# Patient Record
Sex: Male | Born: 1945
Health system: Southern US, Community
[De-identification: ages and names within clinical notes are randomized; demographics above are authoritative.]

## PROBLEM LIST (undated history)

## (undated) DIAGNOSIS — Z22322 Carrier or suspected carrier of Methicillin resistant Staphylococcus aureus: Secondary | ICD-10-CM

## (undated) DIAGNOSIS — E119 Type 2 diabetes mellitus without complications: Secondary | ICD-10-CM

## (undated) DIAGNOSIS — I10 Essential (primary) hypertension: Secondary | ICD-10-CM

## (undated) DIAGNOSIS — E785 Hyperlipidemia, unspecified: Secondary | ICD-10-CM

## (undated) DIAGNOSIS — I4891 Unspecified atrial fibrillation: Secondary | ICD-10-CM

## (undated) HISTORY — DX: Essential (primary) hypertension: I10

## (undated) HISTORY — DX: Unspecified atrial fibrillation: I48.91

## (undated) HISTORY — DX: Hyperlipidemia, unspecified: E78.5

## (undated) HISTORY — DX: Carrier or suspected carrier of methicillin resistant Staphylococcus aureus: Z22.322

## (undated) HISTORY — PX: NO PAST SURGERIES: SHX2092

## (undated) HISTORY — DX: Type 2 diabetes mellitus without complications: E11.9

---

## 2013-10-19 DIAGNOSIS — E119 Type 2 diabetes mellitus without complications: Secondary | ICD-10-CM | POA: Diagnosis not present

## 2013-10-19 DIAGNOSIS — I10 Essential (primary) hypertension: Secondary | ICD-10-CM | POA: Diagnosis not present

## 2013-10-19 DIAGNOSIS — R0602 Shortness of breath: Secondary | ICD-10-CM | POA: Diagnosis not present

## 2013-10-19 DIAGNOSIS — I4891 Unspecified atrial fibrillation: Secondary | ICD-10-CM | POA: Diagnosis not present

## 2013-10-20 DIAGNOSIS — J90 Pleural effusion, not elsewhere classified: Secondary | ICD-10-CM | POA: Diagnosis not present

## 2013-10-20 DIAGNOSIS — M545 Low back pain, unspecified: Secondary | ICD-10-CM | POA: Diagnosis not present

## 2013-10-20 DIAGNOSIS — I10 Essential (primary) hypertension: Secondary | ICD-10-CM | POA: Diagnosis not present

## 2013-10-20 DIAGNOSIS — J9819 Other pulmonary collapse: Secondary | ICD-10-CM | POA: Diagnosis not present

## 2013-10-20 DIAGNOSIS — I311 Chronic constrictive pericarditis: Secondary | ICD-10-CM | POA: Diagnosis not present

## 2013-10-20 DIAGNOSIS — E119 Type 2 diabetes mellitus without complications: Secondary | ICD-10-CM | POA: Diagnosis not present

## 2013-10-20 DIAGNOSIS — E785 Hyperlipidemia, unspecified: Secondary | ICD-10-CM | POA: Diagnosis not present

## 2013-10-20 DIAGNOSIS — I319 Disease of pericardium, unspecified: Secondary | ICD-10-CM | POA: Diagnosis not present

## 2013-10-20 DIAGNOSIS — R0602 Shortness of breath: Secondary | ICD-10-CM | POA: Diagnosis not present

## 2013-10-20 DIAGNOSIS — I4891 Unspecified atrial fibrillation: Secondary | ICD-10-CM | POA: Diagnosis not present

## 2013-10-21 DIAGNOSIS — R0602 Shortness of breath: Secondary | ICD-10-CM | POA: Diagnosis not present

## 2013-10-21 DIAGNOSIS — I311 Chronic constrictive pericarditis: Secondary | ICD-10-CM | POA: Diagnosis not present

## 2013-10-21 DIAGNOSIS — Z0000 Encounter for general adult medical examination without abnormal findings: Secondary | ICD-10-CM | POA: Diagnosis not present

## 2013-10-21 DIAGNOSIS — I4891 Unspecified atrial fibrillation: Secondary | ICD-10-CM | POA: Diagnosis not present

## 2013-10-21 DIAGNOSIS — J90 Pleural effusion, not elsewhere classified: Secondary | ICD-10-CM | POA: Diagnosis not present

## 2013-10-22 DIAGNOSIS — J90 Pleural effusion, not elsewhere classified: Secondary | ICD-10-CM | POA: Diagnosis not present

## 2013-10-22 DIAGNOSIS — E119 Type 2 diabetes mellitus without complications: Secondary | ICD-10-CM | POA: Diagnosis not present

## 2013-10-22 DIAGNOSIS — Z794 Long term (current) use of insulin: Secondary | ICD-10-CM | POA: Diagnosis not present

## 2013-10-22 DIAGNOSIS — Z7982 Long term (current) use of aspirin: Secondary | ICD-10-CM | POA: Diagnosis not present

## 2013-10-22 DIAGNOSIS — Z79899 Other long term (current) drug therapy: Secondary | ICD-10-CM | POA: Diagnosis not present

## 2013-10-22 DIAGNOSIS — Z9889 Other specified postprocedural states: Secondary | ICD-10-CM | POA: Diagnosis not present

## 2013-10-22 DIAGNOSIS — I4891 Unspecified atrial fibrillation: Secondary | ICD-10-CM | POA: Diagnosis not present

## 2013-10-22 DIAGNOSIS — I311 Chronic constrictive pericarditis: Secondary | ICD-10-CM | POA: Diagnosis not present

## 2013-10-22 DIAGNOSIS — R899 Unspecified abnormal finding in specimens from other organs, systems and tissues: Secondary | ICD-10-CM | POA: Diagnosis not present

## 2013-10-22 DIAGNOSIS — J9819 Other pulmonary collapse: Secondary | ICD-10-CM | POA: Diagnosis not present

## 2013-10-29 DIAGNOSIS — R0602 Shortness of breath: Secondary | ICD-10-CM | POA: Diagnosis not present

## 2013-10-29 DIAGNOSIS — J90 Pleural effusion, not elsewhere classified: Secondary | ICD-10-CM | POA: Diagnosis not present

## 2013-10-29 DIAGNOSIS — I4891 Unspecified atrial fibrillation: Secondary | ICD-10-CM | POA: Diagnosis not present

## 2013-11-23 DIAGNOSIS — J90 Pleural effusion, not elsewhere classified: Secondary | ICD-10-CM | POA: Diagnosis not present

## 2013-11-23 DIAGNOSIS — I4891 Unspecified atrial fibrillation: Secondary | ICD-10-CM | POA: Diagnosis not present

## 2013-11-23 DIAGNOSIS — I319 Disease of pericardium, unspecified: Secondary | ICD-10-CM | POA: Diagnosis not present

## 2013-11-23 DIAGNOSIS — E119 Type 2 diabetes mellitus without complications: Secondary | ICD-10-CM | POA: Diagnosis not present

## 2013-11-23 DIAGNOSIS — E785 Hyperlipidemia, unspecified: Secondary | ICD-10-CM | POA: Diagnosis not present

## 2013-11-24 DIAGNOSIS — I4891 Unspecified atrial fibrillation: Secondary | ICD-10-CM | POA: Diagnosis not present

## 2013-11-29 DIAGNOSIS — I4891 Unspecified atrial fibrillation: Secondary | ICD-10-CM | POA: Diagnosis not present

## 2013-11-29 DIAGNOSIS — I10 Essential (primary) hypertension: Secondary | ICD-10-CM | POA: Diagnosis not present

## 2013-12-13 DIAGNOSIS — I10 Essential (primary) hypertension: Secondary | ICD-10-CM | POA: Diagnosis not present

## 2013-12-13 DIAGNOSIS — I4891 Unspecified atrial fibrillation: Secondary | ICD-10-CM | POA: Diagnosis not present

## 2014-01-03 DIAGNOSIS — I4891 Unspecified atrial fibrillation: Secondary | ICD-10-CM | POA: Diagnosis not present

## 2014-01-03 DIAGNOSIS — E039 Hypothyroidism, unspecified: Secondary | ICD-10-CM | POA: Diagnosis not present

## 2014-01-03 DIAGNOSIS — I10 Essential (primary) hypertension: Secondary | ICD-10-CM | POA: Diagnosis not present

## 2014-01-09 DIAGNOSIS — H00019 Hordeolum externum unspecified eye, unspecified eyelid: Secondary | ICD-10-CM | POA: Diagnosis not present

## 2014-02-01 DIAGNOSIS — E119 Type 2 diabetes mellitus without complications: Secondary | ICD-10-CM | POA: Diagnosis not present

## 2014-02-01 DIAGNOSIS — J90 Pleural effusion, not elsewhere classified: Secondary | ICD-10-CM | POA: Diagnosis not present

## 2014-02-01 DIAGNOSIS — I4891 Unspecified atrial fibrillation: Secondary | ICD-10-CM | POA: Diagnosis not present

## 2014-02-01 DIAGNOSIS — I10 Essential (primary) hypertension: Secondary | ICD-10-CM | POA: Diagnosis not present

## 2014-02-01 DIAGNOSIS — E785 Hyperlipidemia, unspecified: Secondary | ICD-10-CM | POA: Diagnosis not present

## 2014-03-04 DIAGNOSIS — E119 Type 2 diabetes mellitus without complications: Secondary | ICD-10-CM | POA: Diagnosis not present

## 2014-03-04 DIAGNOSIS — H2510 Age-related nuclear cataract, unspecified eye: Secondary | ICD-10-CM | POA: Diagnosis not present

## 2014-03-04 DIAGNOSIS — H25019 Cortical age-related cataract, unspecified eye: Secondary | ICD-10-CM | POA: Diagnosis not present

## 2014-03-04 DIAGNOSIS — Z79899 Other long term (current) drug therapy: Secondary | ICD-10-CM | POA: Diagnosis not present

## 2014-04-06 DIAGNOSIS — E119 Type 2 diabetes mellitus without complications: Secondary | ICD-10-CM | POA: Diagnosis not present

## 2014-04-06 DIAGNOSIS — I4891 Unspecified atrial fibrillation: Secondary | ICD-10-CM | POA: Diagnosis not present

## 2014-04-06 DIAGNOSIS — I10 Essential (primary) hypertension: Secondary | ICD-10-CM | POA: Diagnosis not present

## 2014-05-30 DIAGNOSIS — I4891 Unspecified atrial fibrillation: Secondary | ICD-10-CM | POA: Diagnosis not present

## 2014-05-30 DIAGNOSIS — J90 Pleural effusion, not elsewhere classified: Secondary | ICD-10-CM | POA: Diagnosis not present

## 2014-07-14 DIAGNOSIS — I10 Essential (primary) hypertension: Secondary | ICD-10-CM | POA: Diagnosis not present

## 2014-07-14 DIAGNOSIS — E785 Hyperlipidemia, unspecified: Secondary | ICD-10-CM | POA: Diagnosis not present

## 2014-07-14 DIAGNOSIS — E119 Type 2 diabetes mellitus without complications: Secondary | ICD-10-CM | POA: Diagnosis not present

## 2014-08-02 DIAGNOSIS — M238X9 Other internal derangements of unspecified knee: Secondary | ICD-10-CM | POA: Diagnosis not present

## 2014-08-12 DIAGNOSIS — I10 Essential (primary) hypertension: Secondary | ICD-10-CM | POA: Diagnosis not present

## 2014-08-12 DIAGNOSIS — I319 Disease of pericardium, unspecified: Secondary | ICD-10-CM | POA: Diagnosis not present

## 2014-08-12 DIAGNOSIS — E785 Hyperlipidemia, unspecified: Secondary | ICD-10-CM | POA: Diagnosis not present

## 2014-08-12 DIAGNOSIS — I4891 Unspecified atrial fibrillation: Secondary | ICD-10-CM | POA: Diagnosis not present

## 2014-08-12 DIAGNOSIS — E119 Type 2 diabetes mellitus without complications: Secondary | ICD-10-CM | POA: Diagnosis not present

## 2014-10-03 DIAGNOSIS — Z1389 Encounter for screening for other disorder: Secondary | ICD-10-CM | POA: Diagnosis not present

## 2014-10-03 DIAGNOSIS — E1140 Type 2 diabetes mellitus with diabetic neuropathy, unspecified: Secondary | ICD-10-CM | POA: Diagnosis not present

## 2014-10-03 DIAGNOSIS — I4891 Unspecified atrial fibrillation: Secondary | ICD-10-CM | POA: Diagnosis not present

## 2014-10-03 DIAGNOSIS — E039 Hypothyroidism, unspecified: Secondary | ICD-10-CM | POA: Diagnosis not present

## 2014-10-03 DIAGNOSIS — I10 Essential (primary) hypertension: Secondary | ICD-10-CM | POA: Diagnosis not present

## 2014-10-03 DIAGNOSIS — R3911 Hesitancy of micturition: Secondary | ICD-10-CM | POA: Diagnosis not present

## 2014-10-03 DIAGNOSIS — Z23 Encounter for immunization: Secondary | ICD-10-CM | POA: Diagnosis not present

## 2015-01-11 DIAGNOSIS — I482 Chronic atrial fibrillation: Secondary | ICD-10-CM | POA: Diagnosis not present

## 2015-01-11 DIAGNOSIS — E1165 Type 2 diabetes mellitus with hyperglycemia: Secondary | ICD-10-CM | POA: Diagnosis not present

## 2015-01-11 DIAGNOSIS — E039 Hypothyroidism, unspecified: Secondary | ICD-10-CM | POA: Diagnosis not present

## 2015-03-15 DIAGNOSIS — R001 Bradycardia, unspecified: Secondary | ICD-10-CM | POA: Diagnosis not present

## 2015-03-15 DIAGNOSIS — I480 Paroxysmal atrial fibrillation: Secondary | ICD-10-CM | POA: Diagnosis not present

## 2015-03-15 DIAGNOSIS — E119 Type 2 diabetes mellitus without complications: Secondary | ICD-10-CM | POA: Diagnosis not present

## 2015-03-27 DIAGNOSIS — I4891 Unspecified atrial fibrillation: Secondary | ICD-10-CM | POA: Diagnosis not present

## 2015-03-29 DIAGNOSIS — I739 Peripheral vascular disease, unspecified: Secondary | ICD-10-CM | POA: Diagnosis not present

## 2015-03-29 DIAGNOSIS — I481 Persistent atrial fibrillation: Secondary | ICD-10-CM | POA: Diagnosis not present

## 2015-03-29 DIAGNOSIS — R001 Bradycardia, unspecified: Secondary | ICD-10-CM | POA: Diagnosis not present

## 2015-03-29 DIAGNOSIS — I491 Atrial premature depolarization: Secondary | ICD-10-CM | POA: Diagnosis not present

## 2015-04-12 DIAGNOSIS — E119 Type 2 diabetes mellitus without complications: Secondary | ICD-10-CM | POA: Diagnosis not present

## 2015-04-12 DIAGNOSIS — I4891 Unspecified atrial fibrillation: Secondary | ICD-10-CM | POA: Diagnosis not present

## 2015-04-13 DIAGNOSIS — L03011 Cellulitis of right finger: Secondary | ICD-10-CM | POA: Diagnosis not present

## 2015-04-18 DIAGNOSIS — L0390 Cellulitis, unspecified: Secondary | ICD-10-CM | POA: Diagnosis not present

## 2015-04-18 DIAGNOSIS — T148 Other injury of unspecified body region: Secondary | ICD-10-CM | POA: Diagnosis not present

## 2015-04-19 DIAGNOSIS — T148 Other injury of unspecified body region: Secondary | ICD-10-CM | POA: Diagnosis not present

## 2015-04-19 DIAGNOSIS — L0390 Cellulitis, unspecified: Secondary | ICD-10-CM | POA: Diagnosis not present

## 2015-04-19 DIAGNOSIS — S61031A Puncture wound without foreign body of right thumb without damage to nail, initial encounter: Secondary | ICD-10-CM | POA: Diagnosis not present

## 2015-04-19 DIAGNOSIS — M19041 Primary osteoarthritis, right hand: Secondary | ICD-10-CM | POA: Diagnosis not present

## 2015-04-20 DIAGNOSIS — L0390 Cellulitis, unspecified: Secondary | ICD-10-CM | POA: Diagnosis not present

## 2015-04-20 DIAGNOSIS — M795 Residual foreign body in soft tissue: Secondary | ICD-10-CM | POA: Diagnosis not present

## 2015-04-24 DIAGNOSIS — S61021A Laceration with foreign body of right thumb without damage to nail, initial encounter: Secondary | ICD-10-CM | POA: Diagnosis not present

## 2015-05-15 DIAGNOSIS — E119 Type 2 diabetes mellitus without complications: Secondary | ICD-10-CM

## 2015-05-15 DIAGNOSIS — E7800 Pure hypercholesterolemia, unspecified: Secondary | ICD-10-CM

## 2015-05-15 DIAGNOSIS — I480 Paroxysmal atrial fibrillation: Secondary | ICD-10-CM

## 2015-05-15 DIAGNOSIS — I10 Essential (primary) hypertension: Secondary | ICD-10-CM | POA: Insufficient documentation

## 2015-05-15 HISTORY — DX: Pure hypercholesterolemia, unspecified: E78.00

## 2015-05-15 HISTORY — DX: Essential (primary) hypertension: I10

## 2015-05-15 HISTORY — DX: Type 2 diabetes mellitus without complications: E11.9

## 2015-05-15 HISTORY — DX: Paroxysmal atrial fibrillation: I48.0

## 2015-07-13 DIAGNOSIS — I4891 Unspecified atrial fibrillation: Secondary | ICD-10-CM | POA: Diagnosis not present

## 2015-07-13 DIAGNOSIS — I10 Essential (primary) hypertension: Secondary | ICD-10-CM | POA: Diagnosis not present

## 2015-07-13 DIAGNOSIS — E1165 Type 2 diabetes mellitus with hyperglycemia: Secondary | ICD-10-CM | POA: Diagnosis not present

## 2015-07-17 DIAGNOSIS — Z794 Long term (current) use of insulin: Secondary | ICD-10-CM | POA: Diagnosis not present

## 2015-08-17 DIAGNOSIS — E11329 Type 2 diabetes mellitus with mild nonproliferative diabetic retinopathy without macular edema: Secondary | ICD-10-CM | POA: Diagnosis not present

## 2015-08-23 DIAGNOSIS — J0100 Acute maxillary sinusitis, unspecified: Secondary | ICD-10-CM | POA: Diagnosis not present

## 2015-10-26 DIAGNOSIS — E7800 Pure hypercholesterolemia, unspecified: Secondary | ICD-10-CM | POA: Diagnosis not present

## 2015-10-26 DIAGNOSIS — E119 Type 2 diabetes mellitus without complications: Secondary | ICD-10-CM | POA: Diagnosis not present

## 2015-10-26 DIAGNOSIS — I10 Essential (primary) hypertension: Secondary | ICD-10-CM | POA: Diagnosis not present

## 2015-10-26 DIAGNOSIS — I480 Paroxysmal atrial fibrillation: Secondary | ICD-10-CM | POA: Diagnosis not present

## 2015-10-26 DIAGNOSIS — Z794 Long term (current) use of insulin: Secondary | ICD-10-CM | POA: Diagnosis not present

## 2015-10-30 DIAGNOSIS — Z1389 Encounter for screening for other disorder: Secondary | ICD-10-CM | POA: Diagnosis not present

## 2015-10-30 DIAGNOSIS — E1140 Type 2 diabetes mellitus with diabetic neuropathy, unspecified: Secondary | ICD-10-CM | POA: Diagnosis not present

## 2015-10-30 DIAGNOSIS — I10 Essential (primary) hypertension: Secondary | ICD-10-CM | POA: Diagnosis not present

## 2015-10-30 DIAGNOSIS — R3911 Hesitancy of micturition: Secondary | ICD-10-CM | POA: Diagnosis not present

## 2015-10-30 DIAGNOSIS — I4891 Unspecified atrial fibrillation: Secondary | ICD-10-CM | POA: Diagnosis not present

## 2015-10-30 DIAGNOSIS — Z23 Encounter for immunization: Secondary | ICD-10-CM | POA: Diagnosis not present

## 2015-10-30 DIAGNOSIS — E11319 Type 2 diabetes mellitus with unspecified diabetic retinopathy without macular edema: Secondary | ICD-10-CM | POA: Diagnosis not present

## 2015-10-30 DIAGNOSIS — Z1212 Encounter for screening for malignant neoplasm of rectum: Secondary | ICD-10-CM | POA: Diagnosis not present

## 2015-10-30 DIAGNOSIS — E039 Hypothyroidism, unspecified: Secondary | ICD-10-CM | POA: Diagnosis not present

## 2015-10-30 DIAGNOSIS — E7800 Pure hypercholesterolemia, unspecified: Secondary | ICD-10-CM | POA: Diagnosis not present

## 2015-12-10 DIAGNOSIS — J0100 Acute maxillary sinusitis, unspecified: Secondary | ICD-10-CM | POA: Diagnosis not present

## 2016-02-05 DIAGNOSIS — I10 Essential (primary) hypertension: Secondary | ICD-10-CM | POA: Diagnosis not present

## 2016-02-05 DIAGNOSIS — E119 Type 2 diabetes mellitus without complications: Secondary | ICD-10-CM | POA: Diagnosis not present

## 2016-02-05 DIAGNOSIS — E039 Hypothyroidism, unspecified: Secondary | ICD-10-CM | POA: Diagnosis not present

## 2016-02-05 DIAGNOSIS — E1140 Type 2 diabetes mellitus with diabetic neuropathy, unspecified: Secondary | ICD-10-CM | POA: Diagnosis not present

## 2016-02-05 DIAGNOSIS — R739 Hyperglycemia, unspecified: Secondary | ICD-10-CM | POA: Diagnosis not present

## 2016-02-22 DIAGNOSIS — E1140 Type 2 diabetes mellitus with diabetic neuropathy, unspecified: Secondary | ICD-10-CM | POA: Diagnosis not present

## 2016-04-04 DIAGNOSIS — Z6829 Body mass index (BMI) 29.0-29.9, adult: Secondary | ICD-10-CM | POA: Diagnosis not present

## 2016-04-04 DIAGNOSIS — E1140 Type 2 diabetes mellitus with diabetic neuropathy, unspecified: Secondary | ICD-10-CM | POA: Diagnosis not present

## 2016-05-08 DIAGNOSIS — E1040 Type 1 diabetes mellitus with diabetic neuropathy, unspecified: Secondary | ICD-10-CM | POA: Diagnosis not present

## 2016-05-08 DIAGNOSIS — E1065 Type 1 diabetes mellitus with hyperglycemia: Secondary | ICD-10-CM | POA: Diagnosis not present

## 2016-05-08 DIAGNOSIS — Z6829 Body mass index (BMI) 29.0-29.9, adult: Secondary | ICD-10-CM | POA: Diagnosis not present

## 2016-05-09 DIAGNOSIS — E1165 Type 2 diabetes mellitus with hyperglycemia: Secondary | ICD-10-CM | POA: Diagnosis not present

## 2016-05-23 DIAGNOSIS — I10 Essential (primary) hypertension: Secondary | ICD-10-CM | POA: Diagnosis not present

## 2016-05-23 DIAGNOSIS — E119 Type 2 diabetes mellitus without complications: Secondary | ICD-10-CM | POA: Diagnosis not present

## 2016-05-23 DIAGNOSIS — I480 Paroxysmal atrial fibrillation: Secondary | ICD-10-CM | POA: Diagnosis not present

## 2016-05-23 DIAGNOSIS — E7800 Pure hypercholesterolemia, unspecified: Secondary | ICD-10-CM | POA: Diagnosis not present

## 2016-06-11 DIAGNOSIS — R0981 Nasal congestion: Secondary | ICD-10-CM | POA: Diagnosis not present

## 2016-06-11 DIAGNOSIS — J019 Acute sinusitis, unspecified: Secondary | ICD-10-CM | POA: Diagnosis not present

## 2016-06-11 DIAGNOSIS — R05 Cough: Secondary | ICD-10-CM | POA: Diagnosis not present

## 2016-06-20 DIAGNOSIS — Z794 Long term (current) use of insulin: Secondary | ICD-10-CM | POA: Diagnosis not present

## 2016-06-20 DIAGNOSIS — E119 Type 2 diabetes mellitus without complications: Secondary | ICD-10-CM | POA: Diagnosis not present

## 2016-06-24 DIAGNOSIS — E119 Type 2 diabetes mellitus without complications: Secondary | ICD-10-CM | POA: Diagnosis not present

## 2016-06-24 DIAGNOSIS — E7800 Pure hypercholesterolemia, unspecified: Secondary | ICD-10-CM | POA: Diagnosis not present

## 2016-06-24 DIAGNOSIS — I480 Paroxysmal atrial fibrillation: Secondary | ICD-10-CM | POA: Diagnosis not present

## 2016-06-24 DIAGNOSIS — I10 Essential (primary) hypertension: Secondary | ICD-10-CM | POA: Diagnosis not present

## 2016-07-19 DIAGNOSIS — Z79899 Other long term (current) drug therapy: Secondary | ICD-10-CM | POA: Diagnosis not present

## 2016-07-25 DIAGNOSIS — E1065 Type 1 diabetes mellitus with hyperglycemia: Secondary | ICD-10-CM | POA: Diagnosis not present

## 2016-07-25 DIAGNOSIS — E108 Type 1 diabetes mellitus with unspecified complications: Secondary | ICD-10-CM | POA: Diagnosis not present

## 2016-07-25 DIAGNOSIS — E1040 Type 1 diabetes mellitus with diabetic neuropathy, unspecified: Secondary | ICD-10-CM | POA: Diagnosis not present

## 2016-08-06 DIAGNOSIS — E133299 Other specified diabetes mellitus with mild nonproliferative diabetic retinopathy without macular edema, unspecified eye: Secondary | ICD-10-CM | POA: Diagnosis not present

## 2016-08-06 DIAGNOSIS — H25813 Combined forms of age-related cataract, bilateral: Secondary | ICD-10-CM | POA: Diagnosis not present

## 2016-08-14 DIAGNOSIS — I10 Essential (primary) hypertension: Secondary | ICD-10-CM | POA: Diagnosis not present

## 2016-08-14 DIAGNOSIS — Z23 Encounter for immunization: Secondary | ICD-10-CM | POA: Diagnosis not present

## 2016-08-14 DIAGNOSIS — E119 Type 2 diabetes mellitus without complications: Secondary | ICD-10-CM | POA: Diagnosis not present

## 2016-08-14 DIAGNOSIS — I4891 Unspecified atrial fibrillation: Secondary | ICD-10-CM | POA: Diagnosis not present

## 2016-09-03 DIAGNOSIS — E108 Type 1 diabetes mellitus with unspecified complications: Secondary | ICD-10-CM | POA: Diagnosis not present

## 2016-09-03 DIAGNOSIS — E1065 Type 1 diabetes mellitus with hyperglycemia: Secondary | ICD-10-CM | POA: Diagnosis not present

## 2016-09-03 DIAGNOSIS — Z6829 Body mass index (BMI) 29.0-29.9, adult: Secondary | ICD-10-CM | POA: Diagnosis not present

## 2016-09-03 DIAGNOSIS — E1040 Type 1 diabetes mellitus with diabetic neuropathy, unspecified: Secondary | ICD-10-CM | POA: Diagnosis not present

## 2016-10-07 DIAGNOSIS — E119 Type 2 diabetes mellitus without complications: Secondary | ICD-10-CM | POA: Diagnosis not present

## 2016-10-07 DIAGNOSIS — E1065 Type 1 diabetes mellitus with hyperglycemia: Secondary | ICD-10-CM | POA: Diagnosis not present

## 2016-10-07 DIAGNOSIS — Z794 Long term (current) use of insulin: Secondary | ICD-10-CM | POA: Diagnosis not present

## 2016-10-07 DIAGNOSIS — E1040 Type 1 diabetes mellitus with diabetic neuropathy, unspecified: Secondary | ICD-10-CM | POA: Diagnosis not present

## 2016-10-07 DIAGNOSIS — E108 Type 1 diabetes mellitus with unspecified complications: Secondary | ICD-10-CM | POA: Diagnosis not present

## 2016-11-01 DIAGNOSIS — E7800 Pure hypercholesterolemia, unspecified: Secondary | ICD-10-CM | POA: Diagnosis not present

## 2016-11-01 DIAGNOSIS — I10 Essential (primary) hypertension: Secondary | ICD-10-CM | POA: Diagnosis not present

## 2016-11-01 DIAGNOSIS — Z1389 Encounter for screening for other disorder: Secondary | ICD-10-CM | POA: Diagnosis not present

## 2016-11-01 DIAGNOSIS — E10319 Type 1 diabetes mellitus with unspecified diabetic retinopathy without macular edema: Secondary | ICD-10-CM | POA: Diagnosis not present

## 2016-11-01 DIAGNOSIS — R3911 Hesitancy of micturition: Secondary | ICD-10-CM | POA: Diagnosis not present

## 2016-11-01 DIAGNOSIS — E039 Hypothyroidism, unspecified: Secondary | ICD-10-CM | POA: Diagnosis not present

## 2016-11-01 DIAGNOSIS — I4891 Unspecified atrial fibrillation: Secondary | ICD-10-CM | POA: Diagnosis not present

## 2016-11-01 DIAGNOSIS — Z6830 Body mass index (BMI) 30.0-30.9, adult: Secondary | ICD-10-CM | POA: Diagnosis not present

## 2016-11-01 DIAGNOSIS — E1040 Type 1 diabetes mellitus with diabetic neuropathy, unspecified: Secondary | ICD-10-CM | POA: Diagnosis not present

## 2016-11-04 DIAGNOSIS — E1065 Type 1 diabetes mellitus with hyperglycemia: Secondary | ICD-10-CM | POA: Diagnosis not present

## 2016-11-04 DIAGNOSIS — Z6829 Body mass index (BMI) 29.0-29.9, adult: Secondary | ICD-10-CM | POA: Diagnosis not present

## 2016-11-04 DIAGNOSIS — E1040 Type 1 diabetes mellitus with diabetic neuropathy, unspecified: Secondary | ICD-10-CM | POA: Diagnosis not present

## 2016-11-04 DIAGNOSIS — E108 Type 1 diabetes mellitus with unspecified complications: Secondary | ICD-10-CM | POA: Diagnosis not present

## 2017-01-06 DIAGNOSIS — Z6829 Body mass index (BMI) 29.0-29.9, adult: Secondary | ICD-10-CM | POA: Diagnosis not present

## 2017-01-06 DIAGNOSIS — E1040 Type 1 diabetes mellitus with diabetic neuropathy, unspecified: Secondary | ICD-10-CM | POA: Diagnosis not present

## 2017-01-06 DIAGNOSIS — E1065 Type 1 diabetes mellitus with hyperglycemia: Secondary | ICD-10-CM | POA: Diagnosis not present

## 2017-01-06 DIAGNOSIS — E108 Type 1 diabetes mellitus with unspecified complications: Secondary | ICD-10-CM | POA: Diagnosis not present

## 2017-01-10 DIAGNOSIS — J329 Chronic sinusitis, unspecified: Secondary | ICD-10-CM | POA: Diagnosis not present

## 2017-01-20 DIAGNOSIS — E1065 Type 1 diabetes mellitus with hyperglycemia: Secondary | ICD-10-CM | POA: Diagnosis not present

## 2017-01-20 DIAGNOSIS — E108 Type 1 diabetes mellitus with unspecified complications: Secondary | ICD-10-CM | POA: Diagnosis not present

## 2017-01-20 DIAGNOSIS — E1040 Type 1 diabetes mellitus with diabetic neuropathy, unspecified: Secondary | ICD-10-CM | POA: Diagnosis not present

## 2017-02-05 DIAGNOSIS — E1040 Type 1 diabetes mellitus with diabetic neuropathy, unspecified: Secondary | ICD-10-CM | POA: Diagnosis not present

## 2017-03-05 DIAGNOSIS — E1040 Type 1 diabetes mellitus with diabetic neuropathy, unspecified: Secondary | ICD-10-CM | POA: Diagnosis not present

## 2017-03-05 DIAGNOSIS — E1065 Type 1 diabetes mellitus with hyperglycemia: Secondary | ICD-10-CM | POA: Diagnosis not present

## 2017-04-28 DIAGNOSIS — E1040 Type 1 diabetes mellitus with diabetic neuropathy, unspecified: Secondary | ICD-10-CM | POA: Diagnosis not present

## 2017-04-28 DIAGNOSIS — E1065 Type 1 diabetes mellitus with hyperglycemia: Secondary | ICD-10-CM | POA: Diagnosis not present

## 2017-05-08 DIAGNOSIS — E1165 Type 2 diabetes mellitus with hyperglycemia: Secondary | ICD-10-CM | POA: Diagnosis not present

## 2017-05-08 DIAGNOSIS — E1140 Type 2 diabetes mellitus with diabetic neuropathy, unspecified: Secondary | ICD-10-CM | POA: Diagnosis not present

## 2017-05-08 DIAGNOSIS — I10 Essential (primary) hypertension: Secondary | ICD-10-CM | POA: Diagnosis not present

## 2017-05-19 NOTE — Progress Notes (Unsigned)
unc 

## 2017-05-22 ENCOUNTER — Ambulatory Visit (INDEPENDENT_AMBULATORY_CARE_PROVIDER_SITE_OTHER): Payer: Medicare Other | Admitting: Cardiology

## 2017-05-22 ENCOUNTER — Encounter: Payer: Self-pay | Admitting: Cardiology

## 2017-05-22 VITALS — BP 136/70 | HR 56 | Resp 10 | Ht 69.0 in | Wt 208.8 lb

## 2017-05-22 DIAGNOSIS — I480 Paroxysmal atrial fibrillation: Secondary | ICD-10-CM | POA: Diagnosis not present

## 2017-05-22 DIAGNOSIS — E7800 Pure hypercholesterolemia, unspecified: Secondary | ICD-10-CM

## 2017-05-22 DIAGNOSIS — I10 Essential (primary) hypertension: Secondary | ICD-10-CM | POA: Diagnosis not present

## 2017-05-22 DIAGNOSIS — E119 Type 2 diabetes mellitus without complications: Secondary | ICD-10-CM

## 2017-05-22 NOTE — Addendum Note (Signed)
Addended by: Kathyrn Sheriff on: 05/22/2017 09:55 AM   Modules accepted: Orders

## 2017-05-22 NOTE — Progress Notes (Signed)
Cardiology Office Note:    Date:  05/22/2017   ID:  Jeremiah Sheriff., DOB 1946-08-30, MRN [id number]  PCP:  [name], MD  Cardiologist:  [name], MD    Referring MD: No ref. provider found   Chief Complaint  Patient presents with  . Follow-up  Paroxysmal atrial fibrillation  History of Present Illness:    Jeremiah Rubenstein. is a 72 y.o. male  with paroxysmal atrial fibrillation, diabetes, hypertension. Overall he is doing well, denies having chest pain tightness squeezing pressure bradycardia and chest. He is active he does wear fitbit, does not see any significant unexplained tachycardia, but didn't keep up with 10,000 steps a day.  Past Medical History:  Diagnosis Date  . Atrial fibrillation ([place])   . Diabetes mellitus without complication ([place])   . Hyperlipidemia   . Hypertension     Past Surgical History:  Procedure Laterality Date  . NO PAST SURGERIES      Current Medications: Current Meds  Medication Sig  . amiodarone (PACERONE) 200 MG tablet Take 0.5 tablets by mouth daily.  Marland Kitchen amLODipine (NORVASC) 10 MG tablet Take 10 mg by mouth daily.  Marland Kitchen atorvastatin (LIPITOR) 40 MG tablet Take 40 mg by mouth daily.  . furosemide (LASIX) 20 MG tablet Take 20 mg by mouth daily.  . hydrALAZINE (APRESOLINE) 50 MG tablet Take 50 mg by mouth daily.  . insulin aspart protamine - aspart (NOVOLOG 70/30 MIX) (70-30) 100 UNIT/ML FlexPen Inject 10 Units into the skin daily.  Marland Kitchen levothyroxine (SYNTHROID, LEVOTHROID) 75 MCG tablet Take 1 tablet by mouth daily.  . quinapril (ACCUPRIL) 40 MG tablet Take 40 mg by mouth daily.  . rivaroxaban (XARELTO) 20 MG TABS tablet Take 1 tablet by mouth daily.     Allergies:   Patient has no known allergies.   Social History   Social History  . Marital status: Married    Spouse name: N/A  . Number of children: N/A  . Years of education: N/A   Social History Main Topics  . Smoking status: Never Smoker  . Smokeless tobacco: Current  User  . Alcohol use No  . Drug use: No  . Sexual activity: Not Asked   Other Topics Concern  . None   Social History Narrative  . None     Family History: The patient's family history includes Heart attack in his father; Hyperlipidemia in his father; Stroke in his father. ROS:   Please see the history of present illness.     All other systems reviewed and are negative.  EKGs/Labs/Other Studies Reviewed:      Recent Labs: No results found for requested labs within last 8760 hours.  Recent Lipid Panel No results found for: CHOL, TRIG, HDL, CHOLHDL, VLDL, LDLCALC, LDLDIRECT  Physical Exam:    VS:  BP 136/70   Pulse (!) 56   Resp 10   Ht 5\' 9"  (1.753 m)   Wt 208 lb 12.8 oz (94.7 kg)   BMI 30.83 kg/m     Wt Readings from Last 3 Encounters:  05/22/17 208 lb 12.8 oz (94.7 kg)     GEN:  Well nourished, well developed in no acute distress HEENT: Normal NECK: No JVD; No carotid bruits LYMPHATICS: No lymphadenopathy CARDIAC: RRR, no murmurs, no rubs, no gallops RESPIRATORY:  Clear to auscultation without rales, wheezing or rhonchi  ABDOMEN: Soft, non-tender, non-distended MUSCULOSKELETAL:  No edema; No deformity  SKIN: Warm and dry LOWER EXTREMITIES: no swelling NEUROLOGIC:  Alert  and oriented x 3 PSYCHIATRIC:  Normal affect   ASSESSMENT:    1. Essential hypertension   2. Paroxysmal atrial fibrillation (HCC)   3. Type 2 diabetes mellitus without complication, without long-term current use of insulin ([place])   4. Pure hypercholesterolemia    PLAN:    In order of problems listed above:  1. Paroxysmal atrial fibrillation: Obese to be stable. Will do EKG confirmed rhythm. He is taking amiodarone likely very small dose of however I asked him to have liver function tests checked as well as TSH checked. 2. Essential hypertension: Doctors well controlled we'll continue present management. 3. Diabetes mellitus: Follow-up by primary care physician about  stable. 4. Dyslipidemia: I will call primary care physician to get fasting lipid profile   Medication Adjustments/Labs and Tests Ordered: Current medicines are reviewed at length with the patient today.  Concerns regarding medicines are outlined above.  No orders of the defined types were placed in this encounter.  Medication changes: No orders of the defined types were placed in this encounter.   Signed, [name], MD  05/22/2017 9:26 AM    [facility]

## 2017-05-22 NOTE — Patient Instructions (Signed)
Medication Instructions:  Your physician recommends that you continue on your current medications as directed. Please refer to the Current Medication list given to you today.  Labwork: Your physician recommends that you return for lab work in: today    Testing/Procedures: ECG performed in office today  Follow-Up: Your physician recommends that you schedule a follow-up appointment in: 6 months    Any Other Special Instructions Will Be Listed Below (If Applicable). Please follow up with your primary care provider in regards to management of your Diabetes.     If you need a refill on your cardiac medications before your next appointment, please call your pharmacy.

## 2017-05-23 LAB — HEPATIC FUNCTION PANEL
ALBUMIN: 4.5 g/dL (ref 3.6–4.8)
ALK PHOS: 71 IU/L (ref 39–117)
ALT: 18 IU/L (ref 0–44)
AST: 17 IU/L (ref 0–40)
BILIRUBIN TOTAL: 0.5 mg/dL (ref 0.0–1.2)
Bilirubin, Direct: 0.17 mg/dL (ref 0.00–0.40)
TOTAL PROTEIN: 6.6 g/dL (ref 6.0–8.5)

## 2017-05-23 LAB — TSH: TSH: 4.2 u[IU]/mL (ref 0.450–4.500)

## 2017-06-09 ENCOUNTER — Other Ambulatory Visit: Payer: Self-pay

## 2017-06-20 ENCOUNTER — Other Ambulatory Visit: Payer: Self-pay | Admitting: Cardiology

## 2017-08-14 DIAGNOSIS — I10 Essential (primary) hypertension: Secondary | ICD-10-CM | POA: Diagnosis not present

## 2017-08-14 DIAGNOSIS — I4891 Unspecified atrial fibrillation: Secondary | ICD-10-CM | POA: Diagnosis not present

## 2017-08-14 DIAGNOSIS — E1040 Type 1 diabetes mellitus with diabetic neuropathy, unspecified: Secondary | ICD-10-CM | POA: Diagnosis not present

## 2017-09-24 DIAGNOSIS — E1065 Type 1 diabetes mellitus with hyperglycemia: Secondary | ICD-10-CM | POA: Diagnosis not present

## 2017-09-24 DIAGNOSIS — E1040 Type 1 diabetes mellitus with diabetic neuropathy, unspecified: Secondary | ICD-10-CM | POA: Diagnosis not present

## 2017-11-06 DIAGNOSIS — E7800 Pure hypercholesterolemia, unspecified: Secondary | ICD-10-CM | POA: Diagnosis not present

## 2017-11-06 DIAGNOSIS — E1040 Type 1 diabetes mellitus with diabetic neuropathy, unspecified: Secondary | ICD-10-CM | POA: Diagnosis not present

## 2017-11-06 DIAGNOSIS — Z6830 Body mass index (BMI) 30.0-30.9, adult: Secondary | ICD-10-CM | POA: Diagnosis not present

## 2017-11-06 DIAGNOSIS — E10319 Type 1 diabetes mellitus with unspecified diabetic retinopathy without macular edema: Secondary | ICD-10-CM | POA: Diagnosis not present

## 2017-11-06 DIAGNOSIS — E109 Type 1 diabetes mellitus without complications: Secondary | ICD-10-CM | POA: Diagnosis not present

## 2017-11-06 DIAGNOSIS — I10 Essential (primary) hypertension: Secondary | ICD-10-CM | POA: Diagnosis not present

## 2017-11-06 DIAGNOSIS — E039 Hypothyroidism, unspecified: Secondary | ICD-10-CM | POA: Diagnosis not present

## 2017-11-06 DIAGNOSIS — Z1389 Encounter for screening for other disorder: Secondary | ICD-10-CM | POA: Diagnosis not present

## 2017-11-06 DIAGNOSIS — R3911 Hesitancy of micturition: Secondary | ICD-10-CM | POA: Diagnosis not present

## 2017-11-06 DIAGNOSIS — I4891 Unspecified atrial fibrillation: Secondary | ICD-10-CM | POA: Diagnosis not present

## 2017-11-13 ENCOUNTER — Other Ambulatory Visit: Payer: Self-pay | Admitting: Cardiology

## 2017-11-26 ENCOUNTER — Ambulatory Visit: Payer: Medicare Other | Admitting: Cardiology

## 2017-11-27 ENCOUNTER — Other Ambulatory Visit: Payer: Self-pay

## 2017-11-27 ENCOUNTER — Encounter: Payer: Self-pay | Admitting: Cardiology

## 2017-11-27 ENCOUNTER — Ambulatory Visit (INDEPENDENT_AMBULATORY_CARE_PROVIDER_SITE_OTHER): Payer: Medicare Other | Admitting: Cardiology

## 2017-11-27 VITALS — BP 122/68 | HR 67 | Ht 69.0 in | Wt 209.0 lb

## 2017-11-27 DIAGNOSIS — E119 Type 2 diabetes mellitus without complications: Secondary | ICD-10-CM

## 2017-11-27 DIAGNOSIS — I480 Paroxysmal atrial fibrillation: Secondary | ICD-10-CM

## 2017-11-27 DIAGNOSIS — I10 Essential (primary) hypertension: Secondary | ICD-10-CM

## 2017-11-27 DIAGNOSIS — E7800 Pure hypercholesterolemia, unspecified: Secondary | ICD-10-CM

## 2017-11-27 NOTE — Progress Notes (Signed)
Cardiology Office Note:    Date:  11/27/2017   ID:  Jeremiah Sheriff., DOB 11-25-1945, MRN [id number]  PCP:  [name], MD  Cardiologist:  [name], MD    Referring MD: [name], [name], MD   No chief complaint on file. Doing well, no palpitations but had some chest pain  History of Present Illness:    Jeremiah Hallenbeck. is a 72 y.o. male with history of paroxysmal atrial fibrillation.  His chads 2 Vascor equals 3, he is anticoagulated which I will continue.  Overall doing well feeling well with no trouble however about 3 weeks ago he started experiencing left sided chest pain on the back.  It was worse with coughing and taking deep breath it is significantly better right now almost to the point that he does not feel it.  Did not have any shortness of breath he check his fit bit and he did not see any evidence of heart with irregularity of accelerated heart rate.  Past Medical History:  Diagnosis Date  . Atrial fibrillation ([place])   . Diabetes mellitus without complication ([place])   . Hyperlipidemia   . Hypertension     Past Surgical History:  Procedure Laterality Date  . NO PAST SURGERIES      Current Medications: Current Meds  Medication Sig  . amiodarone (PACERONE) 200 MG tablet TAKE 1/2 TABLET BY MOUTH DAILY AT 6 AM AS DIRECTED BY DOCTOR  . amLODipine (NORVASC) 10 MG tablet Take 10 mg by mouth daily.  Marland Kitchen atorvastatin (LIPITOR) 40 MG tablet Take 40 mg by mouth daily.  . furosemide (LASIX) 20 MG tablet TAKE ONE (1) TABLET BY MOUTH EVERY DAY.  . hydrALAZINE (APRESOLINE) 50 MG tablet Take 50 mg by mouth daily.  . insulin aspart protamine - aspart (NOVOLOG 70/30 MIX) (70-30) 100 UNIT/ML FlexPen Inject 10 Units into the skin daily.  Marland Kitchen levothyroxine (SYNTHROID, LEVOTHROID) 75 MCG tablet Take 1 tablet by mouth daily.  . quinapril (ACCUPRIL) 40 MG tablet Take 40 mg by mouth daily.  . rivaroxaban (XARELTO) 20 MG TABS tablet Take 1 tablet by mouth daily.     Allergies:    Patient has no known allergies.   Social History   Socioeconomic History  . Marital status: Married    Spouse name: Not on file  . Number of children: Not on file  . Years of education: Not on file  . Highest education level: Not on file  Social Needs  . Financial resource strain: Not on file  . Food insecurity - worry: Not on file  . Food insecurity - inability: Not on file  . Transportation needs - medical: Not on file  . Transportation needs - non-medical: Not on file  Occupational History  . Not on file  Tobacco Use  . Smoking status: Never Smoker  . Smokeless tobacco: Current User  Substance and Sexual Activity  . Alcohol use: No  . Drug use: No  . Sexual activity: Not on file  Other Topics Concern  . Not on file  Social History Narrative  . Not on file     Family History: The patient's family history includes Heart attack in his father; Hyperlipidemia in his father; Stroke in his father. ROS:   Please see the history of present illness.    All 14 point review of systems negative except as described per history of present illness  EKGs/Labs/Other Studies Reviewed:      Recent Labs: 05/22/2017: ALT 18; TSH 4.200  Recent Lipid Panel No results found for: CHOL, TRIG, HDL, CHOLHDL, VLDL, LDLCALC, LDLDIRECT  Physical Exam:    VS:  BP 122/68 (BP Location: Right Arm, Patient Position: Sitting, Cuff Size: Large)   Pulse 67   Ht 5\' 9"  (1.753 m)   Wt 209 lb 0.6 oz (94.8 kg)   SpO2 96%   BMI 30.87 kg/m     Wt Readings from Last 3 Encounters:  11/27/17 209 lb 0.6 oz (94.8 kg)  05/22/17 208 lb 12.8 oz (94.7 kg)     GEN:  Well nourished, well developed in no acute distress HEENT: Normal NECK: No JVD; No carotid bruits LYMPHATICS: No lymphadenopathy CARDIAC: RRR, no murmurs, no rubs, no gallops RESPIRATORY:  Clear to auscultation without rales, wheezing or rhonchi  ABDOMEN: Soft, non-tender, non-distended MUSCULOSKELETAL:  No edema; No deformity  SKIN: Warm  and dry LOWER EXTREMITIES: no swelling NEUROLOGIC:  Alert and oriented x 3 PSYCHIATRIC:  Normal affect   ASSESSMENT:    1. Essential hypertension   2. Paroxysmal atrial fibrillation (HCC)   3. Type 2 diabetes mellitus without complication, without long-term current use of insulin ([place])   4. Pure hypercholesterolemia    PLAN:    In order of problems listed above:  1. Essential hypertension: Blood pressure well controlled continue present management. 2. Paroxysmal atrial fibrillation: He is on amiodarone last thyroid function test as well as liver function tests were normal.  Continue anticoagulation.  Apparently no recurrences. 3. Type 2 diabetes: Followed by internal medicine team. 4. Dyslipidemia will call primary care physician to get copy of his fasting lipid profile. 5. The Chest pain.  No evidence of any pleural effusion based on my physical examination.  I told him if pain will happen again he need to let us know and chest x-ray may be even CT of his chest will be necessary   Medication Adjustments/Labs and Tests Ordered: Current medicines are reviewed at length with the patient today.  Concerns regarding medicines are outlined above.  No orders of the defined types were placed in this encounter.  Medication changes: No orders of the defined types were placed in this encounter.   Signed, Park Liter, MD, [facility] 11/27/2017 11:37 AM    [place]

## 2017-11-27 NOTE — Patient Instructions (Signed)
Medication Instructions:  Your physician recommends that you continue on your current medications as directed. Please refer to the Current Medication list given to you today.  Labwork: None orderd  Testing/Procedures: None Ordered  Follow-Up: Your physician recommends that you schedule a follow-up appointment in: 6 months with Dr. Agustin Cree in [place]   Any Other Special Instructions Will Be Listed Below (If Applicable).     If you need a refill on your cardiac medications before your next appointment, please call your pharmacy.

## 2017-12-23 ENCOUNTER — Other Ambulatory Visit: Payer: Self-pay | Admitting: Cardiology

## 2017-12-25 DIAGNOSIS — E1065 Type 1 diabetes mellitus with hyperglycemia: Secondary | ICD-10-CM | POA: Diagnosis not present

## 2018-02-04 DIAGNOSIS — E1065 Type 1 diabetes mellitus with hyperglycemia: Secondary | ICD-10-CM | POA: Diagnosis not present

## 2018-02-04 DIAGNOSIS — I10 Essential (primary) hypertension: Secondary | ICD-10-CM | POA: Diagnosis not present

## 2018-03-24 DIAGNOSIS — E1065 Type 1 diabetes mellitus with hyperglycemia: Secondary | ICD-10-CM | POA: Diagnosis not present

## 2018-03-24 DIAGNOSIS — Z794 Long term (current) use of insulin: Secondary | ICD-10-CM | POA: Diagnosis not present

## 2018-05-13 DIAGNOSIS — E1165 Type 2 diabetes mellitus with hyperglycemia: Secondary | ICD-10-CM | POA: Diagnosis not present

## 2018-05-13 DIAGNOSIS — I10 Essential (primary) hypertension: Secondary | ICD-10-CM | POA: Diagnosis not present

## 2018-06-16 ENCOUNTER — Ambulatory Visit (INDEPENDENT_AMBULATORY_CARE_PROVIDER_SITE_OTHER): Payer: Medicare Other | Admitting: Cardiology

## 2018-06-16 ENCOUNTER — Encounter: Payer: Self-pay | Admitting: Cardiology

## 2018-06-16 VITALS — BP 128/66 | HR 51 | Ht 69.0 in | Wt 210.0 lb

## 2018-06-16 DIAGNOSIS — I10 Essential (primary) hypertension: Secondary | ICD-10-CM | POA: Diagnosis not present

## 2018-06-16 DIAGNOSIS — I480 Paroxysmal atrial fibrillation: Secondary | ICD-10-CM | POA: Diagnosis not present

## 2018-06-16 DIAGNOSIS — E119 Type 2 diabetes mellitus without complications: Secondary | ICD-10-CM

## 2018-06-16 DIAGNOSIS — E7800 Pure hypercholesterolemia, unspecified: Secondary | ICD-10-CM

## 2018-06-16 NOTE — Progress Notes (Signed)
Cardiology Office Note:    Date:  06/16/2018   ID:  Jeremiah Sheriff., DOB August 08, 1946, MRN [id number]  PCP:  [name], MD  Cardiologist:  [name], MD    Referring MD: [name], [name], MD   No chief complaint on file. Doing well cardiac wise  History of Present Illness:    Jeremiah Stetzer. is a 72 y.o. male with paroxysmal atrial fibrillation, hypertension, diabetes.  Doing well cardiac wise no chest pain tightness squeezing pressure burning chest.  He is upset about the fact that he gained some weight and is trying to lose some.  He is committed to exercise walk on the regular basis which I strongly advised.  Past Medical History:  Diagnosis Date  . Atrial fibrillation ([place])   . Diabetes mellitus without complication ([place])   . Hyperlipidemia   . Hypertension     Past Surgical History:  Procedure Laterality Date  . NO PAST SURGERIES      Current Medications: Current Meds  Medication Sig  . amiodarone (PACERONE) 200 MG tablet TAKE 1/2 TABLET BY MOUTH DAILY AT 6 AM AS DIRECTED BY DOCTOR  . amLODipine (NORVASC) 10 MG tablet Take 10 mg by mouth daily.  Marland Kitchen atorvastatin (LIPITOR) 40 MG tablet Take 40 mg by mouth daily.  . furosemide (LASIX) 20 MG tablet TAKE ONE (1) TABLET BY MOUTH EVERY DAY.  . hydrALAZINE (APRESOLINE) 50 MG tablet Take 50 mg by mouth daily.  . insulin aspart protamine - aspart (NOVOLOG 70/30 MIX) (70-30) 100 UNIT/ML FlexPen Inject 10 Units into the skin daily.  Marland Kitchen levothyroxine (SYNTHROID, LEVOTHROID) 75 MCG tablet Take 1 tablet by mouth daily.  . quinapril (ACCUPRIL) 40 MG tablet Take 40 mg by mouth daily.  . rivaroxaban (XARELTO) 20 MG TABS tablet Take 1 tablet by mouth daily.     Allergies:   Patient has no known allergies.   Social History   Socioeconomic History  . Marital status: Married    Spouse name: Not on file  . Number of children: Not on file  . Years of education: Not on file  . Highest education level: Not on file    Occupational History  . Not on file  Social Needs  . Financial resource strain: Not on file  . Food insecurity:    Worry: Not on file    Inability: Not on file  . Transportation needs:    Medical: Not on file    Non-medical: Not on file  Tobacco Use  . Smoking status: Never Smoker  . Smokeless tobacco: Current User  Substance and Sexual Activity  . Alcohol use: No  . Drug use: No  . Sexual activity: Not on file  Lifestyle  . Physical activity:    Days per week: Not on file    Minutes per session: Not on file  . Stress: Not on file  Relationships  . Social connections:    Talks on phone: Not on file    Gets together: Not on file    Attends religious service: Not on file    Active member of club or organization: Not on file    Attends meetings of clubs or organizations: Not on file    Relationship status: Not on file  Other Topics Concern  . Not on file  Social History Narrative  . Not on file     Family History: The patient's family history includes Heart attack in his father; Hyperlipidemia in his father; Stroke in his father.  ROS:   Please see the history of present illness.    All 14 point review of systems negative except as described per history of present illness  EKGs/Labs/Other Studies Reviewed:      Recent Labs: No results found for requested labs within last 8760 hours.  Recent Lipid Panel No results found for: CHOL, TRIG, HDL, CHOLHDL, VLDL, LDLCALC, LDLDIRECT  Physical Exam:    VS:  BP 128/66 (BP Location: Right Arm, Patient Position: Sitting, Cuff Size: Normal)   Pulse (!) 51   Ht 5\' 9"  (1.753 m)   Wt 210 lb (95.3 kg)   SpO2 98%   BMI 31.01 kg/m     Wt Readings from Last 3 Encounters:  06/16/18 210 lb (95.3 kg)  11/27/17 209 lb 0.6 oz (94.8 kg)  05/22/17 208 lb 12.8 oz (94.7 kg)     GEN:  Well nourished, well developed in no acute distress HEENT: Normal NECK: No JVD; No carotid bruits LYMPHATICS: No lymphadenopathy CARDIAC: RRR, no  murmurs, no rubs, no gallops RESPIRATORY:  Clear to auscultation without rales, wheezing or rhonchi  ABDOMEN: Soft, non-tender, non-distended MUSCULOSKELETAL:  No edema; No deformity  SKIN: Warm and dry LOWER EXTREMITIES: no swelling NEUROLOGIC:  Alert and oriented x 3 PSYCHIATRIC:  Normal affect   ASSESSMENT:    1. Paroxysmal atrial fibrillation (HCC)   2. Essential hypertension   3. Type 2 diabetes mellitus without complication, without long-term current use of insulin ([place])   4. Pure hypercholesterolemia    PLAN:    In order of problems listed above:  1. Paroxysmal atrial fibrillation maintaining sinus rhythm EKG done today showed normal sinus rhythm normal PR interval incomplete left bundle branch block.  Nonspecific ST segment changes.  He is anticoagulated with Xarelto which we will continue.  He is on amiodarone 100 mg daily. 2. Essential hypertension blood pressure well controlled continue present management. 3. Type 2 diabetes doing well from that point review hopefully will get better with regular exercises with his spine to do. 4. Dyslipidemia he is fasting lipid profile was done by primary care physician apparently very good we will try to get a copy of it.   Medication Adjustments/Labs and Tests Ordered: Current medicines are reviewed at length with the patient today.  Concerns regarding medicines are outlined above.  No orders of the defined types were placed in this encounter.  Medication changes: No orders of the defined types were placed in this encounter.   Signed, Jeremiah Liter, MD, [facility] 06/16/2018 10:02 AM    [place]

## 2018-06-16 NOTE — Patient Instructions (Signed)
Medication Instructions:  Your physician recommends that you continue on your current medications as directed. Please refer to the Current Medication list given to you today.  Samples of xarelto given  Labwork: None  Testing/Procedures: None  Follow-Up: Your physician recommends that you schedule a follow-up appointment in: 5 months  Any Other Special Instructions Will Be Listed Below (If Applicable).     If you need a refill on your cardiac medications before your next appointment, please call your pharmacy.   [place], RN, BSN

## 2018-06-24 DIAGNOSIS — E1065 Type 1 diabetes mellitus with hyperglycemia: Secondary | ICD-10-CM | POA: Diagnosis not present

## 2018-08-11 ENCOUNTER — Other Ambulatory Visit: Payer: Self-pay | Admitting: Emergency Medicine

## 2018-08-11 MED ORDER — AMIODARONE HCL 200 MG PO TABS: ORAL_TABLET | ORAL | 1 refills | Status: DC

## 2018-08-11 NOTE — Telephone Encounter (Signed)
Amiodarone 200 mg tablet refilled.

## 2018-08-13 DIAGNOSIS — E1065 Type 1 diabetes mellitus with hyperglycemia: Secondary | ICD-10-CM | POA: Diagnosis not present

## 2018-08-13 DIAGNOSIS — E108 Type 1 diabetes mellitus with unspecified complications: Secondary | ICD-10-CM | POA: Diagnosis not present

## 2018-08-13 DIAGNOSIS — I10 Essential (primary) hypertension: Secondary | ICD-10-CM | POA: Diagnosis not present

## 2018-08-13 DIAGNOSIS — Z23 Encounter for immunization: Secondary | ICD-10-CM | POA: Diagnosis not present

## 2018-09-11 DIAGNOSIS — R05 Cough: Secondary | ICD-10-CM | POA: Diagnosis not present

## 2018-09-11 DIAGNOSIS — J329 Chronic sinusitis, unspecified: Secondary | ICD-10-CM | POA: Diagnosis not present

## 2018-09-24 DIAGNOSIS — E1065 Type 1 diabetes mellitus with hyperglycemia: Secondary | ICD-10-CM | POA: Diagnosis not present

## 2018-09-24 DIAGNOSIS — Z794 Long term (current) use of insulin: Secondary | ICD-10-CM | POA: Diagnosis not present

## 2018-09-25 DIAGNOSIS — E109 Type 1 diabetes mellitus without complications: Secondary | ICD-10-CM | POA: Diagnosis not present

## 2018-10-07 ENCOUNTER — Other Ambulatory Visit: Payer: Self-pay

## 2018-10-12 DIAGNOSIS — R590 Localized enlarged lymph nodes: Secondary | ICD-10-CM | POA: Diagnosis not present

## 2018-11-17 ENCOUNTER — Encounter: Payer: Self-pay | Admitting: Cardiology

## 2018-11-17 DIAGNOSIS — R3911 Hesitancy of micturition: Secondary | ICD-10-CM | POA: Diagnosis not present

## 2018-11-17 DIAGNOSIS — E1040 Type 1 diabetes mellitus with diabetic neuropathy, unspecified: Secondary | ICD-10-CM | POA: Diagnosis not present

## 2018-11-17 DIAGNOSIS — I4891 Unspecified atrial fibrillation: Secondary | ICD-10-CM | POA: Diagnosis not present

## 2018-11-17 DIAGNOSIS — I10 Essential (primary) hypertension: Secondary | ICD-10-CM | POA: Diagnosis not present

## 2018-11-17 DIAGNOSIS — Z6830 Body mass index (BMI) 30.0-30.9, adult: Secondary | ICD-10-CM | POA: Diagnosis not present

## 2018-11-17 DIAGNOSIS — E669 Obesity, unspecified: Secondary | ICD-10-CM | POA: Diagnosis not present

## 2018-11-17 DIAGNOSIS — E1065 Type 1 diabetes mellitus with hyperglycemia: Secondary | ICD-10-CM | POA: Diagnosis not present

## 2018-11-17 DIAGNOSIS — Z1389 Encounter for screening for other disorder: Secondary | ICD-10-CM | POA: Diagnosis not present

## 2018-11-17 DIAGNOSIS — E7800 Pure hypercholesterolemia, unspecified: Secondary | ICD-10-CM | POA: Diagnosis not present

## 2018-11-17 DIAGNOSIS — E039 Hypothyroidism, unspecified: Secondary | ICD-10-CM | POA: Diagnosis not present

## 2018-11-17 DIAGNOSIS — E10319 Type 1 diabetes mellitus with unspecified diabetic retinopathy without macular edema: Secondary | ICD-10-CM | POA: Diagnosis not present

## 2018-12-29 DIAGNOSIS — E1065 Type 1 diabetes mellitus with hyperglycemia: Secondary | ICD-10-CM | POA: Diagnosis not present

## 2018-12-29 DIAGNOSIS — Z794 Long term (current) use of insulin: Secondary | ICD-10-CM | POA: Diagnosis not present

## 2019-01-21 ENCOUNTER — Ambulatory Visit (INDEPENDENT_AMBULATORY_CARE_PROVIDER_SITE_OTHER): Payer: Medicare Other | Admitting: Cardiology

## 2019-01-21 ENCOUNTER — Encounter: Payer: Self-pay | Admitting: Cardiology

## 2019-01-21 VITALS — BP 120/70 | HR 55 | Ht 69.0 in | Wt 210.4 lb

## 2019-01-21 DIAGNOSIS — I10 Essential (primary) hypertension: Secondary | ICD-10-CM | POA: Diagnosis not present

## 2019-01-21 DIAGNOSIS — E7800 Pure hypercholesterolemia, unspecified: Secondary | ICD-10-CM | POA: Diagnosis not present

## 2019-01-21 DIAGNOSIS — I480 Paroxysmal atrial fibrillation: Secondary | ICD-10-CM

## 2019-01-21 DIAGNOSIS — E119 Type 2 diabetes mellitus without complications: Secondary | ICD-10-CM | POA: Diagnosis not present

## 2019-01-21 NOTE — Progress Notes (Signed)
Cardiology Office Note:    Date:  01/21/2019   ID:  Jeremiah Sheriff., DOB 03-Apr-1946, MRN [id number]  PCP:  [name], MD  Cardiologist:  [name], MD    Referring MD: [name], [name], MD   Chief Complaint  Patient presents with  . Follow-up  Doing well  History of Present Illness:    Jeremiah Costilla. is a 73 y.o. male with paroxysmal atrial fibrillation.  Denies having any episode of palpitation lately.  Overall doing well very active try to walk on the regular basis sustained injury to the left knee which slows him down somewhat but overall seems to be doing well.  Today he is son had second child and he is very excited about it.  Past Medical History:  Diagnosis Date  . Atrial fibrillation ([place])   . Diabetes mellitus without complication ([place])   . Hyperlipidemia   . Hypertension     Past Surgical History:  Procedure Laterality Date  . NO PAST SURGERIES      Current Medications: Current Meds  Medication Sig  . amiodarone (PACERONE) 200 MG tablet TAKE 1/2 TABLET BY MOUTH DAILY AT 6 AM AS DIRECTED BY DOCTOR  . amLODipine (NORVASC) 10 MG tablet Take 10 mg by mouth daily.  Marland Kitchen atorvastatin (LIPITOR) 40 MG tablet Take 40 mg by mouth daily.  . furosemide (LASIX) 20 MG tablet TAKE ONE (1) TABLET BY MOUTH EVERY DAY.  . hydrALAZINE (APRESOLINE) 50 MG tablet Take 50 mg by mouth daily.  . insulin aspart protamine - aspart (NOVOLOG 70/30 MIX) (70-30) 100 UNIT/ML FlexPen Inject into the skin daily. Pump  . levothyroxine (SYNTHROID, LEVOTHROID) 75 MCG tablet Take 1 tablet by mouth daily.  . quinapril (ACCUPRIL) 40 MG tablet Take 40 mg by mouth daily.  . rivaroxaban (XARELTO) 20 MG TABS tablet Take 1 tablet by mouth daily.     Allergies:   Patient has no known allergies.   Social History   Socioeconomic History  . Marital status: Married    Spouse name: Not on file  . Number of children: Not on file  . Years of education: Not on file  . Highest education level:  Not on file  Occupational History  . Not on file  Social Needs  . Financial resource strain: Not on file  . Food insecurity:    Worry: Not on file    Inability: Not on file  . Transportation needs:    Medical: Not on file    Non-medical: Not on file  Tobacco Use  . Smoking status: Never Smoker  . Smokeless tobacco: Current User  Substance and Sexual Activity  . Alcohol use: No  . Drug use: No  . Sexual activity: Not on file  Lifestyle  . Physical activity:    Days per week: Not on file    Minutes per session: Not on file  . Stress: Not on file  Relationships  . Social connections:    Talks on phone: Not on file    Gets together: Not on file    Attends religious service: Not on file    Active member of club or organization: Not on file    Attends meetings of clubs or organizations: Not on file    Relationship status: Not on file  Other Topics Concern  . Not on file  Social History Narrative  . Not on file     Family History: The patient's family history includes Heart attack in his father; Hyperlipidemia  in his father; Stroke in his father. ROS:   Please see the history of present illness.    All 14 point review of systems negative except as described per history of present illness  EKGs/Labs/Other Studies Reviewed:      Recent Labs: No results found for requested labs within last 8760 hours.  Recent Lipid Panel No results found for: CHOL, TRIG, HDL, CHOLHDL, VLDL, LDLCALC, LDLDIRECT  Physical Exam:    VS:  BP 120/70   Pulse (!) 55   Ht 5\' 9"  (1.753 m)   Wt 210 lb 6.4 oz (95.4 kg)   SpO2 97%   BMI 31.07 kg/m     Wt Readings from Last 3 Encounters:  01/21/19 210 lb 6.4 oz (95.4 kg)  06/16/18 210 lb (95.3 kg)  11/27/17 209 lb 0.6 oz (94.8 kg)     GEN:  Well nourished, well developed in no acute distress HEENT: Normal NECK: No JVD; No carotid bruits LYMPHATICS: No lymphadenopathy CARDIAC: RRR, no murmurs, no rubs, no gallops RESPIRATORY:  Clear to  auscultation without rales, wheezing or rhonchi  ABDOMEN: Soft, non-tender, non-distended MUSCULOSKELETAL:  No edema; No deformity  SKIN: Warm and dry LOWER EXTREMITIES: no swelling NEUROLOGIC:  Alert and oriented x 3 PSYCHIATRIC:  Normal affect   ASSESSMENT:    1. Paroxysmal atrial fibrillation (HCC)   2. Essential hypertension   3. Type 2 diabetes mellitus without complication, without long-term current use of insulin ([place])   4. Pure hypercholesterolemia    PLAN:    In order of problems listed above:  1. Paroxysmal atrial fibrillation anticoagulated which I will continue denies having any episodes.  On amiodarone will call primary care physician to see if there is any TSH or liver function test done if not we will do the test. 2. Essential hypertension blood pressure appears to be well controlled continue present management. 3. Type 2 diabetes followed by internal medicine team.  Stable. 4. Dyslipidemia will call primary care physician to get fasting lipid profile.   Medication Adjustments/Labs and Tests Ordered: Current medicines are reviewed at length with the patient today.  Concerns regarding medicines are outlined above.  No orders of the defined types were placed in this encounter.  Medication changes: No orders of the defined types were placed in this encounter.   Signed, Park Liter, MD, [facility] 01/21/2019 11:44 AM    [place]

## 2019-01-21 NOTE — Patient Instructions (Signed)
Medication Instructions:  Your physician recommends that you continue on your current medications as directed. Please refer to the Current Medication list given to you today.  If you need a refill on your cardiac medications before your next appointment, please call your pharmacy.   Lab work: None ordered If you have labs (blood work) drawn today and your tests are completely normal, you will receive your results only by: Marland Kitchen MyChart Message (if you have MyChart) OR . A paper copy in the mail If you have any lab test that is abnormal or we need to change your treatment, we will call you to review the results.  Testing/Procedures: None ordered  Follow-Up: At [facility], you and your health needs are our priority.  As part of our continuing mission to provide you with exceptional heart care, we have created designated Provider Care Teams.  These Care Teams include your primary Cardiologist (physician) and Advanced Practice Providers (APPs -  Physician Assistants and Nurse Practitioners) who all work together to provide you with the care you need, when you need it. You will need a follow up appointment in 6 months.  You may see  [name] or another member of our [employer] Provider Team in [place]: Shirlee More, MD . Jyl Heinz, MD

## 2019-03-18 DIAGNOSIS — E1165 Type 2 diabetes mellitus with hyperglycemia: Secondary | ICD-10-CM | POA: Diagnosis not present

## 2019-05-18 ENCOUNTER — Other Ambulatory Visit: Payer: Self-pay | Admitting: Podiatry

## 2019-05-18 ENCOUNTER — Other Ambulatory Visit: Payer: Self-pay

## 2019-05-18 ENCOUNTER — Encounter: Payer: Self-pay | Admitting: Podiatry

## 2019-05-18 ENCOUNTER — Ambulatory Visit (INDEPENDENT_AMBULATORY_CARE_PROVIDER_SITE_OTHER): Payer: Medicare Other

## 2019-05-18 ENCOUNTER — Ambulatory Visit (INDEPENDENT_AMBULATORY_CARE_PROVIDER_SITE_OTHER): Payer: Medicare Other | Admitting: Podiatry

## 2019-05-18 VITALS — Temp 98.3°F | Resp 16

## 2019-05-18 DIAGNOSIS — M7732 Calcaneal spur, left foot: Secondary | ICD-10-CM

## 2019-05-18 DIAGNOSIS — M79672 Pain in left foot: Secondary | ICD-10-CM

## 2019-05-18 DIAGNOSIS — M722 Plantar fascial fibromatosis: Secondary | ICD-10-CM | POA: Diagnosis not present

## 2019-05-18 DIAGNOSIS — M216X2 Other acquired deformities of left foot: Secondary | ICD-10-CM | POA: Diagnosis not present

## 2019-05-18 DIAGNOSIS — I872 Venous insufficiency (chronic) (peripheral): Secondary | ICD-10-CM

## 2019-05-18 NOTE — Progress Notes (Signed)
   Subjective:    Patient ID: Jeremiah Beasley., male    DOB: February 15, 1946, 73 y.o.   MRN: [id number]  HPI    Review of Systems  Musculoskeletal: Positive for arthralgias and myalgias.  All other systems reviewed and are negative.      Objective:   Physical Exam        Assessment & Plan:

## 2019-05-18 NOTE — Progress Notes (Signed)
Subjective:  Patient ID: Jeremiah Beasley., male    DOB: 1946-05-03,  MRN: [id number]  Chief Complaint  Patient presents with  . Foot Pain    Lt bottom heel painx  5 mo; 8/10 shapr constant pain - no injury Tx: Gabapentin (Rx by PCP for foot pain and tinlign) -w/ toes tingling    73 y.o. male presents with the above complaint. Hx as above.   Review of Systems: Negative except as noted in the HPI. Denies N/V/F/Ch.  Past Medical History:  Diagnosis Date  . Atrial fibrillation ([place])   . Diabetes mellitus without complication ([place])   . Hyperlipidemia   . Hypertension     Current Outpatient Medications:  .  amiodarone (PACERONE) 200 MG tablet, TAKE 1/2 TABLET BY MOUTH DAILY AT 6 AM AS DIRECTED BY DOCTOR, Disp: 90 tablet, Rfl: 1 .  amLODipine (NORVASC) 10 MG tablet, Take 10 mg by mouth daily., Disp: , Rfl:  .  atorvastatin (LIPITOR) 40 MG tablet, Take 40 mg by mouth daily., Disp: , Rfl:  .  furosemide (LASIX) 20 MG tablet, TAKE ONE (1) TABLET BY MOUTH EVERY DAY., Disp: 90 tablet, Rfl: 1 .  gabapentin (NEURONTIN) 300 MG capsule, , Disp: , Rfl:  .  hydrALAZINE (APRESOLINE) 50 MG tablet, Take 50 mg by mouth daily., Disp: , Rfl:  .  insulin aspart protamine - aspart (NOVOLOG 70/30 MIX) (70-30) 100 UNIT/ML FlexPen, Inject into the skin daily. Pump, Disp: , Rfl:  .  levothyroxine (SYNTHROID, LEVOTHROID) 75 MCG tablet, Take 1 tablet by mouth daily., Disp: , Rfl:  .  NOVOLOG 100 UNIT/ML injection, TO BE USED IN INSULIN PUMP AS DIRECTED DX  E10.40, Disp: , Rfl:  .  quinapril (ACCUPRIL) 40 MG tablet, Take 40 mg by mouth daily., Disp: , Rfl:  .  rivaroxaban (XARELTO) 20 MG TABS tablet, Take 1 tablet by mouth daily., Disp: , Rfl:  .  simvastatin (ZOCOR) 80 MG tablet, , Disp: , Rfl:   Social History   Tobacco Use  Smoking Status Never Smoker  Smokeless Tobacco Current User    No Known Allergies Objective:   Vitals:   05/18/19 1324  Resp: 16  Temp: 98.3 F (36.8 C)   There is no  height or weight on file to calculate BMI. Constitutional Well developed. Well nourished.  Vascular Dorsalis pedis pulses palpable bilaterally. Posterior tibial pulses palpable bilaterally. Capillary refill normal to all digits.  No cyanosis or clubbing noted. Pedal hair growth normal. Pitting edema BLE with thin shiny skin bilat.  Neurologic Normal speech. Oriented to person, place, and time. Epicritic sensation to light touch grossly present bilaterally.  Dermatologic Nails well groomed and normal in appearance. Small cut left anterior shin with serous drainage. No skin lesions.  Orthopedic: Normal joint ROM without pain or crepitus bilaterally. No visible deformities. Tender to palpation at the calcaneal tuber left. No pain with calcaneal squeeze left. Ankle ROM diminished range of motion left. Silfverskiold Test: positive left.   Radiographs: Taken and reviewed. No acute fractures or dislocations. No evidence of stress fracture.  Plantar heel spur present. Posterior heel spur present.   Assessment:   1. Plantar fasciitis   2. Acquired equinus deformity of left foot   3. Heel spur, left   4. Pain of left heel   5. Venous (peripheral) insufficiency    Plan:  Patient was evaluated and treated and all questions answered.  Plantar Fasciitis, left - XR reviewed as above.  - Educated on icing and  stretching. Instructions given.  - Injection delivered to the plantar fascia as below. - DME: Night splint dispensed. - Pharmacologic management: Meloxicam. Educated on risks/benefits and proper taking of medication.  Procedure: Injection Tendon/Ligament Location: Left plantar fascia at the glabrous junction; medial approach. Skin Prep: alcohol Injectate: 1 cc 0.5% marcaine plain, 1 cc celestone Disposition: Patient tolerated procedure well. Injection site dressed with a band-aid.  Venous insufficiency -Discussed importance of compression socks. -Small open wound dressed with  abxointment and band-aid. Discussed importance of prevention of these wounds progressing to chronic VLUs.   Return in about 3 weeks (around 06/08/2019) for Plantar fasciitis, Left.

## 2019-05-18 NOTE — Patient Instructions (Signed)

## 2019-05-26 DIAGNOSIS — E1040 Type 1 diabetes mellitus with diabetic neuropathy, unspecified: Secondary | ICD-10-CM | POA: Diagnosis not present

## 2019-05-26 DIAGNOSIS — E1065 Type 1 diabetes mellitus with hyperglycemia: Secondary | ICD-10-CM | POA: Diagnosis not present

## 2019-06-08 ENCOUNTER — Ambulatory Visit: Payer: Medicare Other | Admitting: Podiatry

## 2019-06-15 ENCOUNTER — Ambulatory Visit (INDEPENDENT_AMBULATORY_CARE_PROVIDER_SITE_OTHER): Payer: Medicare Other | Admitting: Podiatry

## 2019-06-15 ENCOUNTER — Other Ambulatory Visit: Payer: Self-pay

## 2019-06-15 DIAGNOSIS — M216X2 Other acquired deformities of left foot: Secondary | ICD-10-CM

## 2019-06-15 DIAGNOSIS — M722 Plantar fascial fibromatosis: Secondary | ICD-10-CM | POA: Diagnosis not present

## 2019-06-15 MED ORDER — DICLOFENAC SODIUM 1 % TD GEL: 2.0000 g | Freq: Four times a day (QID) | TRANSDERMAL | 1 refills | Status: DC

## 2019-06-15 NOTE — Progress Notes (Signed)
Subjective:  Patient ID: Jeremiah Beasley., male    DOB: 08-29-1946,  MRN: [id number]  Chief Complaint  Patient presents with  . Plantar Fasciitis    pt is here for a f/u on plantar fasciitis, pt states that he is still in pain, pt also states that the injection he recieved last time did not help, pt describes pain as throbbing.    73 y.o. male presents with the above complaint. Hx as above.  Review of Systems: Negative except as noted in the HPI. Denies N/V/F/Ch.  Past Medical History:  Diagnosis Date  . Atrial fibrillation ([place])   . Diabetes mellitus without complication ([place])   . Hyperlipidemia   . Hypertension     Current Outpatient Medications:  .  amiodarone (PACERONE) 200 MG tablet, TAKE 1/2 TABLET BY MOUTH DAILY AT 6 AM AS DIRECTED BY DOCTOR, Disp: 90 tablet, Rfl: 1 .  amLODipine (NORVASC) 10 MG tablet, Take 10 mg by mouth daily., Disp: , Rfl:  .  atorvastatin (LIPITOR) 40 MG tablet, Take 40 mg by mouth daily., Disp: , Rfl:  .  furosemide (LASIX) 20 MG tablet, TAKE ONE (1) TABLET BY MOUTH EVERY DAY., Disp: 90 tablet, Rfl: 1 .  gabapentin (NEURONTIN) 300 MG capsule, , Disp: , Rfl:  .  hydrALAZINE (APRESOLINE) 50 MG tablet, Take 50 mg by mouth daily., Disp: , Rfl:  .  insulin aspart protamine - aspart (NOVOLOG 70/30 MIX) (70-30) 100 UNIT/ML FlexPen, Inject into the skin daily. Pump, Disp: , Rfl:  .  levothyroxine (SYNTHROID, LEVOTHROID) 75 MCG tablet, Take 1 tablet by mouth daily., Disp: , Rfl:  .  NOVOLOG 100 UNIT/ML injection, TO BE USED IN INSULIN PUMP AS DIRECTED DX  E10.40, Disp: , Rfl:  .  quinapril (ACCUPRIL) 40 MG tablet, Take 40 mg by mouth daily., Disp: , Rfl:  .  rivaroxaban (XARELTO) 20 MG TABS tablet, Take 1 tablet by mouth daily., Disp: , Rfl:  .  simvastatin (ZOCOR) 80 MG tablet, , Disp: , Rfl:  .  diclofenac sodium (VOLTAREN) 1 % GEL, Apply 2 g topically 4 (four) times daily., Disp: 100 g, Rfl: 1  Social History   Tobacco Use  Smoking Status Never Smoker   Smokeless Tobacco Current User    No Known Allergies Objective:   There were no vitals filed for this visit. There is no height or weight on file to calculate BMI. Constitutional Well developed. Well nourished.  Vascular Dorsalis pedis pulses palpable bilaterally. Posterior tibial pulses palpable bilaterally. Capillary refill normal to all digits.  No cyanosis or clubbing noted. Pedal hair growth normal. Pitting edema BLE with thin shiny skin bilat.  Neurologic Normal speech. Oriented to person, place, and time. Epicritic sensation to light touch grossly present bilaterally.  Dermatologic Nails well groomed and normal in appearance. Small cut left anterior shin with serous drainage. No skin lesions.  Orthopedic: Normal joint ROM without pain or crepitus bilaterally. No visible deformities. Tender to palpation at the calcaneal tuber left. No pain with calcaneal squeeze left. Ankle ROM diminished range of motion left. Silfverskiold Test: positive left.   Radiographs: None  Assessment:   1. Plantar fasciitis   2. Acquired equinus deformity of left foot    Plan:  Patient was evaluated and treated and all questions answered.  Plantar Fasciitis, left - PF starapping applied. - No injection today - Refer to PT - Rx Voltaren Gel  Procedure: Plantar fascial taping. Location: Left plantar fascia Skin Prep: spray adhesive. Technique: Following spray  adhesive, a broad piece of moleskine was applied to the plantar foot, followed by interlocking pieces of athletic tape in order to lift and rest the plantar fascia. Disposition: Educated on post-taping care.   Return in about 6 weeks (around 07/27/2019) for Plantar fasciitis.

## 2019-06-17 DIAGNOSIS — E119 Type 2 diabetes mellitus without complications: Secondary | ICD-10-CM | POA: Diagnosis not present

## 2019-06-17 DIAGNOSIS — I10 Essential (primary) hypertension: Secondary | ICD-10-CM | POA: Diagnosis not present

## 2019-06-25 DIAGNOSIS — M79672 Pain in left foot: Secondary | ICD-10-CM | POA: Diagnosis not present

## 2019-06-29 DIAGNOSIS — M79672 Pain in left foot: Secondary | ICD-10-CM | POA: Diagnosis not present

## 2019-07-02 DIAGNOSIS — M79672 Pain in left foot: Secondary | ICD-10-CM | POA: Diagnosis not present

## 2019-07-05 DIAGNOSIS — M79672 Pain in left foot: Secondary | ICD-10-CM | POA: Diagnosis not present

## 2019-07-13 DIAGNOSIS — M79672 Pain in left foot: Secondary | ICD-10-CM | POA: Diagnosis not present

## 2019-07-16 DIAGNOSIS — M79672 Pain in left foot: Secondary | ICD-10-CM | POA: Diagnosis not present

## 2019-07-20 ENCOUNTER — Other Ambulatory Visit: Payer: Self-pay

## 2019-07-20 ENCOUNTER — Ambulatory Visit (INDEPENDENT_AMBULATORY_CARE_PROVIDER_SITE_OTHER): Payer: Medicare Other | Admitting: Podiatry

## 2019-07-20 ENCOUNTER — Encounter: Payer: Self-pay | Admitting: Podiatry

## 2019-07-20 VITALS — Temp 98.5°F | Resp 16

## 2019-07-20 DIAGNOSIS — M722 Plantar fascial fibromatosis: Secondary | ICD-10-CM

## 2019-07-20 DIAGNOSIS — M79672 Pain in left foot: Secondary | ICD-10-CM | POA: Diagnosis not present

## 2019-07-22 DIAGNOSIS — M79672 Pain in left foot: Secondary | ICD-10-CM | POA: Diagnosis not present

## 2019-07-27 ENCOUNTER — Encounter: Payer: Self-pay | Admitting: Cardiology

## 2019-07-27 ENCOUNTER — Other Ambulatory Visit: Payer: Self-pay

## 2019-07-27 ENCOUNTER — Ambulatory Visit (INDEPENDENT_AMBULATORY_CARE_PROVIDER_SITE_OTHER): Payer: Medicare Other | Admitting: Cardiology

## 2019-07-27 VITALS — BP 138/64 | HR 51 | Ht 69.0 in | Wt 206.0 lb

## 2019-07-27 DIAGNOSIS — I10 Essential (primary) hypertension: Secondary | ICD-10-CM | POA: Diagnosis not present

## 2019-07-27 DIAGNOSIS — I480 Paroxysmal atrial fibrillation: Secondary | ICD-10-CM | POA: Diagnosis not present

## 2019-07-27 DIAGNOSIS — E7800 Pure hypercholesterolemia, unspecified: Secondary | ICD-10-CM

## 2019-07-27 DIAGNOSIS — E119 Type 2 diabetes mellitus without complications: Secondary | ICD-10-CM

## 2019-07-27 NOTE — Progress Notes (Signed)
Cardiology Office Note:    Date:  07/27/2019   ID:  Jeremiah Beasley., DOB [birth year], MRN [id number]  PCP:  [name], MD  Cardiologist:  [name], MD    Referring MD: [name], [name], MD   Chief Complaint  Patient presents with  . Follow-up  Doing very well  History of Present Illness:    Jeremiah Beasley. is a 73 y.o. male with paroxysmal atrial fibrillation, chads 2 Vascor equals 3, anticoagulated, overall doing very well denies have any chest pain, tightness, pressure, burning in the chest.  He still very active he take care of his 4 grandchildren he is enjoying this tremendously.  Past Medical History:  Diagnosis Date  . Atrial fibrillation ([place])   . Diabetes mellitus without complication ([place])   . Hyperlipidemia   . Hypertension     Past Surgical History:  Procedure Laterality Date  . NO PAST SURGERIES      Current Medications: Current Meds  Medication Sig  . amiodarone (PACERONE) 200 MG tablet TAKE 1/2 TABLET BY MOUTH DAILY AT 6 AM AS DIRECTED BY DOCTOR  . amLODipine (NORVASC) 10 MG tablet Take 10 mg by mouth daily.  Marland Kitchen atorvastatin (LIPITOR) 40 MG tablet Take 40 mg by mouth daily.  . furosemide (LASIX) 20 MG tablet TAKE ONE (1) TABLET BY MOUTH EVERY DAY.  Marland Kitchen gabapentin (NEURONTIN) 300 MG capsule Take 300 mg by mouth as needed.   . hydrALAZINE (APRESOLINE) 50 MG tablet Take 50 mg by mouth daily.  Marland Kitchen levothyroxine (SYNTHROID, LEVOTHROID) 75 MCG tablet Take 1 tablet by mouth daily.  Marland Kitchen NOVOLOG 100 UNIT/ML injection TO BE USED IN INSULIN PUMP AS DIRECTED DX  E10.40  . quinapril (ACCUPRIL) 40 MG tablet Take 40 mg by mouth daily.  . rivaroxaban (XARELTO) 20 MG TABS tablet Take 1 tablet by mouth daily.  . simvastatin (ZOCOR) 80 MG tablet Take 80 mg by mouth daily at 6 PM.      Allergies:   Patient has no known allergies.   Social History   Socioeconomic History  . Marital status: Married    Spouse name: Not on file  . Number of children: Not on file  .  Years of education: Not on file  . Highest education level: Not on file  Occupational History  . Not on file  Social Needs  . Financial resource strain: Not on file  . Food insecurity    Worry: Not on file    Inability: Not on file  . Transportation needs    Medical: Not on file    Non-medical: Not on file  Tobacco Use  . Smoking status: Never Smoker  . Smokeless tobacco: Current User  Substance and Sexual Activity  . Alcohol use: No  . Drug use: No  . Sexual activity: Not on file  Lifestyle  . Physical activity    Days per week: Not on file    Minutes per session: Not on file  . Stress: Not on file  Relationships  . Social Herbalist on phone: Not on file    Gets together: Not on file    Attends religious service: Not on file    Active member of club or organization: Not on file    Attends meetings of clubs or organizations: Not on file    Relationship status: Not on file  Other Topics Concern  . Not on file  Social History Narrative  . Not on file  Family History: The patient's family history includes Heart attack in his father; Hyperlipidemia in his father; Stroke in his father. ROS:   Please see the history of present illness.    All 14 point review of systems negative except as described per history of present illness  EKGs/Labs/Other Studies Reviewed:      Recent Labs: No results found for requested labs within last 8760 hours.  Recent Lipid Panel No results found for: CHOL, TRIG, HDL, CHOLHDL, VLDL, LDLCALC, LDLDIRECT  Physical Exam:    VS:  BP 138/64   Pulse (!) 51   Ht 5\' 9"  (1.753 m)   Wt 206 lb (93.4 kg)   SpO2 98%   BMI 30.42 kg/m     Wt Readings from Last 3 Encounters:  07/27/19 206 lb (93.4 kg)  01/21/19 210 lb 6.4 oz (95.4 kg)  06/16/18 210 lb (95.3 kg)     GEN:  Well nourished, well developed in no acute distress HEENT: Normal NECK: No JVD; No carotid bruits LYMPHATICS: No lymphadenopathy CARDIAC: RRR, no murmurs,  no rubs, no gallops RESPIRATORY:  Clear to auscultation without rales, wheezing or rhonchi  ABDOMEN: Soft, non-tender, non-distended MUSCULOSKELETAL:  No edema; No deformity  SKIN: Warm and dry LOWER EXTREMITIES: no swelling NEUROLOGIC:  Alert and oriented x 3 PSYCHIATRIC:  Normal affect   ASSESSMENT:    1. Paroxysmal atrial fibrillation (HCC)   2. Essential hypertension   3. Type 2 diabetes mellitus without complication, without long-term current use of insulin ([place])   4. Pure hypercholesterolemia    PLAN:    In order of problems listed above:  1. Paroxysmal atrial fibrillation doing well denies having any palpitations we will continue present management including anticoagulation. 2. Essential hypertension blood pressure well controlled continue present management. 3. Type 2 diabetes stable followed by internal medicine team 4. Dyslipidemia continue with statin.  Overall he is doing very well he did have some plantar fasciitis left foot which slowed him down in terms of walking but he watch his weight very carefully and that is stable.   Medication Adjustments/Labs and Tests Ordered: Current medicines are reviewed at length with the patient today.  Concerns regarding medicines are outlined above.  No orders of the defined types were placed in this encounter.  Medication changes: No orders of the defined types were placed in this encounter.   Signed, Park Liter, MD, [facility] 07/27/2019 11:50 AM    [place]

## 2019-07-27 NOTE — Patient Instructions (Signed)
Medication Instructions:  Your physician recommends that you continue on your current medications as directed. Please refer to the Current Medication list given to you today.  If you need a refill on your cardiac medications before your next appointment, please call your pharmacy.   Lab work: None.   If you have labs (blood work) drawn today and your tests are completely normal, you will receive your results only by: . MyChart Message (if you have MyChart) OR . A paper copy in the mail If you have any lab test that is abnormal or we need to change your treatment, we will call you to review the results.  Testing/Procedures: None.   Follow-Up: At [facility], you and your health needs are our priority.  As part of our continuing mission to provide you with exceptional heart care, we have created designated Provider Care Teams.  These Care Teams include your primary Cardiologist (physician) and Advanced Practice Providers (APPs -  Physician Assistants and Nurse Practitioners) who all work together to provide you with the care you need, when you need it. You will need a follow up appointment in 6 months.  Please call our office 2 months in advance to schedule this appointment.  You may see No primary care provider on file. or another member of our [facility] Provider Team in [place]: Brian Munley, MD . Rajan Revankar, MD  Any Other Special Instructions Will Be Listed Below (If Applicable).    

## 2019-08-01 NOTE — Progress Notes (Signed)
Subjective:  Patient ID: Jeremiah Sheriff., male    DOB: 23-Oct-1946,  MRN: [id number]  Chief Complaint  Patient presents with  . Plantar Fasciitis    F?U Lt PF Pt. states," a little better, hurts more at night when I get off it; 5/10 ahcy pain." tx: PT, and icing -pt denies swellign -pt denies N/V/F??Ch     73 y.o. male presents with the above complaint. Hx as above.  Review of Systems: Negative except as noted in the HPI. Denies N/V/F/Ch.  Past Medical History:  Diagnosis Date  . Atrial fibrillation ([place])   . Diabetes mellitus without complication ([place])   . Hyperlipidemia   . Hypertension     Current Outpatient Medications:  .  amiodarone (PACERONE) 200 MG tablet, TAKE 1/2 TABLET BY MOUTH DAILY AT 6 AM AS DIRECTED BY DOCTOR, Disp: 90 tablet, Rfl: 1 .  amLODipine (NORVASC) 10 MG tablet, Take 10 mg by mouth daily., Disp: , Rfl:  .  atorvastatin (LIPITOR) 40 MG tablet, Take 40 mg by mouth daily., Disp: , Rfl:  .  furosemide (LASIX) 20 MG tablet, TAKE ONE (1) TABLET BY MOUTH EVERY DAY., Disp: 90 tablet, Rfl: 1 .  gabapentin (NEURONTIN) 300 MG capsule, Take 300 mg by mouth as needed. , Disp: , Rfl:  .  hydrALAZINE (APRESOLINE) 50 MG tablet, Take 50 mg by mouth daily., Disp: , Rfl:  .  levothyroxine (SYNTHROID, LEVOTHROID) 75 MCG tablet, Take 1 tablet by mouth daily., Disp: , Rfl:  .  NOVOLOG 100 UNIT/ML injection, TO BE USED IN INSULIN PUMP AS DIRECTED DX  E10.40, Disp: , Rfl:  .  quinapril (ACCUPRIL) 40 MG tablet, Take 40 mg by mouth daily., Disp: , Rfl:  .  rivaroxaban (XARELTO) 20 MG TABS tablet, Take 1 tablet by mouth daily., Disp: , Rfl:  .  simvastatin (ZOCOR) 80 MG tablet, Take 80 mg by mouth daily at 6 PM. , Disp: , Rfl:   Social History   Tobacco Use  Smoking Status Never Smoker  Smokeless Tobacco Current User    No Known Allergies Objective:   Vitals:   07/20/19 1028  Resp: 16  Temp: 98.5 F (36.9 C)   There is no height or weight on file to calculate BMI.  Constitutional Well developed. Well nourished.  Vascular Dorsalis pedis pulses palpable bilaterally. Posterior tibial pulses palpable bilaterally. Capillary refill normal to all digits.  No cyanosis or clubbing noted. Pedal hair growth normal. Pitting edema BLE with thin shiny skin bilat.  Neurologic Normal speech. Oriented to person, place, and time. Epicritic sensation to light touch grossly present bilaterally.  Dermatologic Nails well groomed and normal in appearance. Small cut left anterior shin with serous drainage. No skin lesions.  Orthopedic: Normal joint ROM without pain or crepitus bilaterally. No visible deformities. Tender to palpation at the calcaneal tuber left. No pain with calcaneal squeeze left. Ankle ROM diminished range of motion left. Silfverskiold Test: positive left.   Radiographs: None  Assessment:   1. Plantar fasciitis    Plan:  Patient was evaluated and treated and all questions answered.  Plantar Fasciitis, left - injection to the left plantar fascia as below -Continue stretching and icing  Procedure: Injection Tendon/Ligament Consent: Verbal consent obtained. Location: Left plantar fascia at the glabrous junction; medial approach. Skin Prep: Alcohol. Injectate: 1 cc 0.5% marcaine plain, 1 cc dexamethasone phosphate, 0.5 cc kenalog 10. Disposition: Patient tolerated procedure well. Injection site dressed with a band-aid.     No follow-ups on  file.

## 2019-08-09 DIAGNOSIS — Z23 Encounter for immunization: Secondary | ICD-10-CM | POA: Diagnosis not present

## 2019-08-10 ENCOUNTER — Telehealth: Payer: Self-pay | Admitting: Cardiology

## 2019-08-10 ENCOUNTER — Ambulatory Visit: Payer: Medicare Other | Admitting: Podiatry

## 2019-08-10 NOTE — Telephone Encounter (Signed)
Telephone call to patient. Explained we did not have samples of Xarelto 20 mg at this time. Encouraged to reach out to his PCP for possible samples. Pt agreeable and thankful for trying.

## 2019-08-10 NOTE — Telephone Encounter (Signed)
Wants samples of xarelto

## 2019-08-31 DIAGNOSIS — E1040 Type 1 diabetes mellitus with diabetic neuropathy, unspecified: Secondary | ICD-10-CM | POA: Diagnosis not present

## 2019-08-31 DIAGNOSIS — Z794 Long term (current) use of insulin: Secondary | ICD-10-CM | POA: Diagnosis not present

## 2019-08-31 DIAGNOSIS — E1065 Type 1 diabetes mellitus with hyperglycemia: Secondary | ICD-10-CM | POA: Diagnosis not present

## 2019-10-05 DIAGNOSIS — E1040 Type 1 diabetes mellitus with diabetic neuropathy, unspecified: Secondary | ICD-10-CM | POA: Diagnosis not present

## 2019-10-12 DIAGNOSIS — E109 Type 1 diabetes mellitus without complications: Secondary | ICD-10-CM | POA: Diagnosis not present

## 2019-10-25 ENCOUNTER — Other Ambulatory Visit: Payer: Self-pay | Admitting: Cardiology

## 2019-11-30 DIAGNOSIS — E1040 Type 1 diabetes mellitus with diabetic neuropathy, unspecified: Secondary | ICD-10-CM | POA: Diagnosis not present

## 2019-11-30 DIAGNOSIS — E1065 Type 1 diabetes mellitus with hyperglycemia: Secondary | ICD-10-CM | POA: Diagnosis not present

## 2019-11-30 DIAGNOSIS — Z794 Long term (current) use of insulin: Secondary | ICD-10-CM | POA: Diagnosis not present

## 2019-12-16 DIAGNOSIS — Z125 Encounter for screening for malignant neoplasm of prostate: Secondary | ICD-10-CM | POA: Diagnosis not present

## 2019-12-16 DIAGNOSIS — E11319 Type 2 diabetes mellitus with unspecified diabetic retinopathy without macular edema: Secondary | ICD-10-CM | POA: Diagnosis not present

## 2019-12-16 DIAGNOSIS — E1065 Type 1 diabetes mellitus with hyperglycemia: Secondary | ICD-10-CM | POA: Diagnosis not present

## 2019-12-16 DIAGNOSIS — I4891 Unspecified atrial fibrillation: Secondary | ICD-10-CM | POA: Diagnosis not present

## 2019-12-16 DIAGNOSIS — E7800 Pure hypercholesterolemia, unspecified: Secondary | ICD-10-CM | POA: Diagnosis not present

## 2019-12-16 DIAGNOSIS — I10 Essential (primary) hypertension: Secondary | ICD-10-CM | POA: Diagnosis not present

## 2019-12-16 DIAGNOSIS — E039 Hypothyroidism, unspecified: Secondary | ICD-10-CM | POA: Diagnosis not present

## 2019-12-16 DIAGNOSIS — Z1389 Encounter for screening for other disorder: Secondary | ICD-10-CM | POA: Diagnosis not present

## 2019-12-16 DIAGNOSIS — E1165 Type 2 diabetes mellitus with hyperglycemia: Secondary | ICD-10-CM | POA: Diagnosis not present

## 2019-12-16 DIAGNOSIS — L600 Ingrowing nail: Secondary | ICD-10-CM | POA: Diagnosis not present

## 2019-12-16 DIAGNOSIS — E1040 Type 1 diabetes mellitus with diabetic neuropathy, unspecified: Secondary | ICD-10-CM | POA: Diagnosis not present

## 2019-12-16 DIAGNOSIS — Z6830 Body mass index (BMI) 30.0-30.9, adult: Secondary | ICD-10-CM | POA: Diagnosis not present

## 2020-01-20 DIAGNOSIS — E113293 Type 2 diabetes mellitus with mild nonproliferative diabetic retinopathy without macular edema, bilateral: Secondary | ICD-10-CM | POA: Diagnosis not present

## 2020-01-20 DIAGNOSIS — Z794 Long term (current) use of insulin: Secondary | ICD-10-CM | POA: Diagnosis not present

## 2020-01-20 DIAGNOSIS — H2513 Age-related nuclear cataract, bilateral: Secondary | ICD-10-CM | POA: Diagnosis not present

## 2020-02-25 DIAGNOSIS — R600 Localized edema: Secondary | ICD-10-CM | POA: Diagnosis not present

## 2020-02-25 DIAGNOSIS — M549 Dorsalgia, unspecified: Secondary | ICD-10-CM | POA: Diagnosis not present

## 2020-02-25 DIAGNOSIS — J90 Pleural effusion, not elsewhere classified: Secondary | ICD-10-CM | POA: Diagnosis not present

## 2020-02-25 DIAGNOSIS — Z8679 Personal history of other diseases of the circulatory system: Secondary | ICD-10-CM | POA: Diagnosis not present

## 2020-02-25 DIAGNOSIS — M546 Pain in thoracic spine: Secondary | ICD-10-CM | POA: Diagnosis not present

## 2020-02-27 DIAGNOSIS — R0902 Hypoxemia: Secondary | ICD-10-CM | POA: Diagnosis not present

## 2020-02-27 DIAGNOSIS — M546 Pain in thoracic spine: Secondary | ICD-10-CM | POA: Diagnosis not present

## 2020-02-27 DIAGNOSIS — R220 Localized swelling, mass and lump, head: Secondary | ICD-10-CM | POA: Diagnosis not present

## 2020-02-27 DIAGNOSIS — R0602 Shortness of breath: Secondary | ICD-10-CM | POA: Diagnosis not present

## 2020-02-27 DIAGNOSIS — R0781 Pleurodynia: Secondary | ICD-10-CM | POA: Diagnosis not present

## 2020-02-27 DIAGNOSIS — R2243 Localized swelling, mass and lump, lower limb, bilateral: Secondary | ICD-10-CM | POA: Diagnosis not present

## 2020-02-27 DIAGNOSIS — K8020 Calculus of gallbladder without cholecystitis without obstruction: Secondary | ICD-10-CM | POA: Diagnosis not present

## 2020-02-27 DIAGNOSIS — K8050 Calculus of bile duct without cholangitis or cholecystitis without obstruction: Secondary | ICD-10-CM | POA: Diagnosis not present

## 2020-02-27 DIAGNOSIS — R079 Chest pain, unspecified: Secondary | ICD-10-CM | POA: Diagnosis not present

## 2020-02-28 ENCOUNTER — Telehealth: Payer: Self-pay | Admitting: Cardiology

## 2020-02-28 ENCOUNTER — Other Ambulatory Visit: Payer: Self-pay

## 2020-02-28 DIAGNOSIS — E039 Hypothyroidism, unspecified: Secondary | ICD-10-CM | POA: Diagnosis not present

## 2020-02-28 DIAGNOSIS — T783XXA Angioneurotic edema, initial encounter: Secondary | ICD-10-CM | POA: Diagnosis not present

## 2020-02-28 DIAGNOSIS — I10 Essential (primary) hypertension: Secondary | ICD-10-CM | POA: Diagnosis not present

## 2020-02-28 DIAGNOSIS — R0902 Hypoxemia: Secondary | ICD-10-CM | POA: Diagnosis not present

## 2020-02-28 DIAGNOSIS — T464X5A Adverse effect of angiotensin-converting-enzyme inhibitors, initial encounter: Secondary | ICD-10-CM | POA: Diagnosis not present

## 2020-02-28 DIAGNOSIS — E119 Type 2 diabetes mellitus without complications: Secondary | ICD-10-CM | POA: Diagnosis not present

## 2020-02-28 DIAGNOSIS — R220 Localized swelling, mass and lump, head: Secondary | ICD-10-CM | POA: Diagnosis not present

## 2020-02-28 DIAGNOSIS — R0602 Shortness of breath: Secondary | ICD-10-CM | POA: Diagnosis not present

## 2020-02-28 DIAGNOSIS — R109 Unspecified abdominal pain: Secondary | ICD-10-CM | POA: Diagnosis not present

## 2020-02-28 NOTE — Telephone Encounter (Signed)
I need to see him before surgery, please schedule appointement

## 2020-02-28 NOTE — Telephone Encounter (Signed)
Pt c/o swelling: STAT is pt has developed SOB within 24 hours  1) How much weight have you gained and in what time span? unsure  2) If swelling, where is the swelling located? ankles  3) Are you currently taking a fluid pill? yes  4) Are you currently SOB? Yes - about a week or so  5) Do you have a log of your daily weights (if so, list)? No  6) Have you gained 3 pounds in a day or 5 pounds in a week? unsure       7) Have you traveled recently? No  Patient was at the ED over the weekend. States that he has gallstones and will have to have those removed. She is not sure if the swelling and SOB is a reaction from the gallstones. She states that everything came back normal on his heart and he had no fluid in his lungs. He is having pain in his left side which is the opposite side of his gallstones. They meet with his surgeon tomorrow at 9:15am - if surgery is needed can he stop Xalreto and aspirin and if so for how long?

## 2020-02-28 NOTE — Telephone Encounter (Signed)
Called patient and spoke with his wife per dpr. He was scheduled for a appointment tomorrow in [place] office. No further questions.

## 2020-02-28 NOTE — Telephone Encounter (Signed)
Spoke with the patients wife. Patient was at the ED over the weekend. States that he has gallstones and will have to have those removed. She is not sure if the swelling and SOB is a reaction from the gallstones. She states that everything came back normal on his heart and he had no fluid in his lungs. She is concerned that his legs have been swelling. She does not think that it has gotten any better or worse over the past week. Pt denies abdominal swelling or CP. Pt has been SOB with exertion for about two weeks.  They are concerned about is the pain and swelling is from the gallstones or something cardiac related.    He is having pain in his left side which is the opposite side of his gallstones. They meet with his surgeon tomorrow at 9:15am - if surgery is needed can he stop Xalreto and aspirin and if so for how long?

## 2020-02-29 ENCOUNTER — Ambulatory Visit: Payer: Medicare Other | Admitting: Cardiology

## 2020-02-29 DIAGNOSIS — R0902 Hypoxemia: Secondary | ICD-10-CM | POA: Diagnosis not present

## 2020-02-29 DIAGNOSIS — E1165 Type 2 diabetes mellitus with hyperglycemia: Secondary | ICD-10-CM | POA: Diagnosis not present

## 2020-02-29 DIAGNOSIS — Z2821 Immunization not carried out because of patient refusal: Secondary | ICD-10-CM | POA: Diagnosis not present

## 2020-02-29 DIAGNOSIS — Z9641 Presence of insulin pump (external) (internal): Secondary | ICD-10-CM | POA: Diagnosis present

## 2020-02-29 DIAGNOSIS — R220 Localized swelling, mass and lump, head: Secondary | ICD-10-CM | POA: Diagnosis not present

## 2020-02-29 DIAGNOSIS — T391X5A Adverse effect of 4-Aminophenol derivatives, initial encounter: Secondary | ICD-10-CM | POA: Diagnosis present

## 2020-02-29 DIAGNOSIS — K8020 Calculus of gallbladder without cholecystitis without obstruction: Secondary | ICD-10-CM | POA: Diagnosis present

## 2020-02-29 DIAGNOSIS — I5032 Chronic diastolic (congestive) heart failure: Secondary | ICD-10-CM | POA: Diagnosis not present

## 2020-02-29 DIAGNOSIS — M940 Chondrocostal junction syndrome [Tietze]: Secondary | ICD-10-CM | POA: Diagnosis present

## 2020-02-29 DIAGNOSIS — T464X5A Adverse effect of angiotensin-converting-enzyme inhibitors, initial encounter: Secondary | ICD-10-CM | POA: Diagnosis present

## 2020-02-29 DIAGNOSIS — R0781 Pleurodynia: Secondary | ICD-10-CM | POA: Diagnosis not present

## 2020-02-29 DIAGNOSIS — E785 Hyperlipidemia, unspecified: Secondary | ICD-10-CM | POA: Diagnosis present

## 2020-02-29 DIAGNOSIS — E039 Hypothyroidism, unspecified: Secondary | ICD-10-CM | POA: Diagnosis not present

## 2020-02-29 DIAGNOSIS — J90 Pleural effusion, not elsewhere classified: Secondary | ICD-10-CM | POA: Diagnosis not present

## 2020-02-29 DIAGNOSIS — N179 Acute kidney failure, unspecified: Secondary | ICD-10-CM | POA: Diagnosis present

## 2020-02-29 DIAGNOSIS — E119 Type 2 diabetes mellitus without complications: Secondary | ICD-10-CM | POA: Diagnosis present

## 2020-02-29 DIAGNOSIS — I361 Nonrheumatic tricuspid (valve) insufficiency: Secondary | ICD-10-CM | POA: Diagnosis not present

## 2020-02-29 DIAGNOSIS — I4811 Longstanding persistent atrial fibrillation: Secondary | ICD-10-CM | POA: Diagnosis present

## 2020-02-29 DIAGNOSIS — Z7982 Long term (current) use of aspirin: Secondary | ICD-10-CM | POA: Diagnosis not present

## 2020-02-29 DIAGNOSIS — Z7901 Long term (current) use of anticoagulants: Secondary | ICD-10-CM | POA: Diagnosis not present

## 2020-02-29 DIAGNOSIS — E669 Obesity, unspecified: Secondary | ICD-10-CM | POA: Diagnosis present

## 2020-02-29 DIAGNOSIS — T783XXA Angioneurotic edema, initial encounter: Secondary | ICD-10-CM | POA: Diagnosis present

## 2020-02-29 DIAGNOSIS — I110 Hypertensive heart disease with heart failure: Secondary | ICD-10-CM | POA: Diagnosis present

## 2020-02-29 DIAGNOSIS — J9601 Acute respiratory failure with hypoxia: Secondary | ICD-10-CM | POA: Diagnosis present

## 2020-02-29 DIAGNOSIS — R0602 Shortness of breath: Secondary | ICD-10-CM | POA: Diagnosis not present

## 2020-03-01 ENCOUNTER — Telehealth: Payer: Self-pay | Admitting: Cardiology

## 2020-03-01 DIAGNOSIS — E039 Hypothyroidism, unspecified: Secondary | ICD-10-CM | POA: Diagnosis not present

## 2020-03-01 DIAGNOSIS — J90 Pleural effusion, not elsewhere classified: Secondary | ICD-10-CM | POA: Diagnosis not present

## 2020-03-01 DIAGNOSIS — R0902 Hypoxemia: Secondary | ICD-10-CM | POA: Diagnosis not present

## 2020-03-01 DIAGNOSIS — E1165 Type 2 diabetes mellitus with hyperglycemia: Secondary | ICD-10-CM | POA: Diagnosis not present

## 2020-03-01 DIAGNOSIS — R220 Localized swelling, mass and lump, head: Secondary | ICD-10-CM | POA: Diagnosis not present

## 2020-03-01 DIAGNOSIS — R0781 Pleurodynia: Secondary | ICD-10-CM | POA: Diagnosis not present

## 2020-03-01 DIAGNOSIS — I10 Essential (primary) hypertension: Secondary | ICD-10-CM

## 2020-03-01 NOTE — Telephone Encounter (Signed)
LMTCB   Routed to MD/RN to review Unable to see [facility] d/c summary in epic Will ask if OK to order labs Patient has f/u on 4/20

## 2020-03-01 NOTE — Telephone Encounter (Signed)
Patient was recently admitted to [facility]. The discharge summary said that the patient needs to have a BMET and an electrolyte panel to determine if his kidney function is improving. The wife wanted to know if the patient could have those done at Dr. Marthann Schiller office. The labs need to be drawn on Friday  The patient also wanted to know if Dr. Raliegh Ip would look over the medication changes that were made and see if Dr. Raliegh Ip would agree to the changes. The patient is reluctant to make changes not knowing if Dr. Raliegh Ip is ok with it. Please let the wife konow what the office decides

## 2020-03-02 NOTE — Telephone Encounter (Signed)
Jeremiah Beasley is calling back to update her cell number. She states she realized she gave the wrong number yesterday and wanted to be sure to update it so she can be reached for the callback. Please advise.

## 2020-03-02 NOTE — Telephone Encounter (Signed)
Spoke with patients wife regarding recommendations  She  verbalizes understanding and is agreeable to plan of care. Advised patient/wife to call back with any issues or concerns.   Order for Chem-7 placed.

## 2020-03-02 NOTE — Telephone Encounter (Signed)
It is okay to get Chem-7.  I did look at his medication it is fine.

## 2020-03-03 DIAGNOSIS — K8020 Calculus of gallbladder without cholecystitis without obstruction: Secondary | ICD-10-CM | POA: Diagnosis not present

## 2020-03-03 DIAGNOSIS — I10 Essential (primary) hypertension: Secondary | ICD-10-CM | POA: Diagnosis not present

## 2020-03-03 LAB — BASIC METABOLIC PANEL
BUN/Creatinine Ratio: 22 (ref 10–24)
BUN: 31 mg/dL — ABNORMAL HIGH (ref 8–27)
CO2: 22 mmol/L (ref 20–29)
Calcium: 8.8 mg/dL (ref 8.6–10.2)
Chloride: 103 mmol/L (ref 96–106)
Creatinine, Ser: 1.38 mg/dL — ABNORMAL HIGH (ref 0.76–1.27)
GFR calc Af Amer: 59 mL/min/{1.73_m2} — ABNORMAL LOW (ref >59)
GFR calc non Af Amer: 51 mL/min/{1.73_m2} — ABNORMAL LOW (ref >59)
Glucose: 181 mg/dL — ABNORMAL HIGH (ref 65–99)
Potassium: 4.4 mmol/L (ref 3.5–5.2)
Sodium: 138 mmol/L (ref 134–144)

## 2020-03-07 ENCOUNTER — Encounter: Payer: Self-pay | Admitting: Cardiology

## 2020-03-07 ENCOUNTER — Ambulatory Visit (INDEPENDENT_AMBULATORY_CARE_PROVIDER_SITE_OTHER): Payer: Medicare Other | Admitting: Cardiology

## 2020-03-07 ENCOUNTER — Other Ambulatory Visit: Payer: Self-pay

## 2020-03-07 VITALS — BP 140/70 | HR 42 | Ht 69.0 in | Wt 206.0 lb

## 2020-03-07 DIAGNOSIS — I10 Essential (primary) hypertension: Secondary | ICD-10-CM | POA: Diagnosis not present

## 2020-03-07 DIAGNOSIS — E7800 Pure hypercholesterolemia, unspecified: Secondary | ICD-10-CM

## 2020-03-07 DIAGNOSIS — E1065 Type 1 diabetes mellitus with hyperglycemia: Secondary | ICD-10-CM | POA: Diagnosis not present

## 2020-03-07 DIAGNOSIS — I480 Paroxysmal atrial fibrillation: Secondary | ICD-10-CM | POA: Diagnosis not present

## 2020-03-07 DIAGNOSIS — E119 Type 2 diabetes mellitus without complications: Secondary | ICD-10-CM | POA: Diagnosis not present

## 2020-03-07 DIAGNOSIS — E1040 Type 1 diabetes mellitus with diabetic neuropathy, unspecified: Secondary | ICD-10-CM | POA: Diagnosis not present

## 2020-03-07 MED ORDER — FUROSEMIDE 20 MG PO TABS: 20.0000 mg | ORAL_TABLET | Freq: Every day | ORAL | 3 refills | Status: DC

## 2020-03-07 NOTE — Progress Notes (Signed)
Cardiology Office Note:    Date:  03/07/2020   ID:  Jeremiah Beasley., DOB 02-Mar-1946, MRN [id number]  PCP:  [name], MD  Cardiologist:  [name], MD    Referring MD: [name], [name], MD   Chief Complaint  Patient presents with  . Pre-op Exam  Being in the hospital  History of Present Illness:    Jeremiah Beasley. is a 74 y.o. male past medical history significant for paroxysmal atrial fibrillation, diabetes, hyperlipidemia, hypertension.  Recently he ended up going to the hospital because of tongue swelling, it was felt to be related to ACE inhibitor, ACE inhibitor has been discontinued and that he was discharged home.  Also he was find to have gallstones and he is supposed to for discussion about surgery.  He spoke to surgeon and decision has been made not to pursue surgical intervention.  He was referred to me today to do preop evaluation.  Obviously now since we do not have surgery scheduled there is no need to do that.  However he complained of being tired and short of breath.  He is taking amiodarone which I will discontinue because of his bradycardia his heart rate today is 42.  I have already appointment with him within next 2 weeks at that time we will see what his heart rate is and he will most likely required monitor for about a week or 2 to check his heart rate viability as well as bradycardia.  We will continue anticoagulation.  Past Medical History:  Diagnosis Date  . Atrial fibrillation ([place])   . Diabetes mellitus without complication ([place])   . Essential hypertension 05/15/2015  . Hyperlipidemia   . Hypertension   . Paroxysmal atrial fibrillation ([place]) 05/15/2015  . Pure hypercholesterolemia 05/15/2015  . Type 2 diabetes mellitus without complication ([place] [facility]) [id number]    Past Surgical History:  Procedure Laterality Date  . NO PAST SURGERIES      Current Medications: Current Meds  Medication Sig  . amiodarone (PACERONE) 200 MG tablet TAKE 1/2 TABLET BY  MOUTH AT 6 AM AS DIRECTED BY DOCTOR  . amLODipine (NORVASC) 10 MG tablet Take 10 mg by mouth daily.  Marland Kitchen aspirin EC 81 MG tablet Take 81 mg by mouth daily.  . famotidine (PEPCID) 20 MG tablet Take 20 mg by mouth 2 (two) times daily.  Marland Kitchen gabapentin (NEURONTIN) 300 MG capsule Take 300 mg by mouth as needed.   . hydrALAZINE (APRESOLINE) 50 MG tablet Take 75 mg by mouth daily.   Marland Kitchen levothyroxine (SYNTHROID, LEVOTHROID) 75 MCG tablet Take 1 tablet by mouth daily.  Marland Kitchen NOVOLOG 100 UNIT/ML injection TO BE USED IN INSULIN PUMP AS DIRECTED DX  E10.40  . rivaroxaban (XARELTO) 20 MG TABS tablet Take 1 tablet by mouth daily.  . simvastatin (ZOCOR) 80 MG tablet Take 80 mg by mouth daily at 6 PM.      Allergies:   Patient has no known allergies.   Social History   Socioeconomic History  . Marital status: Married    Spouse name: Not on file  . Number of children: Not on file  . Years of education: Not on file  . Highest education level: Not on file  Occupational History  . Not on file  Tobacco Use  . Smoking status: Never Smoker  . Smokeless tobacco: Current User  Substance and Sexual Activity  . Alcohol use: No  . Drug use: No  . Sexual activity: Not on file  Other Topics  Concern  . Not on file  Social History Narrative  . Not on file   Social Determinants of Health   Financial Resource Strain:   . Difficulty of Paying Living Expenses:   Food Insecurity:   . Worried About Charity fundraiser in the Last Year:   . Arboriculturist in the Last Year:   Transportation Needs:   . Film/video editor (Medical):   Marland Kitchen Lack of Transportation (Non-Medical):   Physical Activity:   . Days of Exercise per Week:   . Minutes of Exercise per Session:   Stress:   . Feeling of Stress :   Social Connections:   . Frequency of Communication with Friends and Family:   . Frequency of Social Gatherings with Friends and Family:   . Attends Religious Services:   . Active Member of Clubs or Organizations:     . Attends Archivist Meetings:   Marland Kitchen Marital Status:      Family History: The patient's family history includes Heart attack in his father; Hyperlipidemia in his father; Stroke in his father. ROS:   Please see the history of present illness.    All 14 point review of systems negative except as described per history of present illness  EKGs/Labs/Other Studies Reviewed:      Recent Labs: 03/03/2020: BUN 31; Creatinine, Ser 1.38; Potassium 4.4; Sodium 138  Recent Lipid Panel No results found for: CHOL, TRIG, HDL, CHOLHDL, VLDL, LDLCALC, LDLDIRECT  Physical Exam:    VS:  BP 140/70   Pulse (!) 42   Ht 5\' 9"  (1.753 m)   Wt 206 lb (93.4 kg)   SpO2 98%   BMI 30.42 kg/m     Wt Readings from Last 3 Encounters:  03/07/20 206 lb (93.4 kg)  07/27/19 206 lb (93.4 kg)  01/21/19 210 lb 6.4 oz (95.4 kg)     GEN:  Well nourished, well developed in no acute distress HEENT: Normal NECK: No JVD; No carotid bruits LYMPHATICS: No lymphadenopathy CARDIAC: Bradycardia RRR, no murmurs, no rubs, no gallops RESPIRATORY:  Clear to auscultation without rales, wheezing or rhonchi  ABDOMEN: Soft, non-tender, non-distended MUSCULOSKELETAL:  No edema; No deformity  SKIN: Warm and dry LOWER EXTREMITIES: no swelling NEUROLOGIC:  Alert and oriented x 3 PSYCHIATRIC:  Normal affect   ASSESSMENT:    1. Essential hypertension   2. Paroxysmal atrial fibrillation (HCC)   3. Pure hypercholesterolemia   4. Type 2 diabetes mellitus without complication, without long-term current use of insulin (HCC)    PLAN:    In order of problems listed above:  1. Essential hypertension blood pressure is slightly elevated today however he is Enalapril has been discontinued.  Will wait for another 2 weeks before making decision about choice of medication. 2. Paroxysmal atrial fibrillation maintaining sinus rhythm continue anticoagulation amiodarone discontinued because of bradycardia 3. Dyslipidemia.  We  will continue with simvastatin. 4. Type 2 diabetes followed by internal medicine team. 5. Swelling of lower extremities will restart small dose of diuretic.  He will be on 20 mg of Lasix daily and will check his Chem-7 when I see him next time in the office   Medication Adjustments/Labs and Tests Ordered: Current medicines are reviewed at length with the patient today.  Concerns regarding medicines are outlined above.  Orders Placed This Encounter  Procedures  . EKG 12-Lead   Medication changes: No orders of the defined types were placed in this encounter.   Signed, Marily Lente.  Agustin Cree, MD, [facility] 03/07/2020 10:44 AM    [place]

## 2020-03-07 NOTE — Patient Instructions (Addendum)
Medication Instructions:  Your physician has recommended you make the following change in your medication:  1.  STOP the Amiodarone 2.  RESTART Lasix 20 mg daily    *If you need a refill on your cardiac medications before your next appointment, please call your pharmacy*   Lab Work: None ordered  If you have labs (blood work) drawn today and your tests are completely normal, you will receive your results only by: Marland Kitchen MyChart Message (if you have MyChart) OR . A paper copy in the mail If you have any lab test that is abnormal or we need to change your treatment, we will call you to review the results.   Testing/Procedures: None ordered   Follow-Up: At [facility], you and your health needs are our priority.  As part of our continuing mission to provide you with exceptional heart care, we have created designated Provider Care Teams.  These Care Teams include your primary Cardiologist (physician) and Advanced Practice Providers (APPs -  Physician Assistants and Nurse Practitioners) who all work together to provide you with the care you need, when you need it.  We recommend signing up for the patient portal called "MyChart".  Sign up information is provided on this After Visit Summary.  MyChart is used to connect with patients for Virtual Visits (Telemedicine).  Patients are able to view lab/test results, encounter notes, upcoming appointments, etc.  Non-urgent messages can be sent to your provider as well.   To learn more about what you can do with MyChart, go to [email or web address].    Your next appointment:   As scheduled in May   The format for your next appointment:   In Person  Provider:   [name], MD   Other Instructions

## 2020-03-11 DIAGNOSIS — J189 Pneumonia, unspecified organism: Secondary | ICD-10-CM | POA: Diagnosis not present

## 2020-03-11 DIAGNOSIS — R05 Cough: Secondary | ICD-10-CM | POA: Diagnosis not present

## 2020-03-16 DIAGNOSIS — E1040 Type 1 diabetes mellitus with diabetic neuropathy, unspecified: Secondary | ICD-10-CM | POA: Diagnosis not present

## 2020-03-16 DIAGNOSIS — E1065 Type 1 diabetes mellitus with hyperglycemia: Secondary | ICD-10-CM | POA: Diagnosis not present

## 2020-03-21 DIAGNOSIS — J189 Pneumonia, unspecified organism: Secondary | ICD-10-CM | POA: Diagnosis not present

## 2020-03-21 DIAGNOSIS — N1831 Chronic kidney disease, stage 3a: Secondary | ICD-10-CM | POA: Diagnosis not present

## 2020-03-23 ENCOUNTER — Ambulatory Visit (INDEPENDENT_AMBULATORY_CARE_PROVIDER_SITE_OTHER): Payer: Medicare Other | Admitting: Cardiology

## 2020-03-23 ENCOUNTER — Encounter: Payer: Self-pay | Admitting: Cardiology

## 2020-03-23 ENCOUNTER — Other Ambulatory Visit: Payer: Self-pay

## 2020-03-23 VITALS — BP 130/64 | HR 67 | Ht 69.0 in | Wt 198.0 lb

## 2020-03-23 DIAGNOSIS — I10 Essential (primary) hypertension: Secondary | ICD-10-CM | POA: Diagnosis not present

## 2020-03-23 DIAGNOSIS — E7800 Pure hypercholesterolemia, unspecified: Secondary | ICD-10-CM

## 2020-03-23 DIAGNOSIS — E119 Type 2 diabetes mellitus without complications: Secondary | ICD-10-CM | POA: Diagnosis not present

## 2020-03-23 DIAGNOSIS — I480 Paroxysmal atrial fibrillation: Secondary | ICD-10-CM

## 2020-03-23 NOTE — Progress Notes (Signed)
Cardiology Office Note:    Date:  03/23/2020   ID:  Jeremiah Beasley., DOB July 02, 1946, MRN [id number]  PCP:  [name], MD  Cardiologist:  [name], MD    Referring MD: [name], [name], MD   No chief complaint on file. I am doing better now  History of Present Illness:    Jeremiah Beasley. is a 74 y.o. male past medical history significant for paroxysmal ablation this was suppressed with amiodarone but lately because of bradycardia amiodarone has been discontinued, diabetes, hyperlipidemia, hypertension.  Recently he ended up in round of hospital because of swelling of the tongue.  It was related to ACE inhibitor, ACE inhibitor has been discontinued and he was sent home.  He was also find to have significant gallbladder disease however decision has been made not to pursue surgical intervention.  In the meantime since I seen him last on him because pneumonia treated with antibiotic and doing better right now.  Denies have any heart issue.  Denies have any palpitations overall seems to be doing better from heart point of view.  No dizziness no passing out no palpitations.  Past Medical History:  Diagnosis Date  . Atrial fibrillation ([place])   . Diabetes mellitus without complication ([place])   . Essential hypertension 05/15/2015  . Hyperlipidemia   . Hypertension   . Paroxysmal atrial fibrillation ([place]) 05/15/2015  . Pure hypercholesterolemia 05/15/2015  . Type 2 diabetes mellitus without complication ([place]) 123456    Past Surgical History:  Procedure Laterality Date  . NO PAST SURGERIES      Current Medications: Current Meds  Medication Sig  . amLODipine (NORVASC) 10 MG tablet Take 10 mg by mouth daily.  Marland Kitchen aspirin EC 81 MG tablet Take 81 mg by mouth daily.  . famotidine (PEPCID) 20 MG tablet Take 20 mg by mouth 2 (two) times daily.  . furosemide (LASIX) 20 MG tablet Take 1 tablet (20 mg total) by mouth daily.  Marland Kitchen gabapentin (NEURONTIN) 300 MG capsule Take 300 mg by mouth  as needed.   . hydrALAZINE (APRESOLINE) 50 MG tablet Take 75 mg by mouth daily.   Marland Kitchen levothyroxine (SYNTHROID, LEVOTHROID) 75 MCG tablet Take 1 tablet by mouth daily.  Marland Kitchen NOVOLOG 100 UNIT/ML injection TO BE USED IN INSULIN PUMP AS DIRECTED DX  E10.40  . ondansetron (ZOFRAN-ODT) 4 MG disintegrating tablet   . PERCOCET 5-325 MG tablet Take 1 tablet by mouth every 4 (four) hours as needed.  . potassium chloride SA (KLOR-CON) 20 MEQ tablet Take 20 mEq by mouth daily.  . predniSONE (DELTASONE) 50 MG tablet Take 50 mg by mouth daily.  . rivaroxaban (XARELTO) 20 MG TABS tablet Take 1 tablet by mouth daily.  . simvastatin (ZOCOR) 80 MG tablet Take 80 mg by mouth daily at 6 PM.      Allergies:   Patient has no known allergies.   Social History   Socioeconomic History  . Marital status: Married    Spouse name: Not on file  . Number of children: Not on file  . Years of education: Not on file  . Highest education level: Not on file  Occupational History  . Not on file  Tobacco Use  . Smoking status: Never Smoker  . Smokeless tobacco: Current User  Substance and Sexual Activity  . Alcohol use: No  . Drug use: No  . Sexual activity: Not on file  Other Topics Concern  . Not on file  Social History Narrative  .  Not on file   Social Determinants of Health   Financial Resource Strain:   . Difficulty of Paying Living Expenses:   Food Insecurity:   . Worried About Charity fundraiser in the Last Year:   . Arboriculturist in the Last Year:   Transportation Needs:   . Film/video editor (Medical):   Marland Kitchen Lack of Transportation (Non-Medical):   Physical Activity:   . Days of Exercise per Week:   . Minutes of Exercise per Session:   Stress:   . Feeling of Stress :   Social Connections:   . Frequency of Communication with Friends and Family:   . Frequency of Social Gatherings with Friends and Family:   . Attends Religious Services:   . Active Member of Clubs or Organizations:   .  Attends Archivist Meetings:   Marland Kitchen Marital Status:      Family History: The patient's family history includes Heart attack in his father; Hyperlipidemia in his father; Stroke in his father. ROS:   Please see the history of present illness.    All 14 point review of systems negative except as described per history of present illness  EKGs/Labs/Other Studies Reviewed:      Recent Labs: 03/03/2020: BUN 31; Creatinine, Ser 1.38; Potassium 4.4; Sodium 138  Recent Lipid Panel No results found for: CHOL, TRIG, HDL, CHOLHDL, VLDL, LDLCALC, LDLDIRECT  Physical Exam:    VS:  BP 130/64   Pulse 67   Ht 5\' 9"  (1.753 m)   Wt 198 lb (89.8 kg)   SpO2 97%   BMI 29.24 kg/m     Wt Readings from Last 3 Encounters:  03/23/20 198 lb (89.8 kg)  03/07/20 206 lb (93.4 kg)  07/27/19 206 lb (93.4 kg)     GEN:  Well nourished, well developed in no acute distress HEENT: Normal NECK: No JVD; No carotid bruits LYMPHATICS: No lymphadenopathy CARDIAC: RRR, no murmurs, no rubs, no gallops RESPIRATORY:  Clear to auscultation without rales, wheezing or rhonchi  ABDOMEN: Soft, non-tender, non-distended MUSCULOSKELETAL:  No edema; No deformity  SKIN: Warm and dry LOWER EXTREMITIES: no swelling NEUROLOGIC:  Alert and oriented x 3 PSYCHIATRIC:  Normal affect   ASSESSMENT:    1. Paroxysmal atrial fibrillation (HCC)   2. Essential hypertension   3. Type 2 diabetes mellitus without complication, without long-term current use of insulin ([place])   4. Pure hypercholesterolemia    PLAN:    In order of problems listed above:  1. Paroxysmal atrial fibrillation anticoagulated which I will continue.  Amiodarone discontinued because of bradycardia we will continue monitoring. 2. Essential hypertension blood pressure well controlled continue present management. 3. Type 2 diabetes stable from that point review doing well. 4. Dyslipidemia he is on simvastatin 80 which I will continue.   Medication  Adjustments/Labs and Tests Ordered: Current medicines are reviewed at length with the patient today.  Concerns regarding medicines are outlined above.  No orders of the defined types were placed in this encounter.  Medication changes: No orders of the defined types were placed in this encounter.   Signed, Park Liter, MD, [facility] 03/23/2020 8:58 AM    [place]

## 2020-03-23 NOTE — Patient Instructions (Signed)
Medication Instructions:  Your physician recommends that you continue on your current medications as directed. Please refer to the Current Medication list given to you today.  *If you need a refill on your cardiac medications before your next appointment, please call your pharmacy*   Lab Work: None If you have labs (blood work) drawn today and your tests are completely normal, you will receive your results only by: MyChart Message (if you have MyChart) OR A paper copy in the mail If you have any lab test that is abnormal or we need to change your treatment, we will call you to review the results.   Testing/Procedures: None   Follow-Up: At [facility], you and your health needs are our priority.  As part of our continuing mission to provide you with exceptional heart care, we have created designated Provider Care Teams.  These Care Teams include your primary Cardiologist (physician) and Advanced Practice Providers (APPs -  Physician Assistants and Nurse Practitioners) who all work together to provide you with the care you need, when you need it.  We recommend signing up for the patient portal called "MyChart".  Sign up information is provided on this After Visit Summary.  MyChart is used to connect with patients for Virtual Visits (Telemedicine).  Patients are able to view lab/test results, encounter notes, upcoming appointments, etc.  Non-urgent messages can be sent to your provider as well.   To learn more about what you can do with MyChart, go to [email or web address].    Your next appointment:   2 month(s)  The format for your next appointment:   In Person  Provider:   Cote Krasowski, MD   Other Instructions   

## 2020-03-28 DIAGNOSIS — M1711 Unilateral primary osteoarthritis, right knee: Secondary | ICD-10-CM | POA: Diagnosis not present

## 2020-03-28 DIAGNOSIS — M25561 Pain in right knee: Secondary | ICD-10-CM | POA: Diagnosis not present

## 2020-03-31 DIAGNOSIS — M25561 Pain in right knee: Secondary | ICD-10-CM | POA: Diagnosis not present

## 2020-03-31 DIAGNOSIS — M23321 Other meniscus derangements, posterior horn of medial meniscus, right knee: Secondary | ICD-10-CM | POA: Diagnosis not present

## 2020-03-31 DIAGNOSIS — M23331 Other meniscus derangements, other medial meniscus, right knee: Secondary | ICD-10-CM | POA: Diagnosis not present

## 2020-04-11 DIAGNOSIS — G8929 Other chronic pain: Secondary | ICD-10-CM

## 2020-04-11 DIAGNOSIS — M25561 Pain in right knee: Secondary | ICD-10-CM | POA: Diagnosis not present

## 2020-04-11 HISTORY — DX: Other chronic pain: G89.29

## 2020-04-19 DIAGNOSIS — G8918 Other acute postprocedural pain: Secondary | ICD-10-CM | POA: Diagnosis not present

## 2020-04-19 DIAGNOSIS — M6751 Plica syndrome, right knee: Secondary | ICD-10-CM | POA: Diagnosis not present

## 2020-04-19 DIAGNOSIS — X58XXXA Exposure to other specified factors, initial encounter: Secondary | ICD-10-CM | POA: Diagnosis not present

## 2020-04-19 DIAGNOSIS — M94261 Chondromalacia, right knee: Secondary | ICD-10-CM | POA: Diagnosis not present

## 2020-04-19 DIAGNOSIS — Y999 Unspecified external cause status: Secondary | ICD-10-CM | POA: Diagnosis not present

## 2020-04-19 DIAGNOSIS — M659 Synovitis and tenosynovitis, unspecified: Secondary | ICD-10-CM | POA: Diagnosis not present

## 2020-04-19 DIAGNOSIS — S83231A Complex tear of medial meniscus, current injury, right knee, initial encounter: Secondary | ICD-10-CM | POA: Diagnosis not present

## 2020-04-25 DIAGNOSIS — Z9889 Other specified postprocedural states: Secondary | ICD-10-CM

## 2020-04-25 HISTORY — DX: Other specified postprocedural states: Z98.890

## 2020-04-26 DIAGNOSIS — M25561 Pain in right knee: Secondary | ICD-10-CM | POA: Diagnosis not present

## 2020-04-27 DIAGNOSIS — Z794 Long term (current) use of insulin: Secondary | ICD-10-CM | POA: Diagnosis not present

## 2020-04-27 DIAGNOSIS — E1065 Type 1 diabetes mellitus with hyperglycemia: Secondary | ICD-10-CM | POA: Diagnosis not present

## 2020-04-27 DIAGNOSIS — E1040 Type 1 diabetes mellitus with diabetic neuropathy, unspecified: Secondary | ICD-10-CM | POA: Diagnosis not present

## 2020-05-01 DIAGNOSIS — M25561 Pain in right knee: Secondary | ICD-10-CM | POA: Diagnosis not present

## 2020-05-05 DIAGNOSIS — M25561 Pain in right knee: Secondary | ICD-10-CM | POA: Diagnosis not present

## 2020-05-31 ENCOUNTER — Ambulatory Visit (INDEPENDENT_AMBULATORY_CARE_PROVIDER_SITE_OTHER): Payer: Medicare Other | Admitting: Cardiology

## 2020-05-31 ENCOUNTER — Encounter: Payer: Self-pay | Admitting: Cardiology

## 2020-05-31 ENCOUNTER — Other Ambulatory Visit: Payer: Self-pay

## 2020-05-31 VITALS — BP 114/60 | HR 56 | Ht 69.0 in | Wt 200.0 lb

## 2020-05-31 DIAGNOSIS — E7800 Pure hypercholesterolemia, unspecified: Secondary | ICD-10-CM

## 2020-05-31 DIAGNOSIS — I10 Essential (primary) hypertension: Secondary | ICD-10-CM | POA: Diagnosis not present

## 2020-05-31 DIAGNOSIS — I480 Paroxysmal atrial fibrillation: Secondary | ICD-10-CM | POA: Diagnosis not present

## 2020-05-31 DIAGNOSIS — E119 Type 2 diabetes mellitus without complications: Secondary | ICD-10-CM | POA: Diagnosis not present

## 2020-05-31 NOTE — Patient Instructions (Signed)
Medication Instructions:  Your physician recommends that you continue on your current medications as directed. Please refer to the Current Medication list given to you today.  *If you need a refill on your cardiac medications before your next appointment, please call your pharmacy*   Lab Work: Your physician recommends that you return for lab work today: lipid  If you have labs (blood work) drawn today and your tests are completely normal, you will receive your results only by: . MyChart Message (if you have MyChart) OR . A paper copy in the mail If you have any lab test that is abnormal or we need to change your treatment, we will call you to review the results.   Testing/Procedures: None   Follow-Up: At [facility], you and your health needs are our priority.  As part of our continuing mission to provide you with exceptional heart care, we have created designated Provider Care Teams.  These Care Teams include your primary Cardiologist (physician) and Advanced Practice Providers (APPs -  Physician Assistants and Nurse Practitioners) who all work together to provide you with the care you need, when you need it.  We recommend signing up for the patient portal called "MyChart".  Sign up information is provided on this After Visit Summary.  MyChart is used to connect with patients for Virtual Visits (Telemedicine).  Patients are able to view lab/test results, encounter notes, upcoming appointments, etc.  Non-urgent messages can be sent to your provider as well.   To learn more about what you can do with MyChart, go to [email or web address].    Your next appointment:   5 month(s)  The format for your next appointment:   In Person  Provider:   Semir Krasowski, MD   Other Instructions    

## 2020-05-31 NOTE — Progress Notes (Signed)
Cardiology Office Note:    Date:  05/31/2020   ID:  Jeremiah Sheriff., DOB [birth year], MRN [id number]  PCP:  [name], MD  Cardiologist:  [name], MD    Referring MD: [name], [name], MD   No chief complaint on file. Doing well I do not have any palpitations  History of Present Illness:    Jeremiah Beasley. is a 74 y.o. male   past medical history significant for paroxysmal ablation this was suppressed with amiodarone but lately because of bradycardia amiodarone has been discontinued, diabetes, hyperlipidemia, hypertension.  Recently he ended up in round of hospital because of swelling of the tongue.  It was related to ACE inhibitor, ACE inhibitor has been discontinued and he was sent home.  He was also find to have significant gallbladder disease however decision has been made not to pursue surgical intervention.  Comes today to my office for follow-up doing well.  Denies have any chest pain tightness squeezing pressure burning chest no palpitations.  He has any questions today about aspirin.  We did calculate his 10-year risk for atherosclerotic coronary vascular disease which was 34% therefore I recommended continuation of aspirin. Would Past Medical History:  Diagnosis Date  . Atrial fibrillation ([place])   . Diabetes mellitus without complication ([place])   . Essential hypertension 05/15/2015  . Hyperlipidemia   . Hypertension   . Paroxysmal atrial fibrillation ([place]) 05/15/2015  . Pure hypercholesterolemia 05/15/2015  . Type 2 diabetes mellitus without complication ([place]) 1/47/8295    Past Surgical History:  Procedure Laterality Date  . NO PAST SURGERIES      Current Medications: Current Meds  Medication Sig  . amLODipine (NORVASC) 10 MG tablet Take 10 mg by mouth daily.  Marland Kitchen aspirin EC 81 MG tablet Take 81 mg by mouth daily.  . famotidine (PEPCID) 20 MG tablet Take 20 mg by mouth 2 (two) times daily.  . furosemide (LASIX) 20 MG tablet Take 1 tablet (20 mg total) by  mouth daily.  Marland Kitchen gabapentin (NEURONTIN) 300 MG capsule Take 300 mg by mouth as needed.   . hydrALAZINE (APRESOLINE) 50 MG tablet Take 75 mg by mouth daily.   Marland Kitchen levothyroxine (SYNTHROID, LEVOTHROID) 75 MCG tablet Take 1 tablet by mouth daily.  Marland Kitchen NOVOLOG 100 UNIT/ML injection TO BE USED IN INSULIN PUMP AS DIRECTED DX  E10.40  . ondansetron (ZOFRAN-ODT) 4 MG disintegrating tablet   . PERCOCET 5-325 MG tablet Take 1 tablet by mouth every 4 (four) hours as needed.  . potassium chloride SA (KLOR-CON) 20 MEQ tablet Take 20 mEq by mouth daily.  . predniSONE (DELTASONE) 50 MG tablet Take 50 mg by mouth daily.  . rivaroxaban (XARELTO) 20 MG TABS tablet Take 1 tablet by mouth daily.  . simvastatin (ZOCOR) 80 MG tablet Take 80 mg by mouth daily at 6 PM.      Allergies:   Patient has no known allergies.   Social History   Socioeconomic History  . Marital status: Married    Spouse name: Not on file  . Number of children: Not on file  . Years of education: Not on file  . Highest education level: Not on file  Occupational History  . Not on file  Tobacco Use  . Smoking status: Never Smoker  . Smokeless tobacco: Current User  Vaping Use  . Vaping Use: Never used  Substance and Sexual Activity  . Alcohol use: No  . Drug use: No  . Sexual activity: Not on  file  Other Topics Concern  . Not on file  Social History Narrative  . Not on file   Social Determinants of Health   Financial Resource Strain:   . Difficulty of Paying Living Expenses:   Food Insecurity:   . Worried About Charity fundraiser in the Last Year:   . Arboriculturist in the Last Year:   Transportation Needs:   . Film/video editor (Medical):   Marland Kitchen Lack of Transportation (Non-Medical):   Physical Activity:   . Days of Exercise per Week:   . Minutes of Exercise per Session:   Stress:   . Feeling of Stress :   Social Connections:   . Frequency of Communication with Friends and Family:   . Frequency of Social Gatherings  with Friends and Family:   . Attends Religious Services:   . Active Member of Clubs or Organizations:   . Attends Archivist Meetings:   Marland Kitchen Marital Status:      Family History: The patient's family history includes Heart attack in his father; Hyperlipidemia in his father; Stroke in his father. ROS:   Please see the history of present illness.    All 14 point review of systems negative except as described per history of present illness  EKGs/Labs/Other Studies Reviewed:      Recent Labs: 03/03/2020: BUN 31; Creatinine, Ser 1.38; Potassium 4.4; Sodium 138  Recent Lipid Panel No results found for: CHOL, TRIG, HDL, CHOLHDL, VLDL, LDLCALC, LDLDIRECT  Physical Exam:    VS:  BP 114/60   Pulse (!) 56   Ht 5\' 9"  (1.753 m)   Wt 200 lb (90.7 kg)   SpO2 96%   BMI 29.53 kg/m     Wt Readings from Last 3 Encounters:  05/31/20 200 lb (90.7 kg)  03/23/20 198 lb (89.8 kg)  03/07/20 206 lb (93.4 kg)     GEN:  Well nourished, well developed in no acute distress HEENT: Normal NECK: No JVD; No carotid bruits LYMPHATICS: No lymphadenopathy CARDIAC: RRR, no murmurs, no rubs, no gallops RESPIRATORY:  Clear to auscultation without rales, wheezing or rhonchi  ABDOMEN: Soft, non-tender, non-distended MUSCULOSKELETAL:  No edema; No deformity  SKIN: Warm and dry LOWER EXTREMITIES: no swelling NEUROLOGIC:  Alert and oriented x 3 PSYCHIATRIC:  Normal affect   ASSESSMENT:    1. Paroxysmal atrial fibrillation (HCC)   2. Essential hypertension   3. Type 2 diabetes mellitus without complication, without long-term current use of insulin ([place])   4. Pure hypercholesterolemia    PLAN:    In order of problems listed above:  1. Paroxysmal atrial fibrillation off amiodarone denies having any issue no palpitations 2. When I see him which will be in 5 months we will put monitoring his BP at the next appointment paroxysmal atrial fibrillation.  He is anticoagulated which I will  continue. 3. Essential hypertension blood pressure well controlled 114/60 today. 4. Type 2 diabetes followed by internal medicine team.  I did review K PN 14 5. See his last hemoglobin A1c from November 2020 which was 7.5.  He does not need to take better care of his diabetes. 6. Dyslipidemia: I do have her last test from January.  His LDL is 79 and HDL 32.  He is on simvastatin 80 mg which I will continue for now.   Medication Adjustments/Labs and Tests Ordered: Current medicines are reviewed at length with the patient today.  Concerns regarding medicines are outlined above.  No orders  of the defined types were placed in this encounter.  Medication changes: No orders of the defined types were placed in this encounter.   Signed, Park Liter, MD, [facility] 05/31/2020 1:20 PM    [place]

## 2020-06-01 LAB — LIPID PANEL
Chol/HDL Ratio: 4.2 ratio (ref 0.0–5.0)
Cholesterol, Total: 134 mg/dL (ref 100–199)
HDL: 32 mg/dL — ABNORMAL LOW (ref >39)
LDL Chol Calc (NIH): 86 mg/dL (ref 0–99)
Triglycerides: 82 mg/dL (ref 0–149)
VLDL Cholesterol Cal: 16 mg/dL (ref 5–40)

## 2020-06-07 ENCOUNTER — Ambulatory Visit: Payer: Medicare Other | Admitting: Cardiology

## 2020-06-07 ENCOUNTER — Telehealth: Payer: Self-pay

## 2020-06-07 NOTE — Telephone Encounter (Signed)
Left message on patients voicemail to please return our call.   

## 2020-06-07 NOTE — Telephone Encounter (Signed)
-----   Message from Park Liter, MD sent at 06/07/2020 12:13 PM EDT ----- Cholesterol still on the higher side.  Please add Zetia 10 mg to medical regimen.  Fasting lipid profile 6 weeks

## 2020-06-08 ENCOUNTER — Telehealth: Payer: Self-pay

## 2020-06-08 DIAGNOSIS — E7800 Pure hypercholesterolemia, unspecified: Secondary | ICD-10-CM

## 2020-06-08 MED ORDER — EZETIMIBE 10 MG PO TABS: 10.0000 mg | ORAL_TABLET | Freq: Every day | ORAL | 3 refills | Status: AC

## 2020-06-08 NOTE — Telephone Encounter (Signed)
Spoke with patient regarding results and recommendation.  Patient verbalizes understanding and is agreeable to plan of care. Advised patient to call back with any issues or concerns.  

## 2020-06-08 NOTE — Telephone Encounter (Signed)
-----   Message from Park Liter, MD sent at 06/07/2020 12:13 PM EDT ----- Cholesterol still on the higher side.  Please add Zetia 10 mg to medical regimen.  Fasting lipid profile 6 weeks

## 2020-06-08 NOTE — Telephone Encounter (Signed)
Spoke to patient just now and he wanted to go over the exact numbers of his lab results again so that he could write them down. We did this and he thanked me for the call back.   Encouraged patient to call back with any questions or concerns.

## 2020-06-08 NOTE — Telephone Encounter (Signed)
Patient calling back.   °

## 2020-06-27 DIAGNOSIS — M1711 Unilateral primary osteoarthritis, right knee: Secondary | ICD-10-CM | POA: Diagnosis not present

## 2020-06-27 DIAGNOSIS — Z96651 Presence of right artificial knee joint: Secondary | ICD-10-CM | POA: Diagnosis not present

## 2020-06-27 DIAGNOSIS — M25461 Effusion, right knee: Secondary | ICD-10-CM | POA: Diagnosis not present

## 2020-07-04 ENCOUNTER — Telehealth: Payer: Self-pay | Admitting: Cardiology

## 2020-07-04 NOTE — Telephone Encounter (Signed)
New message:    Patient calling and would like to speak with some one he states he has some questions. Please call patiernt.

## 2020-07-04 NOTE — Telephone Encounter (Signed)
LVMTCB

## 2020-07-11 DIAGNOSIS — E1065 Type 1 diabetes mellitus with hyperglycemia: Secondary | ICD-10-CM | POA: Diagnosis not present

## 2020-07-11 DIAGNOSIS — E1040 Type 1 diabetes mellitus with diabetic neuropathy, unspecified: Secondary | ICD-10-CM | POA: Diagnosis not present

## 2020-07-13 DIAGNOSIS — E7800 Pure hypercholesterolemia, unspecified: Secondary | ICD-10-CM | POA: Diagnosis not present

## 2020-07-13 DIAGNOSIS — E1040 Type 1 diabetes mellitus with diabetic neuropathy, unspecified: Secondary | ICD-10-CM | POA: Diagnosis not present

## 2020-07-13 DIAGNOSIS — I10 Essential (primary) hypertension: Secondary | ICD-10-CM | POA: Diagnosis not present

## 2020-07-13 DIAGNOSIS — J329 Chronic sinusitis, unspecified: Secondary | ICD-10-CM | POA: Diagnosis not present

## 2020-09-12 DIAGNOSIS — M1711 Unilateral primary osteoarthritis, right knee: Secondary | ICD-10-CM | POA: Diagnosis not present

## 2020-09-13 ENCOUNTER — Telehealth: Payer: Self-pay

## 2020-09-13 DIAGNOSIS — Z01818 Encounter for other preprocedural examination: Secondary | ICD-10-CM

## 2020-09-13 DIAGNOSIS — I480 Paroxysmal atrial fibrillation: Secondary | ICD-10-CM

## 2020-09-13 NOTE — Telephone Encounter (Signed)
Pharmacy, please comment on Xarelto. Thanks!   I did see they were only requesting medical clearance however patient is on Aspirin and Xarelto

## 2020-09-13 NOTE — Telephone Encounter (Signed)
Dr. Agustin Cree, this patient is needing cardiac clearance for TKA. He is on Xarelto for afib and Aspirin for CAD risk. You last saw him 05/31/20 and due to 10-year risk for ASCAD of 34% continuation of aspirin was recommended. It does not appear he has history of prior cardiac events.   Per pharmacy okay to hold Xarelto 3 days prior to procedure. Wondering if there are any specific recommendations for Aspirin.   Please direct reply to P CV DIV PREOP  Thanks,  Saeed Toren Kathlen Mody, PA-C

## 2020-09-13 NOTE — Telephone Encounter (Signed)
Patient with diagnosis of afib on Xarelto for anticoagulation.    Procedure: TKA Date of procedure: TBD    CHA2DS2-VASc Score = 3  This indicates a 3.2% annual risk of stroke. The patient's score is based upon: CHF History: 0 HTN History: 1 Diabetes History: 1 Stroke History: 0 Vascular Disease History: 0 Age Score: 1 Gender Score: 0    CrCl 49 ml/min  Per office protocol, patient can hold Xarelto for 3 days prior to procedure.

## 2020-09-13 NOTE — Telephone Encounter (Signed)
   [facility] Pre-operative Risk Assessment    HEARTCARE STAFF: - Please ensure there is not already an duplicate clearance open for this procedure. - Under Visit Info/Reason for Call, type in Other and utilize the format Clearance MM/DD/YY or Clearance TBD. Do not use dashes or single digits. - If request is for dental extraction, please clarify the # of teeth to be extracted.  Request for surgical clearance:  1. What type of surgery is being performed? TKA   2. When is this surgery scheduled? TBD   3. What type of clearance is required (medical clearance vs. Pharmacy clearance to hold med vs. Both)? Medical  4. Are there any medications that need to be held prior to surgery and how long?   5. Practice name and name of physician performing surgery? [facility] [place]   6. What is the office phone number? [phone]   7.   What is the office fax number? [phone]  8.   Anesthesia type (None, local, MAC, general) ?    Lowella Grip 09/13/2020, 11:27 AM  _________________________________________________________________   (provider comments below)

## 2020-09-14 NOTE — Addendum Note (Signed)
Addended by: Senaida Ores on: 09/14/2020 03:00 PM   Modules accepted: Orders

## 2020-09-14 NOTE — Telephone Encounter (Signed)
Called patient informed him of his stress test appointment next week and instructions he verbally understood no further questions.

## 2020-09-14 NOTE — Telephone Encounter (Signed)
Please schedule him for [facility] before surgery.  We need to make sure he does not have any obstructive disease.

## 2020-09-18 DIAGNOSIS — I10 Essential (primary) hypertension: Secondary | ICD-10-CM | POA: Diagnosis not present

## 2020-09-18 DIAGNOSIS — Z01818 Encounter for other preprocedural examination: Secondary | ICD-10-CM | POA: Diagnosis not present

## 2020-09-18 DIAGNOSIS — Z23 Encounter for immunization: Secondary | ICD-10-CM | POA: Diagnosis not present

## 2020-09-19 ENCOUNTER — Telehealth (HOSPITAL_COMMUNITY): Payer: Self-pay | Admitting: *Deleted

## 2020-09-19 NOTE — Telephone Encounter (Signed)
Myoview scheduled for tomorrow, will follow up on the result

## 2020-09-19 NOTE — Telephone Encounter (Signed)
Left message on voicemail per DPR in reference to upcoming appointment scheduled on 09/20/20 at 8:00 with detailed instructions given per Myocardial Perfusion Study Information Sheet for the test. LM to arrive 15 minutes early, and that it is imperative to arrive on time for appointment to keep from having the test rescheduled. If you need to cancel or reschedule your appointment, please call the office within 24 hours of your appointment. Failure to do so may result in a cancellation of your appointment, and a $50 no show fee. Phone number given for call back for any questions.

## 2020-09-20 ENCOUNTER — Other Ambulatory Visit: Payer: Self-pay

## 2020-09-20 ENCOUNTER — Ambulatory Visit (INDEPENDENT_AMBULATORY_CARE_PROVIDER_SITE_OTHER): Payer: Medicare Other

## 2020-09-20 DIAGNOSIS — I480 Paroxysmal atrial fibrillation: Secondary | ICD-10-CM

## 2020-09-20 LAB — MYOCARDIAL PERFUSION IMAGING
Peak HR: 90 {beats}/min
Rest HR: 57 {beats}/min
SDS: 4
SRS: 0
SSS: 4
TID: 1.11

## 2020-09-20 MED ORDER — TECHNETIUM TC 99M TETROFOSMIN IV KIT
31.5000 | PACK | Freq: Once | INTRAVENOUS | Status: AC | PRN
  Administered 2020-09-20: 31.5 via INTRAVENOUS

## 2020-09-20 MED ORDER — REGADENOSON 0.4 MG/5ML IV SOLN
0.4000 mg | Freq: Once | INTRAVENOUS | Status: AC
  Administered 2020-09-20: 0.4 mg via INTRAVENOUS

## 2020-09-20 MED ORDER — TECHNETIUM TC 99M TETROFOSMIN IV KIT
9.7000 | PACK | Freq: Once | INTRAVENOUS | Status: AC | PRN
  Administered 2020-09-20: 9.7 via INTRAVENOUS

## 2020-09-21 NOTE — Telephone Encounter (Signed)
   Primary Cardiologist: [name], MD  Chart reviewed as part of pre-operative protocol coverage. Given past medical history and time since last visit, based on ACC/AHA guidelines, Jeremiah Beasley. would be at acceptable risk for the planned procedure without further cardiovascular testing. Nuclear stress test was low risk.   The patient was advised that if he develops new symptoms prior to surgery to contact our office to arrange for a follow-up visit, and he verbalized understanding.  I will route this recommendation to the requesting party via Epic fax function and remove from pre-op pool.  Please call with questions.  He may hold Xarelto for 3 days prior to the procedure and restart as soon as possible after the procedure.   [place], [place] 09/21/2020, 6:52 PM

## 2020-09-26 DIAGNOSIS — L820 Inflamed seborrheic keratosis: Secondary | ICD-10-CM | POA: Diagnosis not present

## 2020-09-27 DIAGNOSIS — E1140 Type 2 diabetes mellitus with diabetic neuropathy, unspecified: Secondary | ICD-10-CM | POA: Diagnosis not present

## 2020-09-28 ENCOUNTER — Telehealth: Payer: Self-pay | Admitting: Cardiology

## 2020-09-28 DIAGNOSIS — R600 Localized edema: Secondary | ICD-10-CM

## 2020-09-28 DIAGNOSIS — I10 Essential (primary) hypertension: Secondary | ICD-10-CM

## 2020-09-28 NOTE — Telephone Encounter (Signed)
Pt c/o swelling: STAT is pt has developed SOB within 24 hours  1) How much weight have you gained and in what time span? 2-3 lbs in 2 days  2) If swelling, where is the swelling located? Both feet   3) Are you currently taking a fluid pill? yes  4) Are you currently SOB? no  5) Do you have a log of your daily weights (if so, list)? 3 weeks ago 192 lbs, 196 lbs the other day  6) Have you gained 3 pounds in a day or 5 pounds in a week? 2-3 lbs in 2 days  7) Have you traveled recently? no   Patient states the past 2 days he has had swelling in his feet. He states it hurts to walk and his back also hurts. Please advise.

## 2020-09-29 MED ORDER — FUROSEMIDE 40 MG PO TABS: 40.0000 mg | ORAL_TABLET | Freq: Every day | ORAL | 3 refills | Status: DC

## 2020-09-29 NOTE — Telephone Encounter (Signed)
Spoke to the patient just now and let him know Dr. Wendy Poet recommendations. He verbalizes understanding and thanks me for the call back.

## 2020-09-29 NOTE — Telephone Encounter (Signed)
Please increase furosemide to 40 mg daily, he need to have a Chem-7 done next week as well as proBNP

## 2020-10-02 DIAGNOSIS — R600 Localized edema: Secondary | ICD-10-CM | POA: Diagnosis not present

## 2020-10-02 DIAGNOSIS — I10 Essential (primary) hypertension: Secondary | ICD-10-CM | POA: Diagnosis not present

## 2020-10-03 ENCOUNTER — Encounter: Payer: Self-pay | Admitting: Neurology

## 2020-10-03 ENCOUNTER — Telehealth: Payer: Self-pay | Admitting: Cardiology

## 2020-10-03 LAB — BASIC METABOLIC PANEL
BUN/Creatinine Ratio: 18 (ref 10–24)
BUN: 23 mg/dL (ref 8–27)
CO2: 23 mmol/L (ref 20–29)
Calcium: 9.3 mg/dL (ref 8.6–10.2)
Chloride: 101 mmol/L (ref 96–106)
Creatinine, Ser: 1.28 mg/dL — ABNORMAL HIGH (ref 0.76–1.27)
GFR calc Af Amer: 65 mL/min/{1.73_m2} (ref >59)
GFR calc non Af Amer: 56 mL/min/{1.73_m2} — ABNORMAL LOW (ref >59)
Glucose: 136 mg/dL — ABNORMAL HIGH (ref 65–99)
Potassium: 3.9 mmol/L (ref 3.5–5.2)
Sodium: 140 mmol/L (ref 134–144)

## 2020-10-03 LAB — PRO B NATRIURETIC PEPTIDE: NT-Pro BNP: 575 pg/mL — ABNORMAL HIGH (ref 0–376)

## 2020-10-03 NOTE — Telephone Encounter (Signed)
Patient is calling back for results.

## 2020-10-03 NOTE — Telephone Encounter (Signed)
Patient informed of results.  

## 2020-10-11 ENCOUNTER — Other Ambulatory Visit: Payer: Self-pay | Admitting: Orthopedic Surgery

## 2020-10-17 DIAGNOSIS — E1040 Type 1 diabetes mellitus with diabetic neuropathy, unspecified: Secondary | ICD-10-CM | POA: Diagnosis not present

## 2020-10-17 DIAGNOSIS — E1065 Type 1 diabetes mellitus with hyperglycemia: Secondary | ICD-10-CM | POA: Diagnosis not present

## 2020-10-17 DIAGNOSIS — E10649 Type 1 diabetes mellitus with hypoglycemia without coma: Secondary | ICD-10-CM | POA: Diagnosis not present

## 2020-10-18 DIAGNOSIS — E7800 Pure hypercholesterolemia, unspecified: Secondary | ICD-10-CM | POA: Diagnosis not present

## 2020-10-19 DIAGNOSIS — Z794 Long term (current) use of insulin: Secondary | ICD-10-CM | POA: Diagnosis not present

## 2020-10-19 DIAGNOSIS — H2513 Age-related nuclear cataract, bilateral: Secondary | ICD-10-CM | POA: Diagnosis not present

## 2020-10-19 DIAGNOSIS — E113293 Type 2 diabetes mellitus with mild nonproliferative diabetic retinopathy without macular edema, bilateral: Secondary | ICD-10-CM | POA: Diagnosis not present

## 2020-10-20 DIAGNOSIS — R110 Nausea: Secondary | ICD-10-CM | POA: Diagnosis not present

## 2020-10-20 DIAGNOSIS — E669 Obesity, unspecified: Secondary | ICD-10-CM | POA: Diagnosis present

## 2020-10-20 DIAGNOSIS — B379 Candidiasis, unspecified: Secondary | ICD-10-CM | POA: Diagnosis present

## 2020-10-20 DIAGNOSIS — E039 Hypothyroidism, unspecified: Secondary | ICD-10-CM | POA: Diagnosis present

## 2020-10-20 DIAGNOSIS — Z9641 Presence of insulin pump (external) (internal): Secondary | ICD-10-CM | POA: Diagnosis present

## 2020-10-20 DIAGNOSIS — U071 COVID-19: Secondary | ICD-10-CM | POA: Diagnosis not present

## 2020-10-20 DIAGNOSIS — Z794 Long term (current) use of insulin: Secondary | ICD-10-CM | POA: Diagnosis not present

## 2020-10-20 DIAGNOSIS — I2510 Atherosclerotic heart disease of native coronary artery without angina pectoris: Secondary | ICD-10-CM | POA: Diagnosis not present

## 2020-10-20 DIAGNOSIS — R0902 Hypoxemia: Secondary | ICD-10-CM | POA: Diagnosis not present

## 2020-10-20 DIAGNOSIS — M79662 Pain in left lower leg: Secondary | ICD-10-CM | POA: Diagnosis not present

## 2020-10-20 DIAGNOSIS — K819 Cholecystitis, unspecified: Secondary | ICD-10-CM | POA: Diagnosis not present

## 2020-10-20 DIAGNOSIS — R1110 Vomiting, unspecified: Secondary | ICD-10-CM | POA: Diagnosis not present

## 2020-10-20 DIAGNOSIS — K660 Peritoneal adhesions (postprocedural) (postinfection): Secondary | ICD-10-CM | POA: Diagnosis present

## 2020-10-20 DIAGNOSIS — K4090 Unilateral inguinal hernia, without obstruction or gangrene, not specified as recurrent: Secondary | ICD-10-CM | POA: Diagnosis not present

## 2020-10-20 DIAGNOSIS — Z7901 Long term (current) use of anticoagulants: Secondary | ICD-10-CM | POA: Diagnosis not present

## 2020-10-20 DIAGNOSIS — R509 Fever, unspecified: Secondary | ICD-10-CM | POA: Diagnosis not present

## 2020-10-20 DIAGNOSIS — I110 Hypertensive heart disease with heart failure: Secondary | ICD-10-CM | POA: Diagnosis present

## 2020-10-20 DIAGNOSIS — E878 Other disorders of electrolyte and fluid balance, not elsewhere classified: Secondary | ICD-10-CM | POA: Diagnosis not present

## 2020-10-20 DIAGNOSIS — R197 Diarrhea, unspecified: Secondary | ICD-10-CM | POA: Diagnosis not present

## 2020-10-20 DIAGNOSIS — Z79899 Other long term (current) drug therapy: Secondary | ICD-10-CM | POA: Diagnosis not present

## 2020-10-20 DIAGNOSIS — Z7982 Long term (current) use of aspirin: Secondary | ICD-10-CM | POA: Diagnosis not present

## 2020-10-20 DIAGNOSIS — M47816 Spondylosis without myelopathy or radiculopathy, lumbar region: Secondary | ICD-10-CM | POA: Diagnosis not present

## 2020-10-20 DIAGNOSIS — J9601 Acute respiratory failure with hypoxia: Secondary | ICD-10-CM | POA: Diagnosis not present

## 2020-10-20 DIAGNOSIS — E1165 Type 2 diabetes mellitus with hyperglycemia: Secondary | ICD-10-CM | POA: Diagnosis present

## 2020-10-20 DIAGNOSIS — E1169 Type 2 diabetes mellitus with other specified complication: Secondary | ICD-10-CM | POA: Diagnosis not present

## 2020-10-20 DIAGNOSIS — E119 Type 2 diabetes mellitus without complications: Secondary | ICD-10-CM | POA: Diagnosis not present

## 2020-10-20 DIAGNOSIS — Z9049 Acquired absence of other specified parts of digestive tract: Secondary | ICD-10-CM | POA: Diagnosis not present

## 2020-10-20 DIAGNOSIS — I5033 Acute on chronic diastolic (congestive) heart failure: Secondary | ICD-10-CM | POA: Diagnosis not present

## 2020-10-20 DIAGNOSIS — E1140 Type 2 diabetes mellitus with diabetic neuropathy, unspecified: Secondary | ICD-10-CM | POA: Diagnosis present

## 2020-10-20 DIAGNOSIS — N170 Acute kidney failure with tubular necrosis: Secondary | ICD-10-CM | POA: Diagnosis not present

## 2020-10-20 DIAGNOSIS — M25511 Pain in right shoulder: Secondary | ICD-10-CM | POA: Diagnosis not present

## 2020-10-20 DIAGNOSIS — J189 Pneumonia, unspecified organism: Secondary | ICD-10-CM | POA: Diagnosis present

## 2020-10-20 DIAGNOSIS — Z751 Person awaiting admission to adequate facility elsewhere: Secondary | ICD-10-CM | POA: Diagnosis not present

## 2020-10-20 DIAGNOSIS — M79661 Pain in right lower leg: Secondary | ICD-10-CM | POA: Diagnosis not present

## 2020-10-20 DIAGNOSIS — I10 Essential (primary) hypertension: Secondary | ICD-10-CM | POA: Diagnosis not present

## 2020-10-20 DIAGNOSIS — I4811 Longstanding persistent atrial fibrillation: Secondary | ICD-10-CM | POA: Diagnosis present

## 2020-10-20 DIAGNOSIS — K8000 Calculus of gallbladder with acute cholecystitis without obstruction: Secondary | ICD-10-CM | POA: Diagnosis not present

## 2020-10-20 DIAGNOSIS — Z0389 Encounter for observation for other suspected diseases and conditions ruled out: Secondary | ICD-10-CM | POA: Diagnosis not present

## 2020-10-20 DIAGNOSIS — I5032 Chronic diastolic (congestive) heart failure: Secondary | ICD-10-CM | POA: Diagnosis not present

## 2020-10-20 DIAGNOSIS — K8020 Calculus of gallbladder without cholecystitis without obstruction: Secondary | ICD-10-CM | POA: Diagnosis not present

## 2020-10-20 DIAGNOSIS — R000 Tachycardia, unspecified: Secondary | ICD-10-CM | POA: Diagnosis present

## 2020-10-20 DIAGNOSIS — K8012 Calculus of gallbladder with acute and chronic cholecystitis without obstruction: Secondary | ICD-10-CM | POA: Diagnosis not present

## 2020-10-20 DIAGNOSIS — R109 Unspecified abdominal pain: Secondary | ICD-10-CM | POA: Diagnosis not present

## 2020-10-20 DIAGNOSIS — M7989 Other specified soft tissue disorders: Secondary | ICD-10-CM | POA: Diagnosis not present

## 2020-10-20 DIAGNOSIS — J9690 Respiratory failure, unspecified, unspecified whether with hypoxia or hypercapnia: Secondary | ICD-10-CM | POA: Diagnosis not present

## 2020-10-20 DIAGNOSIS — I4891 Unspecified atrial fibrillation: Secondary | ICD-10-CM | POA: Diagnosis not present

## 2020-10-20 DIAGNOSIS — M47814 Spondylosis without myelopathy or radiculopathy, thoracic region: Secondary | ICD-10-CM | POA: Diagnosis not present

## 2020-10-20 DIAGNOSIS — J9811 Atelectasis: Secondary | ICD-10-CM | POA: Diagnosis not present

## 2020-10-20 DIAGNOSIS — R112 Nausea with vomiting, unspecified: Secondary | ICD-10-CM | POA: Diagnosis not present

## 2020-10-20 HISTORY — DX: Pneumonia, unspecified organism: J18.9

## 2020-10-20 HISTORY — PX: OTHER SURGICAL HISTORY: SHX169

## 2020-10-21 DIAGNOSIS — J9811 Atelectasis: Secondary | ICD-10-CM | POA: Diagnosis not present

## 2020-10-21 DIAGNOSIS — K8020 Calculus of gallbladder without cholecystitis without obstruction: Secondary | ICD-10-CM | POA: Diagnosis not present

## 2020-10-21 DIAGNOSIS — J9690 Respiratory failure, unspecified, unspecified whether with hypoxia or hypercapnia: Secondary | ICD-10-CM | POA: Diagnosis not present

## 2020-10-22 DIAGNOSIS — K8000 Calculus of gallbladder with acute cholecystitis without obstruction: Secondary | ICD-10-CM | POA: Diagnosis not present

## 2020-10-23 DIAGNOSIS — I4891 Unspecified atrial fibrillation: Secondary | ICD-10-CM | POA: Diagnosis not present

## 2020-10-23 DIAGNOSIS — K8000 Calculus of gallbladder with acute cholecystitis without obstruction: Secondary | ICD-10-CM | POA: Diagnosis not present

## 2020-10-23 DIAGNOSIS — E119 Type 2 diabetes mellitus without complications: Secondary | ICD-10-CM | POA: Diagnosis not present

## 2020-10-23 DIAGNOSIS — I10 Essential (primary) hypertension: Secondary | ICD-10-CM | POA: Diagnosis not present

## 2020-10-23 DIAGNOSIS — K8012 Calculus of gallbladder with acute and chronic cholecystitis without obstruction: Secondary | ICD-10-CM | POA: Diagnosis not present

## 2020-10-23 DIAGNOSIS — J9690 Respiratory failure, unspecified, unspecified whether with hypoxia or hypercapnia: Secondary | ICD-10-CM | POA: Diagnosis not present

## 2020-10-23 DIAGNOSIS — J9811 Atelectasis: Secondary | ICD-10-CM | POA: Diagnosis not present

## 2020-10-24 DIAGNOSIS — R0902 Hypoxemia: Secondary | ICD-10-CM | POA: Diagnosis not present

## 2020-10-25 DIAGNOSIS — I4891 Unspecified atrial fibrillation: Secondary | ICD-10-CM | POA: Diagnosis not present

## 2020-10-26 DIAGNOSIS — I10 Essential (primary) hypertension: Secondary | ICD-10-CM

## 2020-10-26 DIAGNOSIS — I5032 Chronic diastolic (congestive) heart failure: Secondary | ICD-10-CM

## 2020-10-26 DIAGNOSIS — E878 Other disorders of electrolyte and fluid balance, not elsewhere classified: Secondary | ICD-10-CM

## 2020-10-26 DIAGNOSIS — E1169 Type 2 diabetes mellitus with other specified complication: Secondary | ICD-10-CM

## 2020-10-26 DIAGNOSIS — I4891 Unspecified atrial fibrillation: Secondary | ICD-10-CM

## 2020-10-27 ENCOUNTER — Other Ambulatory Visit: Payer: Self-pay

## 2020-10-27 DIAGNOSIS — I10 Essential (primary) hypertension: Secondary | ICD-10-CM | POA: Diagnosis not present

## 2020-10-27 DIAGNOSIS — R202 Paresthesia of skin: Secondary | ICD-10-CM

## 2020-10-27 DIAGNOSIS — E1169 Type 2 diabetes mellitus with other specified complication: Secondary | ICD-10-CM | POA: Diagnosis not present

## 2020-10-27 DIAGNOSIS — I5032 Chronic diastolic (congestive) heart failure: Secondary | ICD-10-CM | POA: Diagnosis not present

## 2020-10-27 DIAGNOSIS — I4891 Unspecified atrial fibrillation: Secondary | ICD-10-CM | POA: Diagnosis not present

## 2020-10-28 DIAGNOSIS — Z9049 Acquired absence of other specified parts of digestive tract: Secondary | ICD-10-CM

## 2020-10-28 DIAGNOSIS — I4891 Unspecified atrial fibrillation: Secondary | ICD-10-CM

## 2020-10-28 DIAGNOSIS — E1169 Type 2 diabetes mellitus with other specified complication: Secondary | ICD-10-CM

## 2020-10-29 DIAGNOSIS — Z9049 Acquired absence of other specified parts of digestive tract: Secondary | ICD-10-CM | POA: Diagnosis not present

## 2020-10-29 DIAGNOSIS — I4891 Unspecified atrial fibrillation: Secondary | ICD-10-CM | POA: Diagnosis not present

## 2020-10-29 DIAGNOSIS — E1169 Type 2 diabetes mellitus with other specified complication: Secondary | ICD-10-CM | POA: Diagnosis not present

## 2020-10-30 DIAGNOSIS — Z9049 Acquired absence of other specified parts of digestive tract: Secondary | ICD-10-CM | POA: Diagnosis not present

## 2020-10-30 DIAGNOSIS — I10 Essential (primary) hypertension: Secondary | ICD-10-CM | POA: Insufficient documentation

## 2020-10-30 DIAGNOSIS — E1169 Type 2 diabetes mellitus with other specified complication: Secondary | ICD-10-CM | POA: Diagnosis not present

## 2020-10-30 DIAGNOSIS — I4891 Unspecified atrial fibrillation: Secondary | ICD-10-CM | POA: Insufficient documentation

## 2020-10-30 DIAGNOSIS — E119 Type 2 diabetes mellitus without complications: Secondary | ICD-10-CM | POA: Insufficient documentation

## 2020-10-31 ENCOUNTER — Ambulatory Visit: Payer: Medicare Other | Admitting: Cardiology

## 2020-10-31 DIAGNOSIS — E1169 Type 2 diabetes mellitus with other specified complication: Secondary | ICD-10-CM | POA: Diagnosis not present

## 2020-10-31 DIAGNOSIS — I4891 Unspecified atrial fibrillation: Secondary | ICD-10-CM | POA: Diagnosis not present

## 2020-10-31 DIAGNOSIS — Z9049 Acquired absence of other specified parts of digestive tract: Secondary | ICD-10-CM | POA: Diagnosis not present

## 2020-11-01 ENCOUNTER — Telehealth: Payer: Self-pay | Admitting: Cardiology

## 2020-11-01 NOTE — Telephone Encounter (Signed)
[name] is calling to schedule a hospital f/u for Jeremiah Beasley, but they are not wanting to come in today due to it being so soon. The next available at either office is not until January. She is requesting a callback to have him worked in some time next week. Please advise.

## 2020-11-02 DIAGNOSIS — E1142 Type 2 diabetes mellitus with diabetic polyneuropathy: Secondary | ICD-10-CM | POA: Diagnosis not present

## 2020-11-02 DIAGNOSIS — L89152 Pressure ulcer of sacral region, stage 2: Secondary | ICD-10-CM | POA: Diagnosis not present

## 2020-11-02 DIAGNOSIS — Z7901 Long term (current) use of anticoagulants: Secondary | ICD-10-CM | POA: Diagnosis not present

## 2020-11-02 DIAGNOSIS — I482 Chronic atrial fibrillation, unspecified: Secondary | ICD-10-CM | POA: Diagnosis not present

## 2020-11-02 DIAGNOSIS — Z48815 Encounter for surgical aftercare following surgery on the digestive system: Secondary | ICD-10-CM | POA: Diagnosis not present

## 2020-11-02 DIAGNOSIS — E669 Obesity, unspecified: Secondary | ICD-10-CM | POA: Diagnosis not present

## 2020-11-02 DIAGNOSIS — J9601 Acute respiratory failure with hypoxia: Secondary | ICD-10-CM | POA: Diagnosis not present

## 2020-11-02 DIAGNOSIS — Z7982 Long term (current) use of aspirin: Secondary | ICD-10-CM | POA: Diagnosis not present

## 2020-11-02 DIAGNOSIS — J189 Pneumonia, unspecified organism: Secondary | ICD-10-CM | POA: Diagnosis not present

## 2020-11-02 DIAGNOSIS — I5032 Chronic diastolic (congestive) heart failure: Secondary | ICD-10-CM | POA: Diagnosis not present

## 2020-11-02 DIAGNOSIS — Z9181 History of falling: Secondary | ICD-10-CM | POA: Diagnosis not present

## 2020-11-02 DIAGNOSIS — Z6825 Body mass index (BMI) 25.0-25.9, adult: Secondary | ICD-10-CM | POA: Diagnosis not present

## 2020-11-02 DIAGNOSIS — I110 Hypertensive heart disease with heart failure: Secondary | ICD-10-CM | POA: Diagnosis not present

## 2020-11-03 ENCOUNTER — Other Ambulatory Visit: Payer: Self-pay

## 2020-11-03 ENCOUNTER — Encounter: Payer: Self-pay | Admitting: Cardiology

## 2020-11-03 ENCOUNTER — Telehealth: Payer: Self-pay | Admitting: Cardiology

## 2020-11-03 ENCOUNTER — Ambulatory Visit (INDEPENDENT_AMBULATORY_CARE_PROVIDER_SITE_OTHER): Payer: Medicare Other | Admitting: Cardiology

## 2020-11-03 VITALS — BP 130/78 | HR 78 | Ht 69.0 in | Wt 199.0 lb

## 2020-11-03 DIAGNOSIS — E119 Type 2 diabetes mellitus without complications: Secondary | ICD-10-CM

## 2020-11-03 DIAGNOSIS — R040 Epistaxis: Secondary | ICD-10-CM

## 2020-11-03 DIAGNOSIS — E782 Mixed hyperlipidemia: Secondary | ICD-10-CM | POA: Diagnosis not present

## 2020-11-03 DIAGNOSIS — I10 Essential (primary) hypertension: Secondary | ICD-10-CM

## 2020-11-03 DIAGNOSIS — I480 Paroxysmal atrial fibrillation: Secondary | ICD-10-CM

## 2020-11-03 MED ORDER — AMIODARONE HCL 200 MG PO TABS: 200.0000 mg | ORAL_TABLET | Freq: Every day | ORAL | 1 refills | Status: DC

## 2020-11-03 NOTE — Patient Instructions (Signed)
Medication Instructions:  Your physician has recommended you make the following change in your medication:   DECREASE: Amiodarone 200 mg daily   *If you need a refill on your cardiac medications before your next appointment, please call your pharmacy*   Lab Work: Your physician recommends that you return for lab work today: bmp   If you have labs (blood work) drawn today and your tests are completely normal, you will receive your results only by:  [place] (if you have MyChart) OR  A paper copy in the mail If you have any lab test that is abnormal or we need to change your treatment, we will call you to review the results.   Testing/Procedures: None.   Follow-Up: At [facility], you and your health needs are our priority.  As part of our continuing mission to provide you with exceptional heart care, we have created designated Provider Care Teams.  These Care Teams include your primary Cardiologist (physician) and Advanced Practice Providers (APPs -  Physician Assistants and Nurse Practitioners) who all work together to provide you with the care you need, when you need it.  We recommend signing up for the patient portal called "MyChart".  Sign up information is provided on this After Visit Summary.  MyChart is used to connect with patients for Virtual Visits (Telemedicine).  Patients are able to view lab/test results, encounter notes, upcoming appointments, etc.  Non-urgent messages can be sent to your provider as well.   To learn more about what you can do with MyChart, go to [email or web address].    Your next appointment:   4 week(s)  The format for your next appointment:   In Person  Provider:   [name], MD   Other Instructions

## 2020-11-03 NOTE — Telephone Encounter (Signed)
Wife called and said the patient has had two nosebleeds. The first one happened yesterday and she thought it was a fluke, so they didn't mention it to Dr. Agustin Cree. It happened around the same time of day today.  There were a lot of medications that were changed when the patient was in the hospital and the wife is not sure which medication could be causing it.

## 2020-11-03 NOTE — Telephone Encounter (Signed)
I agree with the assessment for going to the emergency department if this get worse., please have the patient continue with the Xarelto for now.  He will need to be evaluated by ENT as well.  Please send a referral for ENT for the patient.

## 2020-11-03 NOTE — Telephone Encounter (Signed)
Called and spoke to wife per dpr. She reports patient was seen in office today with Dr. Agustin Cree. During the visit he did not share with Dr. Agustin Cree that he had a 5-10 minute nose bleed yesterday, after leaving office he had another one. No clots. He takes xarelto 20 mg daily.   He has had some med changes since coming out of hospital. Decrease amiodarone, start digoxin, stopped amlodipine stopped lasix. Wife wants to know if these changes could contribute to the nosebleed, or if him wearing oxygen in hospital would cause nosebleed. Will consult with Dr. Harriet Masson.   In the mean time I informed patient wife if bleeds continue, get heavier or they start seeing clots to go to the emergency room.

## 2020-11-03 NOTE — Telephone Encounter (Signed)
Patient seen today

## 2020-11-03 NOTE — Progress Notes (Signed)
Cardiology Office Note:    Date:  11/03/2020   ID:  Jeremiah Sheriff., DOB 08-05-1946, MRN [id number]  PCP:  [name], MD  Cardiologist:  [name], MD    Referring MD: [name], [name], MD   No chief complaint on file. I was in the hospital  History of Present Illness:    Jeremiah Lorusso. is a 74 y.o. male with past medical history significant for paroxysmal atrial fibrillation initially successfully suppressed with amiodarone but because of bradycardia amiodarone has been discontinued.  He also history of diabetes hypertension hyperlipidemia.  He does have history of swelling of the tongue secondary to ACE inhibitor which was withdrawn.  He comes today to my office for follow-up.  Recently he did go to the hospital because of gallbladder problem he required cholecystectomy.  Hospital course was complicated by episode of atrial fibrillation after that and decompensated congestive heart failure. Overall he is doing better.  He said still little bit tired weak does have some swelling of lower extremities especially given time, no palpitations.  Past Medical History:  Diagnosis Date  . Atrial fibrillation ([place])   . Chronic pain of right knee 04/11/2020  . Diabetes mellitus without complication ([place])   . Essential hypertension 05/15/2015  . Hyperlipidemia   . Hypertension   . Paroxysmal atrial fibrillation ([place]) 05/15/2015  . Pure hypercholesterolemia 05/15/2015  . S/P right knee arthroscopy 04/25/2020  . Type 2 diabetes mellitus without complication ([place]) 05/11/7627    Past Surgical History:  Procedure Laterality Date  . NO PAST SURGERIES      Current Medications: No outpatient medications have been marked as taking for the 11/03/20 encounter (Appointment) with Park Liter, MD.     Allergies:   Patient has no known allergies.   Social History   Socioeconomic History  . Marital status: Married    Spouse name: Not on file  . Number of children: Not on file   . Years of education: Not on file  . Highest education level: Not on file  Occupational History  . Not on file  Tobacco Use  . Smoking status: Never Smoker  . Smokeless tobacco: Current User  Vaping Use  . Vaping Use: Never used  Substance and Sexual Activity  . Alcohol use: No  . Drug use: No  . Sexual activity: Not on file  Other Topics Concern  . Not on file  Social History Narrative  . Not on file   Social Determinants of Health   Financial Resource Strain: Not on file  Food Insecurity: Not on file  Transportation Needs: Not on file  Physical Activity: Not on file  Stress: Not on file  Social Connections: Not on file     Family History: The patient's family history includes Heart attack in his father; Hyperlipidemia in his father; Stroke in his father. ROS:   Please see the history of present illness.    All 14 point review of systems negative except as described per history of present illness  EKGs/Labs/Other Studies Reviewed:      Recent Labs: 10/02/2020: BUN 23; Creatinine, Ser 1.28; NT-Pro BNP 575; Potassium 3.9; Sodium 140  Recent Lipid Panel    Component Value Date/Time   CHOL 134 05/31/2020 1324   TRIG 82 05/31/2020 1324   HDL 32 (L) 05/31/2020 1324   CHOLHDL 4.2 05/31/2020 1324   LDLCALC 86 05/31/2020 1324    Physical Exam:    VS:  There were no vitals taken for  this visit.    Wt Readings from Last 3 Encounters:  09/20/20 200 lb (90.7 kg)  05/31/20 200 lb (90.7 kg)  03/23/20 198 lb (89.8 kg)     GEN:  Well nourished, well developed in no acute distress HEENT: Normal NECK: No JVD; No carotid bruits LYMPHATICS: No lymphadenopathy CARDIAC: Irregularly irregular, no murmurs, no rubs, no gallops RESPIRATORY:  Clear to auscultation without rales, wheezing or rhonchi  ABDOMEN: Soft, non-tender, non-distended MUSCULOSKELETAL: 1+ swelling lower extremity; No deformity  SKIN: Warm and dry LOWER EXTREMITIES: no swelling NEUROLOGIC:  Alert and  oriented x 3 PSYCHIATRIC:  Normal affect   ASSESSMENT:    1. Paroxysmal atrial fibrillation (HCC)   2. Essential hypertension   3. Type 2 diabetes mellitus without complication, without long-term current use of insulin ([place])   4. Mixed hyperlipidemia    PLAN:    In order of problems listed above:  1. Persistent atrial fibrillation, his EKG today show atrial fibrillation with controlled ventricular rate, he is anticoagulated with Xarelto which I will continue.  He is being on amiodarone 400 mg daily however he does have history of bradycardia on amiodarone while in sinus rhythm, therefore, I will reduce his amiodarone to 200 mg daily.  I will continue anticoagulation with antiarrhythmic therapy and bring him back to my office 3 to 4 weeks see what does if he still in atrial fibrillation then will make arrangements for cardioversion. 2. Essential hypertension his wife check his blood pressure on the regular basis it is good.  Recently dose of hydralazine has been withdrawn because of episode of low blood pressure.  We will continue checking it. 3. Type 2 diabetes followed by internal medicine team. 4. Mixed dyslipidemia: He is on statin which will continue 5. Swelling of lower extremities he is echocardiogram showed preserved left ventricle ejection fraction.  I think the reason for this mild decompensation is probably atrial fibrillation.  I will check Chem-7 proBNP today if that is fine will put him on diuretic.  While in the hospital he did have some kidney dysfunction with highest creatinine of 2.1.  The time of presentation his creatinine was 1.2 when he was leaving the hospital 1.3   Medication Adjustments/Labs and Tests Ordered: Current medicines are reviewed at length with the patient today.  Concerns regarding medicines are outlined above.  No orders of the defined types were placed in this encounter.  Medication changes: No orders of the defined types were placed in this  encounter.   Signed, Park Liter, MD, [facility] 11/03/2020 11:26 AM    [place]

## 2020-11-03 NOTE — Telephone Encounter (Signed)
Called patient informed him of recommendation and sent in referral.

## 2020-11-03 NOTE — Addendum Note (Signed)
Addended by: Senaida Ores on: 11/03/2020 04:56 PM   Modules accepted: Orders

## 2020-11-06 ENCOUNTER — Telehealth: Payer: Self-pay | Admitting: Cardiology

## 2020-11-06 DIAGNOSIS — Z48815 Encounter for surgical aftercare following surgery on the digestive system: Secondary | ICD-10-CM | POA: Diagnosis not present

## 2020-11-06 DIAGNOSIS — L89152 Pressure ulcer of sacral region, stage 2: Secondary | ICD-10-CM | POA: Diagnosis not present

## 2020-11-06 DIAGNOSIS — I110 Hypertensive heart disease with heart failure: Secondary | ICD-10-CM | POA: Diagnosis not present

## 2020-11-06 DIAGNOSIS — I482 Chronic atrial fibrillation, unspecified: Secondary | ICD-10-CM | POA: Diagnosis not present

## 2020-11-06 DIAGNOSIS — J189 Pneumonia, unspecified organism: Secondary | ICD-10-CM | POA: Diagnosis not present

## 2020-11-06 DIAGNOSIS — E1142 Type 2 diabetes mellitus with diabetic polyneuropathy: Secondary | ICD-10-CM | POA: Diagnosis not present

## 2020-11-06 LAB — BASIC METABOLIC PANEL
BUN/Creatinine Ratio: 15 (ref 10–24)
BUN: 17 mg/dL (ref 8–27)
CO2: 19 mmol/L — ABNORMAL LOW (ref 20–29)
Calcium: 8.5 mg/dL — ABNORMAL LOW (ref 8.6–10.2)
Chloride: 102 mmol/L (ref 96–106)
Creatinine, Ser: 1.16 mg/dL (ref 0.76–1.27)
GFR calc Af Amer: 72 mL/min/{1.73_m2} (ref >59)
GFR calc non Af Amer: 63 mL/min/{1.73_m2} (ref >59)
Glucose: 207 mg/dL — ABNORMAL HIGH (ref 65–99)
Potassium: 5.6 mmol/L — ABNORMAL HIGH (ref 3.5–5.2)
Sodium: 139 mmol/L (ref 134–144)

## 2020-11-06 NOTE — Telephone Encounter (Signed)
Home Health Verbal Orders - Caller/Agency:  Tiffany from [facility] Number: [phone]  She would like to know the parameter  for pt BP and hr so she can mange his amiodarone (PACERONE) 200 MG tablet [[id number]] .

## 2020-11-06 NOTE — Telephone Encounter (Signed)
Routing to MD.

## 2020-11-07 NOTE — Telephone Encounter (Signed)
There is no parameter for blood pressure for amiodarone, amiodarone oral form does not alter her blood pressure.

## 2020-11-07 NOTE — Telephone Encounter (Signed)
Storey notified and did not have any further question. She did states the patient is doing well with the recent changes and if any changes she will call.

## 2020-11-08 ENCOUNTER — Other Ambulatory Visit: Payer: Self-pay

## 2020-11-08 ENCOUNTER — Telehealth: Payer: Self-pay | Admitting: Emergency Medicine

## 2020-11-08 ENCOUNTER — Ambulatory Visit (INDEPENDENT_AMBULATORY_CARE_PROVIDER_SITE_OTHER): Payer: Medicare Other | Admitting: Neurology

## 2020-11-08 ENCOUNTER — Encounter: Payer: Self-pay | Admitting: Neurology

## 2020-11-08 ENCOUNTER — Other Ambulatory Visit (INDEPENDENT_AMBULATORY_CARE_PROVIDER_SITE_OTHER): Payer: Medicare Other

## 2020-11-08 VITALS — BP 128/72 | HR 118 | Ht 69.0 in | Wt 195.0 lb

## 2020-11-08 DIAGNOSIS — E1342 Other specified diabetes mellitus with diabetic polyneuropathy: Secondary | ICD-10-CM

## 2020-11-08 DIAGNOSIS — R202 Paresthesia of skin: Secondary | ICD-10-CM

## 2020-11-08 DIAGNOSIS — E875 Hyperkalemia: Secondary | ICD-10-CM

## 2020-11-08 LAB — TSH: TSH: 2.95 u[IU]/mL (ref 0.35–4.50)

## 2020-11-08 LAB — B12 AND FOLATE PANEL
Folate: 5.2 ng/mL — ABNORMAL LOW (ref >5.9)
Vitamin B-12: 233 pg/mL (ref 211–911)

## 2020-11-08 MED ORDER — GABAPENTIN 300 MG PO CAPS: 300.0000 mg | ORAL_CAPSULE | Freq: Three times a day (TID) | ORAL | 3 refills | Status: DC

## 2020-11-08 NOTE — Telephone Encounter (Signed)
Called patient back. After discussing with Dr. Harriet Masson patient was informed to have labs drawn today. Patient ware and will come today for labs.

## 2020-11-08 NOTE — Telephone Encounter (Signed)
-----   Message from Park Liter, MD sent at 11/07/2020  9:11 AM EST ----- It is interesting his potassium is elevated 5.6, please call him and ask him if he is taking any potassium ACE inhibitor or ARB or anything that can make his potassium high make sure he does not take Aldactone.  If none of this is happening please recheck his potassium today

## 2020-11-08 NOTE — Progress Notes (Signed)
[facility] Clinic Note - Initial Visit   Date: 11/08/20  Jeremiah Beasley. MRN: [id number] DOB: 08/18/1946   Dear Dr. [name]:  Thank you for your kind referral of Jeremiah Beasley. for consultation of bilateral feet pain. Although his history is well known to you, please allow [place] to reiterate it for the purpose of our medical record. The patient was accompanied to the clinic by wife who also provides collateral information.     History of Present Illness: Jeremiah Beasley. is a 74 y.o. male with atrial fibrillation, hypertension, hyperlipidemia, hypothyroidism, and diabetes mellitus on insulin pump presenting for evaluation of neuropathy.  He has been diabetic for the past 20 years and developed neuropathy in the feet about 5-10 years ago.  His neuropathy consistent of numbness and tingling involving the toes and soles of the feet, it does not extend above the ankles.  He denies weakness.  He has some imbalance and suffered three falls this year.  He walks unassisted.  The tingling is painful and he takes gabapentin 200mg  TID, previously on 300mg  TID but recently reduced due to renal insuffiencey while hospitalized.  His most recent labs from 12/17 show normal renal function. He was admitted for acute cholecystitis complicated by atrial fibrillation and deconditioning. He is getting home PT now. He denies numbness/tingling in the hands.  No history of alcohol abuse.   Out-side paper records, electronic medical record, and images have been reviewed where available and summarized as:  Lab Results  Component Value Date   CREATININE 1.16 11/03/2020   BUN 17 11/03/2020   NA 139 11/03/2020   K 5.6 (H) 11/03/2020   CL 102 11/03/2020   CO2 19 (L) 11/03/2020     Past Medical History:  Diagnosis Date   Atrial fibrillation (HCC)    Chronic pain of right knee 04/11/2020   Diabetes mellitus without complication ([place])    Essential hypertension 05/15/2015    Hyperlipidemia    Hypertension    Paroxysmal atrial fibrillation ([place]) 05/15/2015   Pneumonia 10/20/2020   Pure hypercholesterolemia 05/15/2015   S/P right knee arthroscopy 04/25/2020   Type 2 diabetes mellitus without complication ([place]) 7/82/9562    Past Surgical History:  Procedure Laterality Date   gall bladder removed   10/20/2020   NO PAST SURGERIES       Medications:  Outpatient Encounter Medications as of 11/08/2020  Medication Sig   amiodarone (PACERONE) 200 MG tablet Take 1 tablet (200 mg total) by mouth daily.   aspirin EC 81 MG tablet Take 81 mg by mouth daily.   digoxin (LANOXIN) 0.125 MG tablet Take 0.125 mg by mouth every other day.   ezetimibe (ZETIA) 10 MG tablet Take 1 tablet (10 mg total) by mouth daily.   gabapentin (NEURONTIN) 300 MG capsule Take 1 capsule (300 mg total) by mouth 3 (three) times daily.   guaiFENesin (ROBITUSSIN) 100 MG/5ML liquid Take 10 mLs by mouth 4 (four) times daily - after meals and at bedtime.   hydrALAZINE (APRESOLINE) 50 MG tablet Take 50 mg by mouth 3 (three) times daily.   levothyroxine (SYNTHROID, LEVOTHROID) 75 MCG tablet Take 1 tablet by mouth daily.   NOVOLOG 100 UNIT/ML injection    Probiotic Product (FLORA-Q PO) Take by mouth. 1 tab oral twice daily at lunch and supper   rivaroxaban (XARELTO) 20 MG TABS tablet Take 1 tablet by mouth daily.   simvastatin (ZOCOR) 80 MG tablet Take 80 mg by mouth. 80 mg orally  every morning   ondansetron (ZOFRAN-ODT) 4 MG disintegrating tablet  (Patient not taking: Reported on 11/08/2020)   [DISCONTINUED] gabapentin (NEURONTIN) 300 MG capsule Take 200 mg by mouth as needed.   No facility-administered encounter medications on file as of 11/08/2020.    Allergies:  Allergies  Allergen Reactions   Quinapril     Family History: Family History  Problem Relation Age of Onset   Heart attack Father    Hyperlipidemia Father    Stroke Father     Social History: Social  History   Tobacco Use   Smoking status: Never Smoker   Smokeless tobacco: Current Counsellor Use: Never used  Substance Use Topics   Alcohol use: No   Drug use: No   Social History   Social History Narrative   Not on file    Vital Signs:  BP 128/72    Pulse (!) 118    Ht 5\' 9"  (1.753 m)    Wt 195 lb (88.5 kg)    SpO2 98%    BMI 28.80 kg/m   Neurological Exam: MENTAL STATUS including orientation to time, place, person, recent and remote memory, attention span and concentration, language, and fund of knowledge is normal.  Speech is not dysarthric.  CRANIAL NERVES: II:  No visual field defects.  III-IV-VI: Pupils equal round and reactive to light.  Normal conjugate, extra-ocular eye movements in all directions of gaze.  No nystagmus.  No ptosis.   V:  Normal facial sensation.    VII:  Normal facial symmetry and movements.   VIII:  Normal hearing and vestibular function.   IX-X:  Normal palatal movement.   XI:  Normal shoulder shrug and head rotation.   XII:  Normal tongue strength and range of motion, no deviation or fasciculation.  MOTOR:  No atrophy, fasciculations or abnormal movements.  No pronator drift.   Upper Extremity:  Right  Left  Deltoid  5/5   5/5   Biceps  5/5   5/5   Triceps  5/5   5/5   Infraspinatus 5/5  5/5  Medial pectoralis 5/5  5/5  Wrist extensors  5/5   5/5   Wrist flexors  5/5   5/5   Finger extensors  5/5   5/5   Finger flexors  5/5   5/5   Dorsal interossei  5/5   5/5   Abductor pollicis  5/5   5/5   Tone (Ashworth scale)  0  0   Lower Extremity:  Right  Left  Hip flexors  5/5   5/5   Hip extensors  5/5   5/5   Adductor 5/5  5/5  Abductor 5/5  5/5  Knee flexors  5/5   5/5   Knee extensors  5/5   5/5   Dorsiflexors  5/5   5/5   Plantarflexors  5/5   5/5   Toe extensors  5/5   5/5   Toe flexors  5-/5   5-/5   Tone (Ashworth scale)  0  0   MSRs:  Right        Left                  brachioradialis 2+  2+  biceps  2+  2+  triceps 2+  2+  patellar 1+  1+  ankle jerk tr  tr  Hoffman no  no  plantar response down  down   SENSORY:  Vibration reduced to  50% distal to ankles bilaterally, pin prick reduced over the feet.  Temperature is mildly diminished in the feet.   Romberg's sign shows mild sway.   COORDINATION/GAIT: Normal finger-to- nose-finger.  Intact rapid alternating movements bilaterally.   Gait narrow based and stable. Tandem and stressed gait intact.    IMPRESSION: Diabetic polyneuropathy affecting the feet, longstanding. His neurological examination shows a distal predominant large fiber peripheral neuropathy. I had extensive discussion with the patient regarding the pathogenesis, etiology, management, and natural course of neuropathy. Neuropathy tends to be slowly progressive, even in the best controlled diabetics.  Although diabetes is the most likely etiology, I would like to test for other treatable causes of neuropathy.  Management is symptomatic.  Patient informed there is nothing to reverse neuropathy and medications such as gabapentin are only for pain relief.   PLAN/RECOMMENDATIONS:  1. Check vitamin B12, folate, TSH, SPEP with IFE, copper 2. Out-patient PT for balance training 3. Increase gabapentin to 300mg  three times daily.  Call with update in 6-8 weeks, can titrate further as needed 4. Patient educated on daily foot inspection, fall prevention, and safety precautions around the home.  Return to clinic in 6 months.   Thank you for allowing me to participate in patient's care.  If I can answer any additional questions, I would be pleased to do so.    Sincerely,    Jazsmin Couse K. Posey Pronto, DO

## 2020-11-08 NOTE — Patient Instructions (Addendum)
Check labs  Out-patient PT for balance training  Increase gabapentin to 300mg  three times daily.  Call with update in 6 - 8 weeks, we can increase it further, if needed  Check feet daily  Install grab bars in shower  Return to clinic in 6 months

## 2020-11-09 DIAGNOSIS — I110 Hypertensive heart disease with heart failure: Secondary | ICD-10-CM | POA: Diagnosis not present

## 2020-11-09 DIAGNOSIS — J189 Pneumonia, unspecified organism: Secondary | ICD-10-CM | POA: Diagnosis not present

## 2020-11-09 DIAGNOSIS — I482 Chronic atrial fibrillation, unspecified: Secondary | ICD-10-CM | POA: Diagnosis not present

## 2020-11-09 DIAGNOSIS — L89152 Pressure ulcer of sacral region, stage 2: Secondary | ICD-10-CM | POA: Diagnosis not present

## 2020-11-09 DIAGNOSIS — E1142 Type 2 diabetes mellitus with diabetic polyneuropathy: Secondary | ICD-10-CM | POA: Diagnosis not present

## 2020-11-09 DIAGNOSIS — Z48815 Encounter for surgical aftercare following surgery on the digestive system: Secondary | ICD-10-CM | POA: Diagnosis not present

## 2020-11-09 LAB — BASIC METABOLIC PANEL
BUN/Creatinine Ratio: 13 (ref 10–24)
BUN: 17 mg/dL (ref 8–27)
CO2: 25 mmol/L (ref 20–29)
Calcium: 8.7 mg/dL (ref 8.6–10.2)
Chloride: 105 mmol/L (ref 96–106)
Creatinine, Ser: 1.34 mg/dL — ABNORMAL HIGH (ref 0.76–1.27)
GFR calc Af Amer: 61 mL/min/{1.73_m2} (ref >59)
GFR calc non Af Amer: 53 mL/min/{1.73_m2} — ABNORMAL LOW (ref >59)
Glucose: 165 mg/dL — ABNORMAL HIGH (ref 65–99)
Potassium: 4.6 mmol/L (ref 3.5–5.2)
Sodium: 141 mmol/L (ref 134–144)

## 2020-11-13 ENCOUNTER — Telehealth: Payer: Self-pay

## 2020-11-13 ENCOUNTER — Encounter (HOSPITAL_COMMUNITY): Admission: RE | Admit: 2020-11-13 | Payer: Medicare Other | Source: Ambulatory Visit

## 2020-11-13 DIAGNOSIS — R202 Paresthesia of skin: Secondary | ICD-10-CM

## 2020-11-13 DIAGNOSIS — E1342 Other specified diabetes mellitus with diabetic polyneuropathy: Secondary | ICD-10-CM

## 2020-11-13 LAB — PROTEIN ELECTROPHORESIS, SERUM

## 2020-11-13 LAB — COPPER, SERUM: Copper: 129 ug/dL (ref 70–175)

## 2020-11-13 NOTE — Telephone Encounter (Signed)
-----   Message from Glendale Chard, DO sent at 11/13/2020 10:50 AM EST ----- Please inform pt that his vitamin B12 is low-normal and folate is very low.  Start OTC vitamin B12 daily and folic acid 1mg  daily.  This may be causing some of his numbness.  Also, please have him return at [facility] convenience to get protein electropheresis and IFE collected -unfortunately, there was an issue with the sample.  Thanks.

## 2020-11-13 NOTE — Telephone Encounter (Signed)
Called patient and informed him of lab results and recommendations. Also, patient advised to come back when he is able to to repeat other labs that leaked during transit. Patient understood all instruction, recommendations, and results. No questions from patient.

## 2020-11-14 ENCOUNTER — Telehealth: Payer: Self-pay | Admitting: Neurology

## 2020-11-14 ENCOUNTER — Telehealth: Payer: Self-pay

## 2020-11-14 DIAGNOSIS — I4891 Unspecified atrial fibrillation: Secondary | ICD-10-CM | POA: Diagnosis not present

## 2020-11-14 DIAGNOSIS — R7989 Other specified abnormal findings of blood chemistry: Secondary | ICD-10-CM

## 2020-11-14 DIAGNOSIS — L89152 Pressure ulcer of sacral region, stage 2: Secondary | ICD-10-CM | POA: Diagnosis not present

## 2020-11-14 DIAGNOSIS — R5381 Other malaise: Secondary | ICD-10-CM | POA: Diagnosis not present

## 2020-11-14 DIAGNOSIS — I482 Chronic atrial fibrillation, unspecified: Secondary | ICD-10-CM | POA: Diagnosis not present

## 2020-11-14 DIAGNOSIS — E1142 Type 2 diabetes mellitus with diabetic polyneuropathy: Secondary | ICD-10-CM | POA: Diagnosis not present

## 2020-11-14 DIAGNOSIS — I5032 Chronic diastolic (congestive) heart failure: Secondary | ICD-10-CM | POA: Diagnosis not present

## 2020-11-14 DIAGNOSIS — Z48815 Encounter for surgical aftercare following surgery on the digestive system: Secondary | ICD-10-CM | POA: Diagnosis not present

## 2020-11-14 DIAGNOSIS — I110 Hypertensive heart disease with heart failure: Secondary | ICD-10-CM | POA: Diagnosis not present

## 2020-11-14 DIAGNOSIS — J189 Pneumonia, unspecified organism: Secondary | ICD-10-CM | POA: Diagnosis not present

## 2020-11-14 LAB — PROTEIN ELECTROPHORESIS, SERUM
Albumin ELP: 3.2 g/dL — ABNORMAL LOW (ref 3.8–4.8)
Alpha 1: 0.5 g/dL — ABNORMAL HIGH (ref 0.2–0.3)
Alpha 2: 1.1 g/dL — ABNORMAL HIGH (ref 0.5–0.9)
Beta 2: 0.3 g/dL (ref 0.2–0.5)
Beta Globulin: 0.5 g/dL (ref 0.4–0.6)
Gamma Globulin: 1.5 g/dL (ref 0.8–1.7)
Total Protein: 7 g/dL (ref 6.1–8.1)

## 2020-11-14 NOTE — Telephone Encounter (Signed)
Patient stated a referral for [facility] was supposed to be sent, but they stated they did not receive it. Can it be faxed to them?

## 2020-11-14 NOTE — Telephone Encounter (Signed)
Spoke with patient regarding results and recommendation.  Patient verbalizes understanding and is agreeable to plan of care. Advised patient to call back with any issues or concerns.  

## 2020-11-14 NOTE — Telephone Encounter (Signed)
[facility] refaxing .

## 2020-11-14 NOTE — Telephone Encounter (Signed)
-----   Message from Abram J Krasowski, MD sent at 11/13/2020  9:43 AM EST ----- Creatinine elevated but still acceptable.  Lets recheck his Chem-7 within a week 

## 2020-11-14 NOTE — Telephone Encounter (Signed)
Left message on patients voicemail to please return our call.   

## 2020-11-14 NOTE — Telephone Encounter (Signed)
-----   Message from Georgeanna Lea, MD sent at 11/13/2020  9:43 AM EST ----- Creatinine elevated but still acceptable.  Lets recheck his Chem-7 within a week

## 2020-11-14 NOTE — Telephone Encounter (Signed)
    Pt is calling back wanted to speak with Lequita Halt again

## 2020-11-15 ENCOUNTER — Encounter: Payer: Medicare Other | Admitting: Neurology

## 2020-11-15 DIAGNOSIS — I110 Hypertensive heart disease with heart failure: Secondary | ICD-10-CM | POA: Diagnosis not present

## 2020-11-15 DIAGNOSIS — Z48815 Encounter for surgical aftercare following surgery on the digestive system: Secondary | ICD-10-CM | POA: Diagnosis not present

## 2020-11-15 DIAGNOSIS — J189 Pneumonia, unspecified organism: Secondary | ICD-10-CM | POA: Diagnosis not present

## 2020-11-15 DIAGNOSIS — E1142 Type 2 diabetes mellitus with diabetic polyneuropathy: Secondary | ICD-10-CM | POA: Diagnosis not present

## 2020-11-15 DIAGNOSIS — I482 Chronic atrial fibrillation, unspecified: Secondary | ICD-10-CM | POA: Diagnosis not present

## 2020-11-15 DIAGNOSIS — L89152 Pressure ulcer of sacral region, stage 2: Secondary | ICD-10-CM | POA: Diagnosis not present

## 2020-11-20 ENCOUNTER — Encounter (HOSPITAL_COMMUNITY): Admission: RE | Payer: Self-pay | Source: Home / Self Care

## 2020-11-20 ENCOUNTER — Ambulatory Visit (HOSPITAL_COMMUNITY): Admission: RE | Admit: 2020-11-20 | Payer: Medicare Other | Source: Home / Self Care | Admitting: Orthopedic Surgery

## 2020-11-20 SURGERY — ARTHROPLASTY, KNEE, TOTAL
Anesthesia: Spinal | Site: Knee | Laterality: Right

## 2020-11-21 DIAGNOSIS — Z48815 Encounter for surgical aftercare following surgery on the digestive system: Secondary | ICD-10-CM | POA: Diagnosis not present

## 2020-11-21 DIAGNOSIS — R7989 Other specified abnormal findings of blood chemistry: Secondary | ICD-10-CM | POA: Diagnosis not present

## 2020-11-21 DIAGNOSIS — L89152 Pressure ulcer of sacral region, stage 2: Secondary | ICD-10-CM | POA: Diagnosis not present

## 2020-11-21 DIAGNOSIS — J189 Pneumonia, unspecified organism: Secondary | ICD-10-CM | POA: Diagnosis not present

## 2020-11-21 DIAGNOSIS — R202 Paresthesia of skin: Secondary | ICD-10-CM | POA: Diagnosis not present

## 2020-11-21 DIAGNOSIS — I482 Chronic atrial fibrillation, unspecified: Secondary | ICD-10-CM | POA: Diagnosis not present

## 2020-11-21 DIAGNOSIS — E1342 Other specified diabetes mellitus with diabetic polyneuropathy: Secondary | ICD-10-CM | POA: Diagnosis not present

## 2020-11-21 DIAGNOSIS — I110 Hypertensive heart disease with heart failure: Secondary | ICD-10-CM | POA: Diagnosis not present

## 2020-11-21 DIAGNOSIS — E1142 Type 2 diabetes mellitus with diabetic polyneuropathy: Secondary | ICD-10-CM | POA: Diagnosis not present

## 2020-11-21 NOTE — Addendum Note (Signed)
Addended by: Hazle Quant on: 11/21/2020 11:29 AM   Modules accepted: Orders

## 2020-11-22 ENCOUNTER — Other Ambulatory Visit: Payer: Self-pay

## 2020-11-22 ENCOUNTER — Telehealth: Payer: Self-pay | Admitting: Emergency Medicine

## 2020-11-22 DIAGNOSIS — R7989 Other specified abnormal findings of blood chemistry: Secondary | ICD-10-CM

## 2020-11-22 LAB — BASIC METABOLIC PANEL
BUN/Creatinine Ratio: 16 (ref 10–24)
BUN: 24 mg/dL (ref 8–27)
CO2: 23 mmol/L (ref 20–29)
Calcium: 8.6 mg/dL (ref 8.6–10.2)
Chloride: 103 mmol/L (ref 96–106)
Creatinine, Ser: 1.52 mg/dL — ABNORMAL HIGH (ref 0.76–1.27)
GFR calc Af Amer: 52 mL/min/{1.73_m2} — ABNORMAL LOW (ref >59)
GFR calc non Af Amer: 45 mL/min/{1.73_m2} — ABNORMAL LOW (ref >59)
Glucose: 203 mg/dL — ABNORMAL HIGH (ref 65–99)
Potassium: 4.2 mmol/L (ref 3.5–5.2)
Sodium: 139 mmol/L (ref 134–144)

## 2020-11-22 NOTE — Telephone Encounter (Signed)
-----   Message from Jeremiah Lea, MD sent at 11/22/2020  2:10 PM EST ----- Creatinine is a little elevated but still acceptable.  Please recheck his Chem-7 within next 2 weeks

## 2020-11-22 NOTE — Telephone Encounter (Signed)
Called patient informed him of results and to have labs drawn within next 2 weeks. No further questions.

## 2020-11-23 ENCOUNTER — Other Ambulatory Visit: Payer: Self-pay

## 2020-11-23 DIAGNOSIS — Z20822 Contact with and (suspected) exposure to covid-19: Secondary | ICD-10-CM | POA: Diagnosis not present

## 2020-11-23 DIAGNOSIS — Z09 Encounter for follow-up examination after completed treatment for conditions other than malignant neoplasm: Secondary | ICD-10-CM | POA: Diagnosis not present

## 2020-11-23 DIAGNOSIS — J101 Influenza due to other identified influenza virus with other respiratory manifestations: Secondary | ICD-10-CM | POA: Diagnosis not present

## 2020-11-27 ENCOUNTER — Telehealth: Payer: Self-pay | Admitting: Emergency Medicine

## 2020-11-27 ENCOUNTER — Other Ambulatory Visit: Payer: Self-pay

## 2020-11-27 DIAGNOSIS — R2689 Other abnormalities of gait and mobility: Secondary | ICD-10-CM | POA: Diagnosis not present

## 2020-11-27 NOTE — Telephone Encounter (Signed)
Lab results routed to Dr. Posey Pronto per Dr. Agustin Cree.

## 2020-11-28 ENCOUNTER — Ambulatory Visit: Payer: Medicare Other | Admitting: Cardiology

## 2020-11-28 ENCOUNTER — Telehealth: Payer: Self-pay | Admitting: Cardiology

## 2020-11-28 ENCOUNTER — Other Ambulatory Visit: Payer: Self-pay

## 2020-11-28 NOTE — Telephone Encounter (Signed)
Called spoke to patient. Informed him to increase hydralazine to 50 mg 3 times daily. He will do this and let us know if it helps. No further questions at this time.

## 2020-11-28 NOTE — Telephone Encounter (Signed)
Called and spoke to patient. He reports that he left blood pressure and heart rate results at [facility] office he wants Dr. Agustin Cree to review and determine if he needs to be put on more blood pressure medicine. Right now he takes hydralazine 50 mg 1- 2 times a day. He also still takes furosemide 20 mg daily. Based on recent lab results he would like to know if he should continue this due to creatinine being elevated. He was supposed to have appointment today and get labs redone but has a cough so he cancelled. Will consult with Dr. Agustin Cree for further recommendation.

## 2020-11-28 NOTE — Telephone Encounter (Signed)
Please increase hydralazine to 50 mg 3 times a day, I did review his blood pressure results which elevated

## 2020-11-28 NOTE — Telephone Encounter (Signed)
Pt came in today with a cough so had to reschedule. Dropped off bp record with concerns that his blood pressure is high, and his heart rate is low. Also had questions about furosemide.  Best number to contact- [phone]  BP record in Dr. Marthann Schiller box

## 2020-11-30 DIAGNOSIS — J028 Acute pharyngitis due to other specified organisms: Secondary | ICD-10-CM | POA: Diagnosis not present

## 2020-11-30 DIAGNOSIS — U071 COVID-19: Secondary | ICD-10-CM | POA: Diagnosis not present

## 2020-11-30 LAB — PROTEIN ELECTROPHORESIS
A/G Ratio: 0.9 (ref 0.7–1.7)
Albumin ELP: 3.3 g/dL (ref 2.9–4.4)
Alpha 1: 0.3 g/dL (ref 0.0–0.4)
Alpha 2: 1 g/dL (ref 0.4–1.0)
Beta: 0.9 g/dL (ref 0.7–1.3)
Gamma Globulin: 1.6 g/dL (ref 0.4–1.8)
Globulin, Total: 3.7 g/dL (ref 2.2–3.9)
Total Protein: 7 g/dL (ref 6.0–8.5)

## 2020-11-30 LAB — SPECIMEN STATUS REPORT

## 2020-11-30 LAB — IMMUNOFIXATION, SERUM
IgA/Immunoglobulin A, Serum: 208 mg/dL (ref 61–437)
IgG (Immunoglobin G), Serum: 1451 mg/dL (ref 603–1613)
IgM (Immunoglobulin M), Srm: 526 mg/dL — ABNORMAL HIGH (ref 15–143)

## 2020-12-06 DIAGNOSIS — R2689 Other abnormalities of gait and mobility: Secondary | ICD-10-CM | POA: Diagnosis not present

## 2020-12-08 DIAGNOSIS — R2689 Other abnormalities of gait and mobility: Secondary | ICD-10-CM | POA: Diagnosis not present

## 2020-12-12 DIAGNOSIS — R2689 Other abnormalities of gait and mobility: Secondary | ICD-10-CM | POA: Diagnosis not present

## 2020-12-13 DIAGNOSIS — R2689 Other abnormalities of gait and mobility: Secondary | ICD-10-CM | POA: Diagnosis not present

## 2020-12-15 DIAGNOSIS — R2689 Other abnormalities of gait and mobility: Secondary | ICD-10-CM | POA: Diagnosis not present

## 2020-12-18 DIAGNOSIS — I480 Paroxysmal atrial fibrillation: Secondary | ICD-10-CM | POA: Diagnosis not present

## 2020-12-18 DIAGNOSIS — M25569 Pain in unspecified knee: Secondary | ICD-10-CM | POA: Diagnosis not present

## 2020-12-18 DIAGNOSIS — I10 Essential (primary) hypertension: Secondary | ICD-10-CM | POA: Diagnosis not present

## 2020-12-18 DIAGNOSIS — R490 Dysphonia: Secondary | ICD-10-CM | POA: Diagnosis not present

## 2020-12-19 DIAGNOSIS — R2689 Other abnormalities of gait and mobility: Secondary | ICD-10-CM | POA: Diagnosis not present

## 2020-12-20 ENCOUNTER — Ambulatory Visit: Payer: Medicare Other | Admitting: Cardiology

## 2020-12-21 ENCOUNTER — Other Ambulatory Visit: Payer: Self-pay

## 2020-12-21 ENCOUNTER — Encounter: Payer: Self-pay | Admitting: Cardiology

## 2020-12-21 ENCOUNTER — Ambulatory Visit (INDEPENDENT_AMBULATORY_CARE_PROVIDER_SITE_OTHER): Payer: Medicare Other | Admitting: Cardiology

## 2020-12-21 VITALS — BP 170/62 | HR 62 | Ht 69.0 in | Wt 196.0 lb

## 2020-12-21 DIAGNOSIS — E782 Mixed hyperlipidemia: Secondary | ICD-10-CM

## 2020-12-21 DIAGNOSIS — I480 Paroxysmal atrial fibrillation: Secondary | ICD-10-CM | POA: Diagnosis not present

## 2020-12-21 DIAGNOSIS — E119 Type 2 diabetes mellitus without complications: Secondary | ICD-10-CM | POA: Diagnosis not present

## 2020-12-21 DIAGNOSIS — I10 Essential (primary) hypertension: Secondary | ICD-10-CM

## 2020-12-21 MED ORDER — AMLODIPINE BESYLATE 10 MG PO TABS: 10.0000 mg | ORAL_TABLET | Freq: Every day | ORAL | 2 refills | Status: DC

## 2020-12-21 MED ORDER — AMIODARONE HCL 100 MG PO TABS: 100.0000 mg | ORAL_TABLET | Freq: Every day | ORAL | 2 refills | Status: DC

## 2020-12-21 NOTE — Progress Notes (Signed)
Cardiology Office Note:    Date:  12/21/2020   ID:  Jeremiah Sheriff., DOB 11/20/1945, MRN [id number]  PCP:  [name], MD  Cardiologist:  [name], MD    Referring MD: [name], [name], MD   Chief Complaint  Patient presents with  . Atrial Fibrillation  . Hospitalization Follow-up  I am doing better  History of Present Illness:    Jeremiah Beasley. is a 75 y.o. male with past medical history significant for paroxysmal atrial fibrillation, initially successfully suppressed with amiodarone but because of bradycardia amiodarone has been discontinued.  He also got history of diabetes, hypertension, hyperlipidemia.  He is intolerant to ACE inhibitor because of tongue swelling.  He was in the hospital because of gallbladder problem required cholecystectomy.  Did quite well after that except for episode of atrial fibrillation after surgery.  Last time I seen him he states was in atrial fibrillation he is anticoagulated will continue.  He comes today to my office discuss those issues.  He did have COVID-19 infection recovered from it.  A few days ago he noticed his heart rate being 48 he cut down his amiodarone from 200 mg to only 100 mg a day.  Doing well denies have any shortness of breath, swelling of lower extremities gone.  No chest pain tightness squeezing pressure burning chest no palpitations.  Past Medical History:  Diagnosis Date  . Atrial fibrillation ([place])   . Chronic pain of right knee 04/11/2020  . Diabetes mellitus without complication ([place])   . Essential hypertension 05/15/2015  . Hyperlipidemia   . Hypertension   . Paroxysmal atrial fibrillation ([place]) 05/15/2015  . Pneumonia 10/20/2020  . Pure hypercholesterolemia 05/15/2015  . S/P right knee arthroscopy 04/25/2020  . Type 2 diabetes mellitus without complication ([place]) 11/26/3233    Past Surgical History:  Procedure Laterality Date  . gall bladder removed   10/20/2020  . NO PAST SURGERIES      Current  Medications: Current Meds  Medication Sig  . amiodarone (PACERONE) 200 MG tablet Take 1 tablet (200 mg total) by mouth daily.  Marland Kitchen aspirin EC 81 MG tablet Take 81 mg by mouth daily.  . cyanocobalamin 100 MCG tablet Take 100 mcg by mouth daily.  . digoxin (LANOXIN) 0.125 MG tablet Take 0.125 mg by mouth every other day.  . ezetimibe (ZETIA) 10 MG tablet Take 1 tablet (10 mg total) by mouth daily.  . folic acid (FOLVITE) 1 MG tablet Take 1 tablet by mouth daily.  . furosemide (LASIX) 20 MG tablet Take 20 mg by mouth daily.  Marland Kitchen gabapentin (NEURONTIN) 300 MG capsule Take 1 capsule (300 mg total) by mouth 3 (three) times daily.  . hydrALAZINE (APRESOLINE) 50 MG tablet Take 50 mg by mouth 3 (three) times daily.  Marland Kitchen levothyroxine (SYNTHROID, LEVOTHROID) 75 MCG tablet Take 1 tablet by mouth daily.  Marland Kitchen NOVOLOG 100 UNIT/ML injection   . Probiotic Product (FLORA-Q PO) Take by mouth. 1 tab oral twice daily at lunch and supper  . rivaroxaban (XARELTO) 20 MG TABS tablet Take 1 tablet by mouth daily.  . simvastatin (ZOCOR) 80 MG tablet Take 80 mg by mouth. 80 mg orally every morning     Allergies:   Methaqualone and Quinapril   Social History   Socioeconomic History  . Marital status: Married    Spouse name: Not on file  . Number of children: Not on file  . Years of education: Not on file  . Highest education  level: Not on file  Occupational History  . Not on file  Tobacco Use  . Smoking status: Never Smoker  . Smokeless tobacco: Current User  Vaping Use  . Vaping Use: Never used  Substance and Sexual Activity  . Alcohol use: No  . Drug use: No  . Sexual activity: Not on file  Other Topics Concern  . Not on file  Social History Narrative  . Not on file   Social Determinants of Health   Financial Resource Strain: Not on file  Food Insecurity: Not on file  Transportation Needs: Not on file  Physical Activity: Not on file  Stress: Not on file  Social Connections: Not on file      Family History: The patient's family history includes Heart attack in his father; Hyperlipidemia in his father; Stroke in his father. ROS:   Please see the history of present illness.    All 14 point review of systems negative except as described per history of present illness  EKGs/Labs/Other Studies Reviewed:      Recent Labs: 10/02/2020: NT-Pro BNP 575 11/08/2020: TSH 2.95 11/21/2020: BUN 24; Creatinine, Ser 1.52; Potassium 4.2; Sodium 139  Recent Lipid Panel    Component Value Date/Time   CHOL 134 05/31/2020 1324   TRIG 82 05/31/2020 1324   HDL 32 (L) 05/31/2020 1324   CHOLHDL 4.2 05/31/2020 1324   LDLCALC 86 05/31/2020 1324    Physical Exam:    VS:  BP (!) 170/62 (BP Location: Left Arm, Patient Position: Sitting)   Pulse 62   Ht 5\' 9"  (1.753 m)   Wt 196 lb (88.9 kg)   SpO2 96%   BMI 28.94 kg/m     Wt Readings from Last 3 Encounters:  12/21/20 196 lb (88.9 kg)  11/08/20 195 lb (88.5 kg)  11/03/20 199 lb (90.3 kg)     GEN:  Well nourished, well developed in no acute distress HEENT: Normal NECK: No JVD; No carotid bruits LYMPHATICS: No lymphadenopathy CARDIAC: RRR, no murmurs, no rubs, no gallops RESPIRATORY:  Clear to auscultation without rales, wheezing or rhonchi  ABDOMEN: Soft, non-tender, non-distended MUSCULOSKELETAL:  No edema; No deformity  SKIN: Warm and dry LOWER EXTREMITIES: no swelling NEUROLOGIC:  Alert and oriented x 3 PSYCHIATRIC:  Normal affect   ASSESSMENT:    1. Paroxysmal atrial fibrillation (HCC)   2. Essential hypertension   3. Type 2 diabetes mellitus without complication, without long-term current use of insulin ([place])   4. Mixed hyperlipidemia    PLAN:    In order of problems listed above:  1. Paroxysmal atrial fibrillation.  EKG today shows sinus rhythm.  He is anticoagulant Xarelto which I will continue we will continue with amiodarone her milligrams daily. 2. Essential hypertension: Out of control, he brought results of  his blood pressure measurements from home still clearly not well controlled.  I will reinitiate amlodipine 10 mg daily.  We will do Chem-7 today. 3. Type 2 diabetes followed by internal medicine team. 4. Mixed dyslipidemia: He is on statin which I will continue letter when he recovers of all his sicknesses we will recheck his fasting lipid profile.   Medication Adjustments/Labs and Tests Ordered: Current medicines are reviewed at length with the patient today.  Concerns regarding medicines are outlined above.  No orders of the defined types were placed in this encounter.  Medication changes: No orders of the defined types were placed in this encounter.   Signed, Park Liter, MD, [facility] 12/21/2020 10:16 AM  [facility]

## 2020-12-21 NOTE — Addendum Note (Signed)
Addended by: Senaida Ores on: 12/21/2020 10:24 AM   Modules accepted: Orders

## 2020-12-21 NOTE — Patient Instructions (Signed)
Medication Instructions:  Your physician has recommended you make the following change in your medication:   Start: Amlodipine 10 mg daily   DECREASE: Amiodarone 100 mg daily   *If you need a refill on your cardiac medications before your next appointment, please call your pharmacy*   Lab Work: Your physician recommends that you return for lab work today: bmp   If you have labs (blood work) drawn today and your tests are completely normal, you will receive your results only by: Marland Kitchen MyChart Message (if you have MyChart) OR . A paper copy in the mail If you have any lab test that is abnormal or we need to change your treatment, we will call you to review the results.   Testing/Procedures: None   Follow-Up: At [facility], you and your health needs are our priority.  As part of our continuing mission to provide you with exceptional heart care, we have created designated Provider Care Teams.  These Care Teams include your primary Cardiologist (physician) and Advanced Practice Providers (APPs -  Physician Assistants and Nurse Practitioners) who all work together to provide you with the care you need, when you need it.  We recommend signing up for the patient portal called "MyChart".  Sign up information is provided on this After Visit Summary.  MyChart is used to connect with patients for Virtual Visits (Telemedicine).  Patients are able to view lab/test results, encounter notes, upcoming appointments, etc.  Non-urgent messages can be sent to your provider as well.   To learn more about what you can do with MyChart, go to [email or web address].    Your next appointment:   2 month(s)  The format for your next appointment:   In Person  Provider:   [name], MD   Other Instructions  Amlodipine Tablets What is this medicine? AMLODIPINE (am LOE di peen) is a calcium channel blocker. It relaxes your blood vessels and decreases the amount of work the heart has to do. It  treats high blood pressure and/or prevents chest pain (also called angina). This medicine may be used for other purposes; ask your health care provider or pharmacist if you have questions. COMMON BRAND NAME(S): Norvasc What should I tell my health care provider before I take this medicine? They need to know if you have any of these conditions:  heart disease  liver disease  an unusual or allergic reaction to amlodipine, other drugs, foods, dyes, or preservatives  pregnant or trying to get pregnant  breast-feeding How should I use this medicine? Take this medicine by mouth. Take it as directed on the prescription label at the same time every day. You can take it with or without food. If it upsets your stomach, take it with food. Keep taking it unless your health care provider tells you to stop. Talk to your health care provider about the use of this medicine in children. While it may be prescribed for children as young as 6 for selected conditions, precautions do apply. Overdosage: If you think you have taken too much of this medicine contact a poison control center or emergency room at once. NOTE: This medicine is only for you. Do not share this medicine with others. What if I miss a dose? If you miss a dose, take it as soon as you can. If it is almost time for your next dose, take only that dose. Do not take double or extra doses. What may interact with this medicine? This medicine may interact with the following  medications:  clarithromycin  cyclosporine  diltiazem  itraconazole  simvastatin  tacrolimus This list may not describe all possible interactions. Give your health care provider a list of all the medicines, herbs, non-prescription drugs, or dietary supplements you use. Also tell them if you smoke, drink alcohol, or use illegal drugs. Some items may interact with your medicine. What should I watch for while using this medicine? Visit your health care provider for  regular checks on your progress. Check your blood pressure as directed. Ask your health care provider what your blood pressure should be. Also, find out when you should contact him or her. Do not treat yourself for coughs, colds, or pain while you are using this medicine without asking your health care provider for advice. Some medicines may increase your blood pressure. You may get drowsy or dizzy. Do not drive, use machinery, or do anything that needs mental alertness until you know how this medicine affects you. Do not stand up or sit up quickly, especially if you are an older patient. This reduces the risk of dizzy or fainting spells. Alcohol can make you more drowsy and dizzy. Avoid alcoholic drinks. What side effects may I notice from receiving this medicine? Side effects that you should report to your doctor or health care provider as soon as possible:  allergic reactions (skin rash, itching or hives; swelling of the face, lips, or tongue)  heart attack (trouble breathing; pain or tightness in the chest, neck, back or arms; unusually weak or tired)  low blood pressure (dizziness; feeling faint or lightheaded, falls; unusually weak or tired) Side effects that usually do not require medical attention (report these to your doctor or health care provider if they continue or are bothersome):  facial flushing  nausea  palpitations  stomach pain  sudden weight gain  swelling of the ankles, feet, hands This list may not describe all possible side effects. Call your doctor for medical advice about side effects. You may report side effects to FDA at 1-800-FDA-1088. Where should I keep my medicine? Keep out of the reach of children and pets. Store at room temperature between 20 and 25 degrees C (68 and 77 degrees F). Protect from light and moisture. Keep the container tightly closed. Get rid of any unused medicine after the expiration date. To get rid of medicines that are no longer needed or  have expired:  Take the medicine to a medicine take-back program. Check with your pharmacy or law enforcement to find a location.  If you cannot return the medicine, check the label or package insert to see if the medicine should be thrown out in the garbage or flushed down the toilet. If you are not sure, ask your health care provider. If it is safe to put in the trash, empty the medicine out of the container. Mix the medicine with cat litter, dirt, coffee grounds, or other unwanted substance. Seal the mixture in a bag or container. Put it in the trash. NOTE: This sheet is a summary. It may not cover all possible information. If you have questions about this medicine, talk to your doctor, pharmacist, or health care provider.  2021 Elsevier/Gold Standard (2020-09-30 14:59:47)

## 2020-12-22 ENCOUNTER — Telehealth: Payer: Self-pay | Admitting: Cardiology

## 2020-12-22 DIAGNOSIS — R2689 Other abnormalities of gait and mobility: Secondary | ICD-10-CM | POA: Diagnosis not present

## 2020-12-22 LAB — BASIC METABOLIC PANEL
BUN/Creatinine Ratio: 19 (ref 10–24)
BUN: 27 mg/dL (ref 8–27)
CO2: 24 mmol/L (ref 20–29)
Calcium: 9 mg/dL (ref 8.6–10.2)
Chloride: 102 mmol/L (ref 96–106)
Creatinine, Ser: 1.44 mg/dL — ABNORMAL HIGH (ref 0.76–1.27)
GFR calc Af Amer: 56 mL/min/{1.73_m2} — ABNORMAL LOW (ref >59)
GFR calc non Af Amer: 48 mL/min/{1.73_m2} — ABNORMAL LOW (ref >59)
Glucose: 208 mg/dL — ABNORMAL HIGH (ref 65–99)
Potassium: 4.3 mmol/L (ref 3.5–5.2)
Sodium: 137 mmol/L (ref 134–144)

## 2020-12-22 NOTE — Telephone Encounter (Signed)
Jeremiah Beasley is returning Jeremiah Beasley's call in regards to his lab results. Please advise.

## 2020-12-22 NOTE — Telephone Encounter (Signed)
Spoke with patient, verberilzed undersanding and had no questions at this time.  

## 2020-12-26 DIAGNOSIS — R2689 Other abnormalities of gait and mobility: Secondary | ICD-10-CM | POA: Diagnosis not present

## 2020-12-28 DIAGNOSIS — R2689 Other abnormalities of gait and mobility: Secondary | ICD-10-CM | POA: Diagnosis not present

## 2021-01-03 ENCOUNTER — Ambulatory Visit (INDEPENDENT_AMBULATORY_CARE_PROVIDER_SITE_OTHER): Payer: Medicare Other | Admitting: Otolaryngology

## 2021-01-03 DIAGNOSIS — R2689 Other abnormalities of gait and mobility: Secondary | ICD-10-CM | POA: Diagnosis not present

## 2021-01-05 DIAGNOSIS — R2689 Other abnormalities of gait and mobility: Secondary | ICD-10-CM | POA: Diagnosis not present

## 2021-01-08 DIAGNOSIS — R2689 Other abnormalities of gait and mobility: Secondary | ICD-10-CM | POA: Diagnosis not present

## 2021-01-09 DIAGNOSIS — M1711 Unilateral primary osteoarthritis, right knee: Secondary | ICD-10-CM | POA: Diagnosis not present

## 2021-01-09 DIAGNOSIS — Z4789 Encounter for other orthopedic aftercare: Secondary | ICD-10-CM | POA: Diagnosis not present

## 2021-01-10 DIAGNOSIS — Z7901 Long term (current) use of anticoagulants: Secondary | ICD-10-CM | POA: Diagnosis not present

## 2021-01-10 DIAGNOSIS — I10 Essential (primary) hypertension: Secondary | ICD-10-CM | POA: Diagnosis not present

## 2021-01-10 DIAGNOSIS — M1711 Unilateral primary osteoarthritis, right knee: Secondary | ICD-10-CM | POA: Diagnosis not present

## 2021-01-10 DIAGNOSIS — Z6828 Body mass index (BMI) 28.0-28.9, adult: Secondary | ICD-10-CM | POA: Diagnosis not present

## 2021-01-10 DIAGNOSIS — G894 Chronic pain syndrome: Secondary | ICD-10-CM | POA: Insufficient documentation

## 2021-01-11 DIAGNOSIS — R2689 Other abnormalities of gait and mobility: Secondary | ICD-10-CM | POA: Diagnosis not present

## 2021-01-16 DIAGNOSIS — E119 Type 2 diabetes mellitus without complications: Secondary | ICD-10-CM | POA: Diagnosis not present

## 2021-01-17 ENCOUNTER — Other Ambulatory Visit: Payer: Self-pay

## 2021-01-17 ENCOUNTER — Encounter (INDEPENDENT_AMBULATORY_CARE_PROVIDER_SITE_OTHER): Payer: Self-pay | Admitting: Otolaryngology

## 2021-01-17 ENCOUNTER — Ambulatory Visit (INDEPENDENT_AMBULATORY_CARE_PROVIDER_SITE_OTHER): Payer: Medicare Other | Admitting: Otolaryngology

## 2021-01-17 VITALS — Temp 97.3°F

## 2021-01-17 DIAGNOSIS — R490 Dysphonia: Secondary | ICD-10-CM | POA: Diagnosis not present

## 2021-01-17 DIAGNOSIS — K219 Gastro-esophageal reflux disease without esophagitis: Secondary | ICD-10-CM | POA: Diagnosis not present

## 2021-01-17 NOTE — Progress Notes (Signed)
HPI: Jeremiah Beasley. is a 75 y.o. male who presents is referred by his PCP at [facility] primary care in [place] for evaluation of hoarseness.Marland Kitchen  He states that ever since his hospitalization in December his voice is never gone back to normal.  Apparently he had acute cystitis with rupture of his gallbladder.  He also had pneumonia and was in the hospital for a couple of weeks following hospitalization the first week of December.  Since that time he feels like he has been hoarse although his voice is doing a little bit better but it is not back to normal.  He has undergone physical therapy because of overall weakness.  His breathing is doing much better.  However over the past 2 weeks he has had chronic hiccups. He does state that he takes Tums occasionally for reflux symptoms. He does chew tobacco although he does not smoke. On discussion with him in the office today he really has only minimal hoarseness.  Past Medical History:  Diagnosis Date  . Atrial fibrillation ([place])   . Chronic pain of right knee 04/11/2020  . Diabetes mellitus without complication ([place])   . Essential hypertension 05/15/2015  . Hyperlipidemia   . Hypertension   . Paroxysmal atrial fibrillation ([place]) 05/15/2015  . Pneumonia 10/20/2020  . Pure hypercholesterolemia 05/15/2015  . S/P right knee arthroscopy 04/25/2020  . Type 2 diabetes mellitus without complication ([place]) 1/85/6314   Past Surgical History:  Procedure Laterality Date  . gall bladder removed   10/20/2020  . NO PAST SURGERIES     Social History   Socioeconomic History  . Marital status: Married    Spouse name: Not on file  . Number of children: Not on file  . Years of education: Not on file  . Highest education level: Not on file  Occupational History  . Not on file  Tobacco Use  . Smoking status: Never Smoker  . Smokeless tobacco: Current User  Vaping Use  . Vaping Use: Never used  Substance and Sexual Activity  . Alcohol use: No  . Drug use: No   . Sexual activity: Not on file  Other Topics Concern  . Not on file  Social History Narrative  . Not on file   Social Determinants of Health   Financial Resource Strain: Not on file  Food Insecurity: Not on file  Transportation Needs: Not on file  Physical Activity: Not on file  Stress: Not on file  Social Connections: Not on file   Family History  Problem Relation Age of Onset  . Heart attack Father   . Hyperlipidemia Father   . Stroke Father    Allergies  Allergen Reactions  . Methaqualone Other (See Comments)    unknown  . Quinapril    Prior to Admission medications   Medication Sig Start Date End Date Taking? Authorizing Provider  amiodarone (PACERONE) 100 MG tablet Take 1 tablet (100 mg total) by mouth daily. 12/21/20   Park Liter, MD  amLODipine (NORVASC) 10 MG tablet Take 1 tablet (10 mg total) by mouth daily. 12/21/20 03/21/21  Park Liter, MD  aspirin EC 81 MG tablet Take 81 mg by mouth daily.    [provider]  cyanocobalamin 100 MCG tablet Take 100 mcg by mouth daily.    [provider]  digoxin (LANOXIN) 0.125 MG tablet Take 0.125 mg by mouth every other day.    [provider]  ezetimibe (ZETIA) 10 MG tablet Take 1 tablet (10 mg  total) by mouth daily. 06/08/20 09/06/20  Park Liter, MD  folic acid (FOLVITE) 1 MG tablet Take 1 tablet by mouth daily.    [provider]  furosemide (LASIX) 20 MG tablet Take 20 mg by mouth daily.    [provider]  gabapentin (NEURONTIN) 300 MG capsule Take 1 capsule (300 mg total) by mouth 3 (three) times daily. 11/08/20   Narda Amber K, DO  hydrALAZINE (APRESOLINE) 50 MG tablet Take 50 mg by mouth 3 (three) times daily.    [provider]  levothyroxine (SYNTHROID, LEVOTHROID) 75 MCG tablet Take 1 tablet by mouth daily. 05/22/16   [provider]  NOVOLOG 100 UNIT/ML injection  05/06/19   [provider]  Probiotic Product (FLORA-Q PO) Take  by mouth. 1 tab oral twice daily at lunch and supper    [provider]  rivaroxaban (XARELTO) 20 MG TABS tablet Take 1 tablet by mouth daily. 10/08/16   [provider]  simvastatin (ZOCOR) 80 MG tablet Take 80 mg by mouth. 80 mg orally every morning 03/12/19   [provider]     Positive ROS: Otherwise negative  All other systems have been reviewed and were otherwise negative with the exception of those mentioned in the HPI and as above.  Physical Exam: Constitutional: Alert, well-appearing, no acute distress.  He is breathing well in on speaking with him he has minimal hoarseness. Ears: External ears without lesions or tenderness. Ear canals are clear bilaterally with intact, clear TMs.  Nasal: External nose without lesions. Septum is deviated to the right.. Clear nasal passages otherwise with no signs of infection. Oral: Lips and gums without lesions. Tongue and palate mucosa without lesions. Posterior oropharynx clear.  Tonsils appear symmetric and benign bilaterally no oral mucosal lesions noted.  He states that when he chews tobacco it is more on the right side and the bucca mucosa and floor mouth mucosa on the right side is normal to examination. Fiberoptic laryngoscopy was performed through the left nostril.  The nasopharynx was clear.  The base of tongue vallecula and epiglottis were normal.  The vocal cords were clear bilaterally with no vocal cord lesions noted.  Vocal cords had symmetric mobility.  He had mild arytenoid edema which may be more indicative of reflux type symptoms.  Of note he had intermittent hiccups throughout the endoscopy. Neck: No palpable adenopathy or masses.  No palpable adenopathy on either side of the neck. Respiratory: Breathing comfortably  Skin: No facial/neck lesions or rash noted.  Procedures  Assessment: Hoarseness with essentially normal vocal cord examination on fiberoptic laryngoscopy.  His voice change may be related to  intubation during hospitalization versus laryngeal pharyngeal reflux. Chronic hiccups x2 weeks  Plan: Prescribed omeprazole 40 mg daily before dinner to take for the next 4 weeks to see if this helps at all with his voice as well as his hiccups. Discussed with him that he has normal vocal cord examination with no significant vocal cord pathology noted. He will follow-up as needed   Radene Journey, MD   CC:

## 2021-01-18 DIAGNOSIS — R2689 Other abnormalities of gait and mobility: Secondary | ICD-10-CM | POA: Diagnosis not present

## 2021-01-25 DIAGNOSIS — I10 Essential (primary) hypertension: Secondary | ICD-10-CM | POA: Diagnosis not present

## 2021-01-25 DIAGNOSIS — E1040 Type 1 diabetes mellitus with diabetic neuropathy, unspecified: Secondary | ICD-10-CM | POA: Diagnosis not present

## 2021-01-25 DIAGNOSIS — I4891 Unspecified atrial fibrillation: Secondary | ICD-10-CM | POA: Diagnosis not present

## 2021-01-30 DIAGNOSIS — M1711 Unilateral primary osteoarthritis, right knee: Secondary | ICD-10-CM | POA: Diagnosis not present

## 2021-02-26 ENCOUNTER — Other Ambulatory Visit: Payer: Self-pay

## 2021-02-27 DIAGNOSIS — M1711 Unilateral primary osteoarthritis, right knee: Secondary | ICD-10-CM | POA: Diagnosis not present

## 2021-02-28 ENCOUNTER — Ambulatory Visit (INDEPENDENT_AMBULATORY_CARE_PROVIDER_SITE_OTHER): Payer: Medicare Other | Admitting: Cardiology

## 2021-02-28 ENCOUNTER — Other Ambulatory Visit: Payer: Self-pay

## 2021-02-28 ENCOUNTER — Encounter: Payer: Self-pay | Admitting: Cardiology

## 2021-02-28 VITALS — BP 134/62 | HR 64 | Ht 69.0 in | Wt 195.0 lb

## 2021-02-28 DIAGNOSIS — E119 Type 2 diabetes mellitus without complications: Secondary | ICD-10-CM | POA: Diagnosis not present

## 2021-02-28 DIAGNOSIS — I480 Paroxysmal atrial fibrillation: Secondary | ICD-10-CM | POA: Diagnosis not present

## 2021-02-28 DIAGNOSIS — I10 Essential (primary) hypertension: Secondary | ICD-10-CM

## 2021-02-28 DIAGNOSIS — E782 Mixed hyperlipidemia: Secondary | ICD-10-CM | POA: Diagnosis not present

## 2021-02-28 NOTE — Patient Instructions (Signed)
Medication Instructions:  Your physician recommends that you continue on your current medications as directed. Please refer to the Current Medication list given to you today.  *If you need a refill on your cardiac medications before your next appointment, please call your pharmacy*   Lab Work: None If you have labs (blood work) drawn today and your tests are completely normal, you will receive your results only by: MyChart Message (if you have MyChart) OR A paper copy in the mail If you have any lab test that is abnormal or we need to change your treatment, we will call you to review the results.   Testing/Procedures: None   Follow-Up: At [facility], you and your health needs are our priority.  As part of our continuing mission to provide you with exceptional heart care, we have created designated Provider Care Teams.  These Care Teams include your primary Cardiologist (physician) and Advanced Practice Providers (APPs -  Physician Assistants and Nurse Practitioners) who all work together to provide you with the care you need, when you need it.  We recommend signing up for the patient portal called "MyChart".  Sign up information is provided on this After Visit Summary.  MyChart is used to connect with patients for Virtual Visits (Telemedicine).  Patients are able to view lab/test results, encounter notes, upcoming appointments, etc.  Non-urgent messages can be sent to your provider as well.   To learn more about what you can do with MyChart, go to [email or web address].    Your next appointment:   5 month(s)  The format for your next appointment:   In Person  Provider:   Boaz Krasowski, MD{    Other Instructions    

## 2021-02-28 NOTE — Progress Notes (Signed)
Cardiology Office Note:    Date:  02/28/2021   ID:  Jeremiah Sheriff., DOB [birth year], MRN [id number]  PCP:  [name], MD  Cardiologist:  [name], MD    Referring MD: [name], [name], MD   Chief Complaint  Patient presents with  . Follow-up  I am doing fine  History of Present Illness:    Jeremiah Kincaid. is a 75 y.o. male with past medical history significant for paroxysmal atrial fibrillation, initially successfully suppressed with amiodarone but then became bradycardic and amiodarone actually was discontinued.  Also have history of diabetes, essential hypertension, dyslipidemia.  He is intolerant to ACE inhibitor because of tongue swelling.  Recently he ended going to the hospital with ruptured gallbladder required surgery and went to atrial fibrillation after surgery required amiodarone again to control his rate his amiodarone right now is her milligrams daily only.  He is doing well denies have any chest pain tightness squeezing pressure mid chest no dizziness no passing out.  Overall doing well.  She was getting ready to have right knee replacement surgery done but pause for a few months until he recovers from his abdominal surgery.  He is asking me today what are the chances for him to go to atrial fibrillation when he had surgery on his knee.  I told him chances that he will do go to atrial fibrillation is still there but much better than with ruptured gallbladder that he suffered from.  Past Medical History:  Diagnosis Date  . Atrial fibrillation ([place])   . Chronic pain of right knee 04/11/2020  . Diabetes mellitus without complication ([place])   . Essential hypertension 05/15/2015  . Hyperlipidemia   . Hypertension   . Paroxysmal atrial fibrillation ([place]) 05/15/2015  . Pneumonia 10/20/2020  . Pure hypercholesterolemia 05/15/2015  . S/P right knee arthroscopy 04/25/2020  . Type 2 diabetes mellitus without complication ([place]) 2/87/8676    Past Surgical History:   Procedure Laterality Date  . gall bladder removed   10/20/2020  . NO PAST SURGERIES      Current Medications: Current Meds  Medication Sig  . amiodarone (PACERONE) 100 MG tablet Take 1 tablet (100 mg total) by mouth daily. (Patient taking differently: Take 200 mg by mouth daily.)  . amLODipine (NORVASC) 10 MG tablet Take 1 tablet (10 mg total) by mouth daily. (Patient taking differently: Take 5 mg by mouth daily.)  . aspirin EC 81 MG tablet Take 81 mg by mouth daily.  . cyanocobalamin 100 MCG tablet Take 1,000 mcg by mouth daily.  . digoxin (LANOXIN) 0.125 MG tablet Take 1 tablet by mouth every other day.  . ezetimibe (ZETIA) 10 MG tablet Take 1 tablet (10 mg total) by mouth daily.  . folic acid (FOLVITE) 1 MG tablet Take 1 tablet by mouth daily.  . furosemide (LASIX) 20 MG tablet Take 20 mg by mouth daily.  Marland Kitchen gabapentin (NEURONTIN) 300 MG capsule Take 1 capsule (300 mg total) by mouth 3 (three) times daily. (Patient taking differently: Take 300 mg by mouth 2 (two) times daily.)  . hydrALAZINE (APRESOLINE) 100 MG tablet Take 100 mg by mouth 3 (three) times daily.  Marland Kitchen levothyroxine (SYNTHROID, LEVOTHROID) 75 MCG tablet Take 1 tablet by mouth daily.  Marland Kitchen NOVOLOG 100 UNIT/ML injection Inject 14 Units into the skin 3 (three) times daily with meals.  Marland Kitchen omeprazole (PRILOSEC) 40 MG capsule Take 40 mg by mouth daily after supper.  . Probiotic Product (FLORA-Q PO) Take 1 tablet  by mouth 2 (two) times daily. Unknown strength  . rivaroxaban (XARELTO) 20 MG TABS tablet Take 1 tablet by mouth daily.  . simvastatin (ZOCOR) 80 MG tablet Take 80 mg by mouth. 80 mg orally every morning     Allergies:   Methaqualone and Quinapril   Social History   Socioeconomic History  . Marital status: Married    Spouse name: Not on file  . Number of children: Not on file  . Years of education: Not on file  . Highest education level: Not on file  Occupational History  . Not on file  Tobacco Use  . Smoking  status: Never Smoker  . Smokeless tobacco: Current User  Vaping Use  . Vaping Use: Never used  Substance and Sexual Activity  . Alcohol use: No  . Drug use: No  . Sexual activity: Not on file  Other Topics Concern  . Not on file  Social History Narrative  . Not on file   Social Determinants of Health   Financial Resource Strain: Not on file  Food Insecurity: Not on file  Transportation Needs: Not on file  Physical Activity: Not on file  Stress: Not on file  Social Connections: Not on file     Family History: The patient's family history includes Heart attack in his father; Hyperlipidemia in his father; Stroke in his father. ROS:   Please see the history of present illness.    All 14 point review of systems negative except as described per history of present illness  EKGs/Labs/Other Studies Reviewed:      Recent Labs: 10/02/2020: NT-Pro BNP 575 11/08/2020: TSH 2.95 12/21/2020: BUN 27; Creatinine, Ser 1.44; Potassium 4.3; Sodium 137  Recent Lipid Panel    Component Value Date/Time   CHOL 134 05/31/2020 1324   TRIG 82 05/31/2020 1324   HDL 32 (L) 05/31/2020 1324   CHOLHDL 4.2 05/31/2020 1324   LDLCALC 86 05/31/2020 1324    Physical Exam:    VS:  BP 134/62 (BP Location: Right Arm, Patient Position: Sitting)   Pulse 64   Ht 5\' 9"  (1.753 m)   Wt 195 lb (88.5 kg)   SpO2 93%   BMI 28.80 kg/m     Wt Readings from Last 3 Encounters:  02/28/21 195 lb (88.5 kg)  12/21/20 196 lb (88.9 kg)  11/08/20 195 lb (88.5 kg)     GEN:  Well nourished, well developed in no acute distress HEENT: Normal NECK: No JVD; No carotid bruits LYMPHATICS: No lymphadenopathy CARDIAC: RRR, no murmurs, no rubs, no gallops RESPIRATORY:  Clear to auscultation without rales, wheezing or rhonchi  ABDOMEN: Soft, non-tender, non-distended MUSCULOSKELETAL:  No edema; No deformity  SKIN: Warm and dry LOWER EXTREMITIES: no swelling NEUROLOGIC:  Alert and oriented x 3 PSYCHIATRIC:  Normal  affect   ASSESSMENT:    1. Paroxysmal atrial fibrillation (HCC)   2. Essential hypertension   3. Diabetes mellitus without complication ([place])   4. Mixed hyperlipidemia    PLAN:    In order of problems listed above:  1. Paroxysmal atrial fibrillation, maintaining sinus rhythm his heart rate today is 59.  We will continue her milligrams of similar as well as anticoagulation. 2. Essential hypertension blood pressure well controlled we will continue present management. 3. Type 2 diabetes followed by internal medicine team, I did review his K PN which show me his hemoglobin A1c of 7.2 this is from 10 March.  That being followed by internal medicine team. 4. Dyslipidemia I did  review his fasting lipid profile from K PN show LDL 59 HDL 33 we will continue present management.   Medication Adjustments/Labs and Tests Ordered: Current medicines are reviewed at length with the patient today.  Concerns regarding medicines are outlined above.  No orders of the defined types were placed in this encounter.  Medication changes: No orders of the defined types were placed in this encounter.   Signed, Park Liter, MD, [facility] 02/28/2021 9:28 AM    [place]

## 2021-03-06 ENCOUNTER — Telehealth: Payer: Self-pay | Admitting: Cardiology

## 2021-03-06 NOTE — Telephone Encounter (Signed)
STAT if HR is under 50 or over 120 (normal HR is 60-100 beats per minute)  1) What is your heart rate? 79 and high as 111  2) Do you have a log of your heart rate readings (document readings)? Yes high 111 and low as 79  3) Do you have any other symptoms? Yesterday patient could walk to goo today HR 105, 115, 112 it is all over the place patient wife states he is in AFIB.

## 2021-03-06 NOTE — Telephone Encounter (Signed)
Called and spoke with pt who states that he has checked his heart rate several times today and his heart rate is ranging from 70 to 120. Pt states he has no symptoms and never knows when he is in a fib. Pt states he checked his blood pressure which was good but dif not record. Pt wants to know if you need to adjust his medication.

## 2021-03-08 NOTE — Telephone Encounter (Signed)
Called patient informed him to let us know if his heart rate continues to move around like it was and we will do ekg he understood. He states it has calmed down now. He will let us know if it gets worse. No further questions.

## 2021-03-21 NOTE — Progress Notes (Signed)
Follow-up Visit   Date: 03/22/21   Jeremiah [facility]. MRN: [id number] DOB: 09/25/1946   Interim History: Jeremiah Rastetter. is a 75 y.o. male with atrial  fibrillation, hypertension, hyperlipidemia, hypothyroidism, and diabetes mellitus on insulin pump returning to the clinic for follow-up of diabetic neuropathy and worsening gait.  The patient was accompanied to the clinic by wife who also provides collateral information.    History of present illness: He has been diabetic for the past 20 years and developed neuropathy in the feet about 5-10 years ago.  His neuropathy consistent of numbness and tingling involving the toes and soles of the feet, it does not extend above the ankles.  He denies weakness.  He has some imbalance and suffered three falls this year.  He walks unassisted.  The tingling is painful and he takes gabapentin 200mg  TID, previously on 300mg  TID but recently reduced due to renal insuffiencey while hospitalized.  His most recent labs from 12/17 show normal renal function. He was admitted for acute cholecystitis complicated by atrial fibrillation and deconditioning. He is getting home PT now. He denies numbness/tingling in the hands.  No history of alcohol abuse.   UPDATE 03/22/2021:  He is here for follow-up appointment.  At his last visit, the diagnosis of diabetic neuropathy was discussed and labs for other causes of neuropathy.  Labs shows folate deficiency and low vitamin B12 (233).  He was referred for balance PT and reports no significant improvement.  Wife says that he has several spells of shuffling gait and moving his legs, and the only way to assist him is by giving him a walker.  This lasts about 1 day and then resolves.  He denies associated back pain, leg pain, lightheadedness, or change in awareness.  He also has a tremor on the left hand. Wife answers most of the questions for patient and directs the visit. Patient appears frustrated at times when she answers for  him.   Medications:  Current Outpatient Medications on File Prior to Visit  Medication Sig Dispense Refill  . amiodarone (PACERONE) 100 MG tablet Take 1 tablet (100 mg total) by mouth daily. (Patient taking differently: Take 200 mg by mouth daily.) 90 tablet 2  . amLODipine (NORVASC) 10 MG tablet Take 1 tablet (10 mg total) by mouth daily. (Patient taking differently: Take 5 mg by mouth daily.) 90 tablet 2  . aspirin EC 81 MG tablet Take 81 mg by mouth daily.    Marland Kitchen atorvastatin (LIPITOR) 40 MG tablet Take 1 tablet by mouth daily.    . cyanocobalamin 100 MCG tablet Take 1,000 mcg by mouth daily.    . digoxin (LANOXIN) 0.125 MG tablet Take 1 tablet by mouth every other day.    . ezetimibe (ZETIA) 10 MG tablet Take 1 tablet (10 mg total) by mouth daily. 90 tablet 3  . folic acid (FOLVITE) 1 MG tablet Take 1 tablet by mouth daily.    . furosemide (LASIX) 20 MG tablet Take 20 mg by mouth daily.    Marland Kitchen gabapentin (NEURONTIN) 300 MG capsule Take 1 capsule (300 mg total) by mouth 3 (three) times daily. (Patient taking differently: Take 300 mg by mouth 2 (two) times daily.) 270 capsule 3  . hydrALAZINE (APRESOLINE) 100 MG tablet Take 100 mg by mouth 3 (three) times daily.    Marland Kitchen levothyroxine (SYNTHROID, LEVOTHROID) 75 MCG tablet Take 1 tablet by mouth daily.    Marland Kitchen NOVOLOG 100 UNIT/ML injection Inject 14 Units into the  skin 3 (three) times daily with meals.    Marland Kitchen omeprazole (PRILOSEC) 40 MG capsule Take 40 mg by mouth daily after supper.    . Probiotic Product (FLORA-Q PO) Take 1 tablet by mouth 2 (two) times daily. Unknown strength    . rivaroxaban (XARELTO) 20 MG TABS tablet Take 1 tablet by mouth daily.    . simvastatin (ZOCOR) 80 MG tablet Take 80 mg by mouth. 80 mg orally every morning     No current facility-administered medications on file prior to visit.    Allergies:  Allergies  Allergen Reactions  . Methaqualone Other (See Comments)    unknown  . Quinapril     Vital Signs:  BP 138/64    Pulse 80   Ht 5\' 9"  (1.753 m)   Wt 193 lb (87.5 kg)   SpO2 97%   BMI 28.50 kg/m    Neurological Exam: MENTAL STATUS including orientation to time, place, person, recent and remote memory, attention span and concentration, language, and fund of knowledge is normal.  Speech is not dysarthric. Paucity of speech.  Blunted affect.   CRANIAL NERVES:  No visual field defects.  Pupils equal round and reactive to light.  Normal conjugate, extra-ocular eye movements in all directions of gaze, except mildly restricted upgaze bilaterally.  No ptosis.  Face is symmetric. Palate elevates symmetrically.  MOTOR:  Motor strength is 5/5 in all extremities.  Mild resting and action hand tremor on the left.  No pronator drift.  Tone is normal.    MSRs:  Reflexes are 2+/4 in the arms, 1+/4 at the knees, and trace at the ankles.  SENSORY:  Vibration reduced to 50% below the ankles, pin prick and temperature reduced over the feet  COORDINATION/GAIT:  Normal finger-to- nose-finger.  Finger and heel tapping slowed on the right. Gait mildly wide-based, stable, unassisted.   Data: No results found for: HGBA1C Lab Results  Component Value Date   TSH 2.95 11/08/2020   Lab Results  Component Value Date   VITAMINB12 233 11/08/2020   Lab Results  Component Value Date   FOLATE 5.2 (L) 11/08/2020     IMPRESSION/PLAN: 1. Gait difficulty, intermittent shuffling gait in the setting of mild left hand tremor and bradykinesia warrants testing for parkinson's disease.  Symptoms are not classic presentation for PD.    - No significant improvement with PT  - Check DaT scan.  Patient is very reluctant to do additional testing, but patient's wife is very insistent and was able to convince him to agree to testing  - Fall precautions stresed, especially with him being on anticoagulation therapy  2. Diabetic polyneuropathy affecting the feet, longstanding, stable.  - Continue gabapentin 300mg  three times daily  -  Check SPEP with IFE  3. Folate deficiency  - Check folate  4. Low vitamin B12  - Check vitamin B12  Return to clinic in 6 months.   Thank you for allowing me to participate in patient's care.  If I can answer any additional questions, I would be pleased to do so.    Sincerely,    Graydon Fofana K. Posey Pronto, DO

## 2021-03-22 ENCOUNTER — Other Ambulatory Visit (INDEPENDENT_AMBULATORY_CARE_PROVIDER_SITE_OTHER): Payer: Medicare Other

## 2021-03-22 ENCOUNTER — Other Ambulatory Visit: Payer: Self-pay

## 2021-03-22 ENCOUNTER — Ambulatory Visit (INDEPENDENT_AMBULATORY_CARE_PROVIDER_SITE_OTHER): Payer: Medicare Other | Admitting: Neurology

## 2021-03-22 ENCOUNTER — Encounter: Payer: Self-pay | Admitting: Neurology

## 2021-03-22 VITALS — BP 138/64 | HR 80 | Ht 69.0 in | Wt 193.0 lb

## 2021-03-22 DIAGNOSIS — R202 Paresthesia of skin: Secondary | ICD-10-CM

## 2021-03-22 DIAGNOSIS — R251 Tremor, unspecified: Secondary | ICD-10-CM

## 2021-03-22 DIAGNOSIS — E538 Deficiency of other specified B group vitamins: Secondary | ICD-10-CM | POA: Diagnosis not present

## 2021-03-22 DIAGNOSIS — R2681 Unsteadiness on feet: Secondary | ICD-10-CM

## 2021-03-22 DIAGNOSIS — E1342 Other specified diabetes mellitus with diabetic polyneuropathy: Secondary | ICD-10-CM

## 2021-03-22 LAB — B12 AND FOLATE PANEL
Folate: >23.6 ng/mL (ref >5.9)
Vitamin B-12: 1237 pg/mL — ABNORMAL HIGH (ref 211–911)

## 2021-03-22 NOTE — Addendum Note (Signed)
Addended by: Venetia Night on: 03/22/2021 09:03 AM   Modules accepted: Orders

## 2021-03-22 NOTE — Patient Instructions (Signed)
We will order DaT scan and labs and call you with the results.   Take extra care when walking on uneven ground  Return to clinic in 6 months

## 2021-03-23 ENCOUNTER — Telehealth: Payer: Self-pay | Admitting: Neurology

## 2021-03-23 NOTE — Telephone Encounter (Signed)
Patient called and said he does not want to proceed with DAT scan testing at this time.   He said he will discuss it further at his next visit.

## 2021-03-23 NOTE — Telephone Encounter (Signed)
Noted  

## 2021-03-27 LAB — PROTEIN ELECTROPHORESIS, SERUM
Albumin ELP: 4 g/dL (ref 3.8–4.8)
Alpha 1: 0.4 g/dL — ABNORMAL HIGH (ref 0.2–0.3)
Alpha 2: 0.9 g/dL (ref 0.5–0.9)
Beta 2: 0.3 g/dL (ref 0.2–0.5)
Beta Globulin: 0.3 g/dL — ABNORMAL LOW (ref 0.4–0.6)
Gamma Globulin: 1.7 g/dL (ref 0.8–1.7)
Total Protein: 7.6 g/dL (ref 6.1–8.1)

## 2021-03-27 LAB — IMMUNOFIXATION ELECTROPHORESIS
IgG (Immunoglobin G), Serum: 1356 mg/dL (ref 600–1540)
IgM, Serum: 1229 mg/dL — ABNORMAL HIGH (ref 50–300)
Immunofix Electr Int: NOT DETECTED
Immunoglobulin A: 178 mg/dL (ref 70–320)

## 2021-04-02 ENCOUNTER — Telehealth: Payer: Self-pay | Admitting: Neurology

## 2021-04-02 ENCOUNTER — Other Ambulatory Visit: Payer: Self-pay | Admitting: Cardiology

## 2021-04-02 NOTE — Telephone Encounter (Signed)
Patient called in stating he wants to have the DAT scan and would like for [place] to set it up so it can be scheduled.

## 2021-04-02 NOTE — Telephone Encounter (Signed)
Refill sent to pharmacy.   

## 2021-04-03 DIAGNOSIS — M1711 Unilateral primary osteoarthritis, right knee: Secondary | ICD-10-CM | POA: Diagnosis not present

## 2021-04-03 NOTE — Telephone Encounter (Signed)
Please advise 

## 2021-04-03 NOTE — Telephone Encounter (Signed)
Order is already in epic (patient has been going back-and-forth whether to have testing or not).  This will need to be scheduled.

## 2021-04-04 NOTE — Telephone Encounter (Signed)
Notified pt DAT scan been approved by insurance and should get a call from the hospital ([place]) the scheduler.

## 2021-04-18 ENCOUNTER — Inpatient Hospital Stay (HOSPITAL_COMMUNITY): Admission: RE | Admit: 2021-04-18 | Payer: Medicare Other | Source: Ambulatory Visit

## 2021-04-18 ENCOUNTER — Telehealth: Payer: Self-pay | Admitting: Emergency Medicine

## 2021-04-18 ENCOUNTER — Other Ambulatory Visit (HOSPITAL_COMMUNITY): Payer: Medicare Other

## 2021-04-18 NOTE — Telephone Encounter (Signed)
Called patient informed him that his form for his dentist is ready for him to pick up in [place] office. He understood no further questions.

## 2021-04-18 NOTE — Telephone Encounter (Signed)
Kayla with [facility] is following up to provide their fax number incase it is needed:  Fax #: [phone] Phone #: [phone]

## 2021-04-19 DIAGNOSIS — H2513 Age-related nuclear cataract, bilateral: Secondary | ICD-10-CM | POA: Diagnosis not present

## 2021-04-19 DIAGNOSIS — Z794 Long term (current) use of insulin: Secondary | ICD-10-CM | POA: Diagnosis not present

## 2021-04-19 DIAGNOSIS — E113293 Type 2 diabetes mellitus with mild nonproliferative diabetic retinopathy without macular edema, bilateral: Secondary | ICD-10-CM | POA: Diagnosis not present

## 2021-04-19 DIAGNOSIS — E119 Type 2 diabetes mellitus without complications: Secondary | ICD-10-CM | POA: Diagnosis not present

## 2021-04-23 ENCOUNTER — Telehealth: Payer: Self-pay

## 2021-04-23 NOTE — Telephone Encounter (Signed)
Patient with diagnosis of afib on Xarelto for anticoagulation.    Procedure: Remove all upper teeth , place four bone grafts, and four dental implants   Date of procedure: TBD  CHA2DS2-VASc Score = 3  This indicates a 3.2% annual risk of stroke. The patient's score is based upon: CHF History: No HTN History: Yes Diabetes History: Yes Stroke History: No Vascular Disease History: No Age Score: 1 Gender Score: 0  CrCl 75mL/min  Patient does not require pre-op antibiotics for dental procedure.  Per office protocol, patient can hold Xarelto for 2-3 days prior to procedure.

## 2021-04-23 NOTE — Telephone Encounter (Signed)
   [facility] Pre-operative Risk Assessment    Request for surgical clearance:  1. What type of surgery is being performed? Remove all upper teeth , place four bone grafts, and four dental implants   2. When is this surgery scheduled? TBD   3. What type of clearance is required (medical clearance vs. Pharmacy clearance to hold med vs. Both)? Both  4. Are there any medications that need to be held prior to surgery and how long?Xarelto(holding length not specified. Also requesting if the patient needs to be treated with ABX prophylaxis.   5. Practice name and name of physician performing surgery? Dr. Allyson Sabal at [facility]   6. What is your office phone [phone]   7. What is your office fax number: [phone]  8.   Anesthesia type (None, local, MAC, general) ? Not specified   Basil Dess Marwa Fuhrman 04/23/2021, 2:38 PM  _________________________________________________________________   (provider comments below)  ---

## 2021-04-24 NOTE — Telephone Encounter (Signed)
    Jeremiah Beasley. DOB:  03-25-1946  MRN:  [id number]   Primary Cardiologist: [name], MD  Chart reviewed as part of pre-operative protocol coverage. Given past medical history and time since last visit, based on ACC/AHA guidelines, Jeremiah Beasley. would be at acceptable risk for the planned procedure without further cardiovascular testing.   Patient with diagnosis of afib on Xarelto for anticoagulation.    Procedure: Remove all upper teeth , place four bone grafts, and four dental implants  Date of procedure: TBD  CHA2DS2-VASc Score = 3  This indicates a 3.2% annual risk of stroke. The patient's score is based upon: CHF History: No HTN History: Yes Diabetes History: Yes Stroke History: No Vascular Disease History: No Age Score: 1 Gender Score: 0  CrCl 21mL/min  Patient does not require pre-op antibiotics for dental procedure.  Per office protocol, patient can hold Xarelto for 2-3 days prior to procedure.    The patient was advised that if he develops new symptoms prior to surgery to contact our office to arrange for a follow-up visit, and he verbalized understanding.  I will route this recommendation to the requesting party via Epic fax function and remove from pre-op pool.  Please call with questions.  Kathyrn Drown, NP 04/24/2021, 9:28 AM

## 2021-04-26 ENCOUNTER — Encounter (HOSPITAL_COMMUNITY)
Admission: RE | Admit: 2021-04-26 | Discharge: 2021-04-26 | Disposition: A | Discharge status: Home / Self Care | Payer: Medicare Other | Source: Ambulatory Visit | Attending: Neurology | Admitting: Neurology

## 2021-04-26 ENCOUNTER — Other Ambulatory Visit: Payer: Self-pay

## 2021-04-26 DIAGNOSIS — D649 Anemia, unspecified: Secondary | ICD-10-CM | POA: Diagnosis not present

## 2021-04-26 DIAGNOSIS — E1342 Other specified diabetes mellitus with diabetic polyneuropathy: Secondary | ICD-10-CM

## 2021-04-26 DIAGNOSIS — R0689 Other abnormalities of breathing: Secondary | ICD-10-CM | POA: Diagnosis not present

## 2021-04-26 DIAGNOSIS — I4891 Unspecified atrial fibrillation: Secondary | ICD-10-CM | POA: Diagnosis not present

## 2021-04-26 DIAGNOSIS — R0902 Hypoxemia: Secondary | ICD-10-CM | POA: Diagnosis not present

## 2021-04-26 DIAGNOSIS — R410 Disorientation, unspecified: Secondary | ICD-10-CM | POA: Diagnosis not present

## 2021-04-26 DIAGNOSIS — R2681 Unsteadiness on feet: Secondary | ICD-10-CM | POA: Diagnosis not present

## 2021-04-26 DIAGNOSIS — Z20822 Contact with and (suspected) exposure to covid-19: Secondary | ICD-10-CM | POA: Diagnosis not present

## 2021-04-26 DIAGNOSIS — I10 Essential (primary) hypertension: Secondary | ICD-10-CM | POA: Diagnosis not present

## 2021-04-26 DIAGNOSIS — I110 Hypertensive heart disease with heart failure: Secondary | ICD-10-CM | POA: Diagnosis not present

## 2021-04-26 DIAGNOSIS — G20 Parkinson's disease: Secondary | ICD-10-CM | POA: Diagnosis not present

## 2021-04-26 DIAGNOSIS — R7989 Other specified abnormal findings of blood chemistry: Secondary | ICD-10-CM

## 2021-04-26 DIAGNOSIS — R509 Fever, unspecified: Secondary | ICD-10-CM | POA: Diagnosis not present

## 2021-04-26 DIAGNOSIS — I5032 Chronic diastolic (congestive) heart failure: Secondary | ICD-10-CM | POA: Diagnosis not present

## 2021-04-26 DIAGNOSIS — R269 Unspecified abnormalities of gait and mobility: Secondary | ICD-10-CM | POA: Diagnosis not present

## 2021-04-26 DIAGNOSIS — R251 Tremor, unspecified: Secondary | ICD-10-CM

## 2021-04-26 DIAGNOSIS — E538 Deficiency of other specified B group vitamins: Secondary | ICD-10-CM

## 2021-04-26 DIAGNOSIS — R531 Weakness: Secondary | ICD-10-CM | POA: Diagnosis not present

## 2021-04-26 DIAGNOSIS — R4182 Altered mental status, unspecified: Secondary | ICD-10-CM | POA: Diagnosis not present

## 2021-04-26 DIAGNOSIS — E7800 Pure hypercholesterolemia, unspecified: Secondary | ICD-10-CM | POA: Diagnosis not present

## 2021-04-26 MED ORDER — POTASSIUM IODIDE (ANTIDOTE) 130 MG PO TABS: 130.0000 mg | ORAL_TABLET | Freq: Once | ORAL | Status: DC

## 2021-04-26 MED ORDER — POTASSIUM IODIDE (ANTIDOTE) 130 MG PO TABS
ORAL_TABLET | ORAL | Status: AC
  Filled 2021-04-26: qty 1

## 2021-04-26 MED ORDER — IOFLUPANE I 123 185 MBQ/2.5ML IV SOLN
4.9000 | Freq: Once | INTRAVENOUS | Status: AC | PRN
  Administered 2021-04-26: 4.9 via INTRAVENOUS
  Filled 2021-04-26: qty 5

## 2021-04-27 ENCOUNTER — Telehealth: Payer: Self-pay | Admitting: Neurology

## 2021-04-27 NOTE — Telephone Encounter (Signed)
Please inform pt that his DAT scan would not cause fever.  The results of his scan are normal - no signs of Parkinson's disease.

## 2021-04-27 NOTE — Telephone Encounter (Signed)
Notified pt/wife  with DAT scan results and the DAT scan would not cause a fever per Dr. Posey Pronto. Pt wife stated--pt had 105 degree fever after the testing but doing much better.

## 2021-04-30 DIAGNOSIS — I4891 Unspecified atrial fibrillation: Secondary | ICD-10-CM | POA: Diagnosis not present

## 2021-04-30 DIAGNOSIS — Z1331 Encounter for screening for depression: Secondary | ICD-10-CM | POA: Diagnosis not present

## 2021-04-30 DIAGNOSIS — Z1339 Encounter for screening examination for other mental health and behavioral disorders: Secondary | ICD-10-CM | POA: Diagnosis not present

## 2021-04-30 DIAGNOSIS — E7800 Pure hypercholesterolemia, unspecified: Secondary | ICD-10-CM | POA: Diagnosis not present

## 2021-04-30 DIAGNOSIS — I10 Essential (primary) hypertension: Secondary | ICD-10-CM | POA: Diagnosis not present

## 2021-04-30 DIAGNOSIS — N400 Enlarged prostate without lower urinary tract symptoms: Secondary | ICD-10-CM | POA: Diagnosis not present

## 2021-04-30 DIAGNOSIS — E1021 Type 1 diabetes mellitus with diabetic nephropathy: Secondary | ICD-10-CM | POA: Diagnosis not present

## 2021-04-30 DIAGNOSIS — E1040 Type 1 diabetes mellitus with diabetic neuropathy, unspecified: Secondary | ICD-10-CM | POA: Diagnosis not present

## 2021-04-30 DIAGNOSIS — N1831 Chronic kidney disease, stage 3a: Secondary | ICD-10-CM | POA: Diagnosis not present

## 2021-04-30 DIAGNOSIS — Z6828 Body mass index (BMI) 28.0-28.9, adult: Secondary | ICD-10-CM | POA: Diagnosis not present

## 2021-04-30 DIAGNOSIS — E10319 Type 1 diabetes mellitus with unspecified diabetic retinopathy without macular edema: Secondary | ICD-10-CM | POA: Diagnosis not present

## 2021-04-30 DIAGNOSIS — I5032 Chronic diastolic (congestive) heart failure: Secondary | ICD-10-CM | POA: Diagnosis not present

## 2021-04-30 DIAGNOSIS — Z0000 Encounter for general adult medical examination without abnormal findings: Secondary | ICD-10-CM | POA: Diagnosis not present

## 2021-04-30 DIAGNOSIS — E039 Hypothyroidism, unspecified: Secondary | ICD-10-CM | POA: Diagnosis not present

## 2021-05-01 DIAGNOSIS — M1711 Unilateral primary osteoarthritis, right knee: Secondary | ICD-10-CM | POA: Diagnosis not present

## 2021-05-02 DIAGNOSIS — R2689 Other abnormalities of gait and mobility: Secondary | ICD-10-CM | POA: Diagnosis not present

## 2021-05-02 DIAGNOSIS — M62551 Muscle wasting and atrophy, not elsewhere classified, right thigh: Secondary | ICD-10-CM | POA: Diagnosis not present

## 2021-05-03 DIAGNOSIS — E113411 Type 2 diabetes mellitus with severe nonproliferative diabetic retinopathy with macular edema, right eye: Secondary | ICD-10-CM | POA: Diagnosis not present

## 2021-05-03 DIAGNOSIS — H2513 Age-related nuclear cataract, bilateral: Secondary | ICD-10-CM | POA: Diagnosis not present

## 2021-05-03 DIAGNOSIS — E113412 Type 2 diabetes mellitus with severe nonproliferative diabetic retinopathy with macular edema, left eye: Secondary | ICD-10-CM | POA: Diagnosis not present

## 2021-05-07 DIAGNOSIS — R2689 Other abnormalities of gait and mobility: Secondary | ICD-10-CM | POA: Diagnosis not present

## 2021-05-07 DIAGNOSIS — M62551 Muscle wasting and atrophy, not elsewhere classified, right thigh: Secondary | ICD-10-CM | POA: Diagnosis not present

## 2021-05-08 DIAGNOSIS — D649 Anemia, unspecified: Secondary | ICD-10-CM | POA: Diagnosis not present

## 2021-05-09 DIAGNOSIS — E1040 Type 1 diabetes mellitus with diabetic neuropathy, unspecified: Secondary | ICD-10-CM | POA: Diagnosis not present

## 2021-05-09 DIAGNOSIS — E1065 Type 1 diabetes mellitus with hyperglycemia: Secondary | ICD-10-CM | POA: Diagnosis not present

## 2021-05-09 DIAGNOSIS — E10649 Type 1 diabetes mellitus with hypoglycemia without coma: Secondary | ICD-10-CM | POA: Diagnosis not present

## 2021-05-10 ENCOUNTER — Ambulatory Visit: Payer: Medicare Other | Admitting: Neurology

## 2021-05-10 DIAGNOSIS — R2689 Other abnormalities of gait and mobility: Secondary | ICD-10-CM | POA: Diagnosis not present

## 2021-05-10 DIAGNOSIS — M62551 Muscle wasting and atrophy, not elsewhere classified, right thigh: Secondary | ICD-10-CM | POA: Diagnosis not present

## 2021-05-11 DIAGNOSIS — Z1211 Encounter for screening for malignant neoplasm of colon: Secondary | ICD-10-CM | POA: Diagnosis not present

## 2021-05-14 DIAGNOSIS — R2689 Other abnormalities of gait and mobility: Secondary | ICD-10-CM | POA: Diagnosis not present

## 2021-05-14 DIAGNOSIS — M62551 Muscle wasting and atrophy, not elsewhere classified, right thigh: Secondary | ICD-10-CM | POA: Diagnosis not present

## 2021-05-18 HISTORY — PX: OTHER SURGICAL HISTORY: SHX169

## 2021-05-22 DIAGNOSIS — R2689 Other abnormalities of gait and mobility: Secondary | ICD-10-CM | POA: Diagnosis not present

## 2021-05-22 DIAGNOSIS — M62551 Muscle wasting and atrophy, not elsewhere classified, right thigh: Secondary | ICD-10-CM | POA: Diagnosis not present

## 2021-05-25 DIAGNOSIS — M62551 Muscle wasting and atrophy, not elsewhere classified, right thigh: Secondary | ICD-10-CM | POA: Diagnosis not present

## 2021-05-25 DIAGNOSIS — R2689 Other abnormalities of gait and mobility: Secondary | ICD-10-CM | POA: Diagnosis not present

## 2021-05-29 DIAGNOSIS — M62551 Muscle wasting and atrophy, not elsewhere classified, right thigh: Secondary | ICD-10-CM | POA: Diagnosis not present

## 2021-05-29 DIAGNOSIS — R2689 Other abnormalities of gait and mobility: Secondary | ICD-10-CM | POA: Diagnosis not present

## 2021-05-30 DIAGNOSIS — D509 Iron deficiency anemia, unspecified: Secondary | ICD-10-CM | POA: Diagnosis not present

## 2021-06-05 DIAGNOSIS — R2689 Other abnormalities of gait and mobility: Secondary | ICD-10-CM | POA: Diagnosis not present

## 2021-06-05 DIAGNOSIS — M62551 Muscle wasting and atrophy, not elsewhere classified, right thigh: Secondary | ICD-10-CM | POA: Diagnosis not present

## 2021-06-06 DIAGNOSIS — D509 Iron deficiency anemia, unspecified: Secondary | ICD-10-CM | POA: Diagnosis not present

## 2021-06-07 DIAGNOSIS — R2689 Other abnormalities of gait and mobility: Secondary | ICD-10-CM | POA: Diagnosis not present

## 2021-06-07 DIAGNOSIS — M62551 Muscle wasting and atrophy, not elsewhere classified, right thigh: Secondary | ICD-10-CM | POA: Diagnosis not present

## 2021-06-30 DIAGNOSIS — T1490XA Injury, unspecified, initial encounter: Secondary | ICD-10-CM | POA: Diagnosis not present

## 2021-06-30 DIAGNOSIS — L03115 Cellulitis of right lower limb: Secondary | ICD-10-CM | POA: Diagnosis not present

## 2021-07-11 DIAGNOSIS — N289 Disorder of kidney and ureter, unspecified: Secondary | ICD-10-CM | POA: Diagnosis not present

## 2021-07-11 DIAGNOSIS — K219 Gastro-esophageal reflux disease without esophagitis: Secondary | ICD-10-CM | POA: Diagnosis not present

## 2021-07-11 DIAGNOSIS — K649 Unspecified hemorrhoids: Secondary | ICD-10-CM | POA: Diagnosis not present

## 2021-07-11 DIAGNOSIS — D509 Iron deficiency anemia, unspecified: Secondary | ICD-10-CM | POA: Diagnosis not present

## 2021-07-12 ENCOUNTER — Telehealth: Payer: Self-pay

## 2021-07-12 DIAGNOSIS — D649 Anemia, unspecified: Secondary | ICD-10-CM | POA: Diagnosis not present

## 2021-07-12 NOTE — Telephone Encounter (Signed)
   [facility] Pre-operative Risk Assessment    Request for surgical clearance:  What type of surgery is being performed? EGD   When is this surgery scheduled? 08/07/2021   What type of clearance is required (medical clearance vs. Pharmacy clearance to hold med vs. Both)? Both  Are there any medications that need to be held prior to surgery and how long?Xarelto to hold on 08/05/2021   Practice name and name of physician performing surgery? Dr. Kyra Leyland at [facility]   What is your office phone number: [phone]    7.   What is your office fax number: [phone]  8.   Anesthesia type (None, local, MAC, general) Not specified    Basil Dess Phelix Fudala 07/12/2021, 8:08 AM  _________________________________________________________________   (provider comments below)

## 2021-07-13 NOTE — Telephone Encounter (Signed)
    Patient Name: Jeremiah [facility]as[facility].  DOB: February 03, 1946 MRN: [id number]  Primary Cardiologist: [name], MD  Chart reviewed as part of pre-operative protocol coverage. Given past medical history and time since last visit, based on ACC/AHA guidelines, [name]. would be at acceptable risk for the planned procedure without further cardiovascular testing.   Patient with diagnosis of afib on Xarelto for anticoagulation.     Procedure: EGD Date of procedure: 08/07/21   CHA2DS2-VASc Score = 3  This indicates a 3.2% annual risk of stroke. The patient's score is based upon: CHF History: No HTN History: Yes Diabetes History: Yes Stroke History: No Vascular Disease History: No Age Score: 1 Gender Score: 0   CrCl 46 ml/min   Per office protocol, patient can hold Xarelto for 2 days prior to procedure.    The patient was advised that if he develops new symptoms prior to surgery to contact our office to arrange for a follow-up visit, and he verbalized understanding.  I will route this recommendation to the requesting party via Epic fax function and remove from pre-op pool.  Please call with questions.  Kathyrn Drown, NP 07/13/2021, 2:13 PM

## 2021-07-13 NOTE — Telephone Encounter (Signed)
Patient with diagnosis of afib on Xarelto for anticoagulation.    Procedure: EGD Date of procedure: 08/07/21  CHA2DS2-VASc Score = 3  This indicates a 3.2% annual risk of stroke. The patient's score is based upon: CHF History: No HTN History: Yes Diabetes History: Yes Stroke History: No Vascular Disease History: No Age Score: 1 Gender Score: 0     CrCl 46 ml/min  Per office protocol, patient can hold Xarelto for 2 days prior to procedure.

## 2021-07-14 DIAGNOSIS — R220 Localized swelling, mass and lump, head: Secondary | ICD-10-CM | POA: Diagnosis not present

## 2021-07-14 DIAGNOSIS — E785 Hyperlipidemia, unspecified: Secondary | ICD-10-CM | POA: Insufficient documentation

## 2021-07-23 DIAGNOSIS — S81801D Unspecified open wound, right lower leg, subsequent encounter: Secondary | ICD-10-CM | POA: Diagnosis not present

## 2021-07-25 DIAGNOSIS — D509 Iron deficiency anemia, unspecified: Secondary | ICD-10-CM | POA: Diagnosis not present

## 2021-07-25 DIAGNOSIS — Z794 Long term (current) use of insulin: Secondary | ICD-10-CM | POA: Diagnosis not present

## 2021-07-25 DIAGNOSIS — N183 Chronic kidney disease, stage 3 unspecified: Secondary | ICD-10-CM | POA: Diagnosis not present

## 2021-07-25 DIAGNOSIS — I482 Chronic atrial fibrillation, unspecified: Secondary | ICD-10-CM | POA: Diagnosis not present

## 2021-07-25 DIAGNOSIS — E1010 Type 1 diabetes mellitus with ketoacidosis without coma: Secondary | ICD-10-CM | POA: Diagnosis not present

## 2021-07-25 DIAGNOSIS — D631 Anemia in chronic kidney disease: Secondary | ICD-10-CM | POA: Diagnosis not present

## 2021-07-25 DIAGNOSIS — E1165 Type 2 diabetes mellitus with hyperglycemia: Secondary | ICD-10-CM | POA: Diagnosis not present

## 2021-07-25 DIAGNOSIS — L02415 Cutaneous abscess of right lower limb: Secondary | ICD-10-CM | POA: Diagnosis not present

## 2021-07-25 DIAGNOSIS — E1022 Type 1 diabetes mellitus with diabetic chronic kidney disease: Secondary | ICD-10-CM | POA: Diagnosis not present

## 2021-07-25 DIAGNOSIS — L03115 Cellulitis of right lower limb: Secondary | ICD-10-CM | POA: Diagnosis not present

## 2021-07-25 DIAGNOSIS — B9562 Methicillin resistant Staphylococcus aureus infection as the cause of diseases classified elsewhere: Secondary | ICD-10-CM | POA: Diagnosis not present

## 2021-07-25 DIAGNOSIS — I129 Hypertensive chronic kidney disease with stage 1 through stage 4 chronic kidney disease, or unspecified chronic kidney disease: Secondary | ICD-10-CM | POA: Diagnosis not present

## 2021-07-26 ENCOUNTER — Telehealth: Payer: Self-pay

## 2021-07-26 DIAGNOSIS — D509 Iron deficiency anemia, unspecified: Secondary | ICD-10-CM | POA: Diagnosis not present

## 2021-07-26 DIAGNOSIS — L02415 Cutaneous abscess of right lower limb: Secondary | ICD-10-CM | POA: Diagnosis not present

## 2021-07-26 DIAGNOSIS — D631 Anemia in chronic kidney disease: Secondary | ICD-10-CM | POA: Diagnosis not present

## 2021-07-26 DIAGNOSIS — L03115 Cellulitis of right lower limb: Secondary | ICD-10-CM | POA: Diagnosis not present

## 2021-07-26 DIAGNOSIS — B9562 Methicillin resistant Staphylococcus aureus infection as the cause of diseases classified elsewhere: Secondary | ICD-10-CM | POA: Diagnosis not present

## 2021-07-26 DIAGNOSIS — N183 Chronic kidney disease, stage 3 unspecified: Secondary | ICD-10-CM | POA: Diagnosis not present

## 2021-07-26 DIAGNOSIS — E1010 Type 1 diabetes mellitus with ketoacidosis without coma: Secondary | ICD-10-CM | POA: Diagnosis not present

## 2021-07-26 DIAGNOSIS — E1022 Type 1 diabetes mellitus with diabetic chronic kidney disease: Secondary | ICD-10-CM | POA: Diagnosis not present

## 2021-07-26 DIAGNOSIS — I482 Chronic atrial fibrillation, unspecified: Secondary | ICD-10-CM | POA: Diagnosis not present

## 2021-07-26 DIAGNOSIS — Z794 Long term (current) use of insulin: Secondary | ICD-10-CM | POA: Diagnosis not present

## 2021-07-26 DIAGNOSIS — I129 Hypertensive chronic kidney disease with stage 1 through stage 4 chronic kidney disease, or unspecified chronic kidney disease: Secondary | ICD-10-CM | POA: Diagnosis not present

## 2021-07-26 NOTE — Telephone Encounter (Deleted)
   [facility] Pre-operative Risk Assessment    Patient Name: Jeremiah Beas[facility].  DOB: 02/15/1946 MRN: [id number]  HEARTCARE STAFF:  - IMPORTANT!!!!!! Under Visit Info/Reason for Call, type in Other and utilize the format Clearance MM/DD/YY or Clearance TBD. Do not use dashes or single digits. - Please review there is not already an duplicate clearance open for this procedure. - If request is for dental extraction, please clarify the # of teeth to be extracted. - If the patient is currently at the dentist's office, call Pre-Op Callback Staff (MA/nurse) to input urgent request.  - If the patient is not currently in the dentist office, please route to the Pre-Op pool.  Request for surgical clearance:  What type of surgery is being performed? EGD/ Dilation ASA  When is this surgery scheduled? TBD  What type of clearance is required (medical clearance vs. Pharmacy clearance to hold med vs. Both)? Both   Are there any medications that need to be held prior to surgery and how long? Xarelto   Practice name and name of physician performing surgery? [facility], Dr. Hurshel Keys   What is the office phone number? [phone]   7.   What is the office fax number? [phone]  8.   Anesthesia type (None, local, MAC, general) ? Propofol    [name] 07/26/2021, 2:12 PM  _________________________________________________________________   (provider comments below)

## 2021-07-26 NOTE — Telephone Encounter (Signed)
I re-faxed Jeremiah Beasley's clearance letter. Can you please make sure they received it?

## 2021-07-26 NOTE — Telephone Encounter (Signed)
Attempted to reach requesting office to make sure they received request. Voicemail said their office is closed for the day.

## 2021-07-27 NOTE — Telephone Encounter (Signed)
Tried to call to confirm if the surgeon's office received the clearance, office is closed on Fridays.

## 2021-07-28 DIAGNOSIS — Z4800 Encounter for change or removal of nonsurgical wound dressing: Secondary | ICD-10-CM | POA: Diagnosis not present

## 2021-07-30 NOTE — Telephone Encounter (Signed)
Spoke with Cecille Rubin @ [facility], she states that they haven't went through their faxes as of yet, that they have been extremely busy.  Advised that she will call us back if they didn't receive it, that she will call us back.

## 2021-08-02 DIAGNOSIS — L0390 Cellulitis, unspecified: Secondary | ICD-10-CM | POA: Diagnosis not present

## 2021-08-02 DIAGNOSIS — R3911 Hesitancy of micturition: Secondary | ICD-10-CM | POA: Diagnosis not present

## 2021-08-02 DIAGNOSIS — D649 Anemia, unspecified: Secondary | ICD-10-CM | POA: Diagnosis not present

## 2021-08-02 DIAGNOSIS — E1021 Type 1 diabetes mellitus with diabetic nephropathy: Secondary | ICD-10-CM | POA: Diagnosis not present

## 2021-08-02 DIAGNOSIS — E1140 Type 2 diabetes mellitus with diabetic neuropathy, unspecified: Secondary | ICD-10-CM | POA: Diagnosis not present

## 2021-08-02 DIAGNOSIS — E1040 Type 1 diabetes mellitus with diabetic neuropathy, unspecified: Secondary | ICD-10-CM | POA: Diagnosis not present

## 2021-08-02 DIAGNOSIS — N1831 Chronic kidney disease, stage 3a: Secondary | ICD-10-CM | POA: Diagnosis not present

## 2021-08-06 ENCOUNTER — Other Ambulatory Visit: Payer: Self-pay | Admitting: *Deleted

## 2021-08-06 NOTE — Patient Outreach (Signed)
[facility]) Care Management  08/06/2021  Jeremiah Beasley. Feb 04, 1946 [id number]   [facility] outreach to post hospital referred patient  Mr Jeremiah Beasley was referred to [facility] on 07/30/21 after a discharge from [facility] was noted on 08/05/21 and with an ED visit on 07/28/21   Insurance Medicare   Outreach to [place]  Mr Jeremiah [facility] answered and was able to verify HIPAA identifiers   Post hospital initial assessment  Mr Jeremiah Beasley reports he is doing well today He confirms some transition of care assessment He has followed up with his primary care provider (PCP), Dr [name] but continues to report a concern with pain of his right knee He confirms he is not being followed by pain management providers and is not decided when he would allow providers to complete surgery as he reports he does not feel he could tolerate rehab at this time   Falls  He reports a fall about a week ago He has a cane and walker at home but reports he is not always compliant with using them "I guess I am just hard headed" RN CM discussed an option of using a wheelchair to maneuver for safety He states he will consider the idea for safety reasons He has pain medications at home   Atrial fibrillation Denies worsening symptoms  Diabetes (DM) type 2  Confirms elevations in 300s with pain concerns Has monitor at home   Patient Active Problem List   Diagnosis Date Noted   Hyperlipidemia 07/14/2021   Body mass index (BMI) 28.0-28.9, adult 01/10/2021   Chronic pain syndrome 01/10/2021   On anticoagulant therapy 01/10/2021   Primary osteoarthritis of right knee 01/10/2021   Hypertension    Diabetes mellitus without complication [facility])    Atrial fibrillation ([place])    Pneumonia 10/20/2020   S/P right knee arthroscopy 04/25/2020   Chronic pain of right knee 04/11/2020   Essential (primary) hypertension 05/15/2015   Paroxysmal atrial fibrillation ([place]) 05/15/2015   Pure hypercholesterolemia  05/15/2015   Type 2 diabetes mellitus without complication ([place]) 0000000   Past Medical History:  Diagnosis Date   Atrial fibrillation ([place])    Chronic pain of right knee 04/11/2020   Diabetes mellitus without complication ([place])    Essential hypertension 05/15/2015   Hyperlipidemia    Hypertension    Paroxysmal atrial fibrillation ([place]) 05/15/2015   Pneumonia 10/20/2020   Pure hypercholesterolemia 05/15/2015   S/P right knee arthroscopy 04/25/2020   Type 2 diabetes mellitus without complication ([place]) [id number]   Current Outpatient Medications on File Prior to Visit  Medication Sig Dispense Refill   amiodarone (PACERONE) 200 MG tablet 100 mg. 1/2 pill     ezetimibe (ZETIA) 10 MG tablet      HYDROcodone-acetaminophen (NORCO/VICODIN) 5-325 MG tablet Take by mouth.     polyethylene glycol-electrolytes (NULYTELY) 420 g solution 4000 MILLILITER AS DIRECTED FOR BOWEL PREP     rivaroxaban (XARELTO) 20 MG TABS tablet Take by mouth.     amiodarone (PACERONE) 100 MG tablet Take 1 tablet (100 mg total) by mouth daily. (Patient taking differently: Take 200 mg by mouth daily.) 90 tablet 2   amLODipine (NORVASC) 10 MG tablet Take 1 tablet (10 mg total) by mouth daily. (Patient taking differently: Take 5 mg by mouth daily.) 90 tablet 2   amLODipine (NORVASC) 10 MG tablet Take by mouth.     amoxicillin (AMOXIL) 500 MG capsule Take by mouth 3 (three) times daily.     aspirin  81 MG Beasley tablet Take by mouth.     aspirin Beasley 81 MG tablet Take 81 mg by mouth daily.     atorvastatin (LIPITOR) 40 MG tablet Take 1 tablet by mouth daily.     cephALEXin (KEFLEX) 500 MG capsule Take 500 mg by mouth 4 (four) times daily.     chlorhexidine (PERIDEX) 0.12 % solution 15 mLs 2 (two) times daily.     cyanocobalamin 100 MCG tablet Take 1,000 mcg by mouth daily.     digoxin (LANOXIN) 0.125 MG tablet Take 1 tablet by mouth every other day.     doxycycline (VIBRAMYCIN) 100 MG capsule Take 100 mg by mouth 2 (two) times  daily.     ezetimibe (ZETIA) 10 MG tablet Take 1 tablet (10 mg total) by mouth daily. 90 tablet 3   folic acid (FOLVITE) 1 MG tablet Take 1 tablet by mouth daily.     furosemide (LASIX) 20 MG tablet TAKE ONE (1) TABLET BY MOUTH EVERY DAY. 90 tablet 2   gabapentin (NEURONTIN) 300 MG capsule Take 1 capsule (300 mg total) by mouth 3 (three) times daily. (Patient taking differently: Take 300 mg by mouth 2 (two) times daily.) 270 capsule 3   hydrALAZINE (APRESOLINE) 100 MG tablet Take 100 mg by mouth 3 (three) times daily.     HYDROcodone-acetaminophen (NORCO/VICODIN) 5-325 MG tablet Take 1 tablet by mouth every 6 (six) hours as needed.     levothyroxine (SYNTHROID, LEVOTHROID) 75 MCG tablet Take 1 tablet by mouth daily.     NOVOLOG 100 UNIT/ML injection Inject 14 Units into the skin 3 (three) times daily with meals.     NOVOLOG 100 UNIT/ML injection Inject into the skin as directed.     NOVOLOG 100 UNIT/ML injection Inject into the skin as directed.     omeprazole (PRILOSEC) 40 MG capsule Take 40 mg by mouth daily after supper.     penicillin v potassium (VEETID) 500 MG tablet Take 500 mg by mouth 4 (four) times daily.     polyethylene glycol-electrolytes (NULYTELY) 420 g solution Take 4,000 mLs by mouth as directed.     Probiotic Product (FLORA-Q PO) Take 1 tablet by mouth 2 (two) times daily. Unknown strength     rivaroxaban (XARELTO) 20 MG TABS tablet Take 1 tablet by mouth daily.     simvastatin (ZOCOR) 80 MG tablet Take 80 mg by mouth. 80 mg orally every morning     No current facility-administered medications on file prior to visit.    Plans  Patient agrees to care plan and follow up within the next 14  days   Goals Addressed               This Visit's Progress     Patient Stated     Prevent Falls and Injury Jeremiah [facility]) (pt-stated)   Not on track     Follow Up Date 08/22/21    - use a cane or walker or try use of a wheelchair   Notes:  08/06/21 He reports a fall about a week  ago He has a cane and walker at home but reports he is not always compliant with using them "I guess I am just hard headed"        Dhruva Orndoff L. Lavina Hamman, RN, BSN, [place] Coordinator Office number [phone] [facility] number [id number] Fax number [phone]

## 2021-08-09 DIAGNOSIS — E1065 Type 1 diabetes mellitus with hyperglycemia: Secondary | ICD-10-CM | POA: Diagnosis not present

## 2021-08-09 DIAGNOSIS — E1040 Type 1 diabetes mellitus with diabetic neuropathy, unspecified: Secondary | ICD-10-CM | POA: Diagnosis not present

## 2021-08-09 DIAGNOSIS — E10649 Type 1 diabetes mellitus with hypoglycemia without coma: Secondary | ICD-10-CM | POA: Diagnosis not present

## 2021-08-17 DIAGNOSIS — E1140 Type 2 diabetes mellitus with diabetic neuropathy, unspecified: Secondary | ICD-10-CM | POA: Diagnosis not present

## 2021-08-17 DIAGNOSIS — E785 Hyperlipidemia, unspecified: Secondary | ICD-10-CM | POA: Diagnosis not present

## 2021-08-20 DIAGNOSIS — K8689 Other specified diseases of pancreas: Secondary | ICD-10-CM | POA: Diagnosis not present

## 2021-08-20 DIAGNOSIS — M47814 Spondylosis without myelopathy or radiculopathy, thoracic region: Secondary | ICD-10-CM | POA: Diagnosis not present

## 2021-08-20 DIAGNOSIS — M479 Spondylosis, unspecified: Secondary | ICD-10-CM | POA: Diagnosis not present

## 2021-08-20 DIAGNOSIS — R102 Pelvic and perineal pain: Secondary | ICD-10-CM | POA: Diagnosis not present

## 2021-08-20 DIAGNOSIS — I2510 Atherosclerotic heart disease of native coronary artery without angina pectoris: Secondary | ICD-10-CM | POA: Diagnosis not present

## 2021-08-20 DIAGNOSIS — K4090 Unilateral inguinal hernia, without obstruction or gangrene, not specified as recurrent: Secondary | ICD-10-CM | POA: Diagnosis not present

## 2021-08-20 DIAGNOSIS — J984 Other disorders of lung: Secondary | ICD-10-CM | POA: Diagnosis not present

## 2021-08-20 DIAGNOSIS — S22050A Wedge compression fracture of T5-T6 vertebra, initial encounter for closed fracture: Secondary | ICD-10-CM | POA: Diagnosis not present

## 2021-08-20 DIAGNOSIS — R1084 Generalized abdominal pain: Secondary | ICD-10-CM | POA: Diagnosis not present

## 2021-08-20 DIAGNOSIS — R079 Chest pain, unspecified: Secondary | ICD-10-CM | POA: Diagnosis not present

## 2021-08-20 DIAGNOSIS — M47819 Spondylosis without myelopathy or radiculopathy, site unspecified: Secondary | ICD-10-CM | POA: Diagnosis not present

## 2021-08-20 DIAGNOSIS — R109 Unspecified abdominal pain: Secondary | ICD-10-CM | POA: Diagnosis not present

## 2021-08-20 DIAGNOSIS — J9811 Atelectasis: Secondary | ICD-10-CM | POA: Diagnosis not present

## 2021-08-20 DIAGNOSIS — N289 Disorder of kidney and ureter, unspecified: Secondary | ICD-10-CM | POA: Diagnosis not present

## 2021-08-22 ENCOUNTER — Other Ambulatory Visit: Payer: Self-pay | Admitting: *Deleted

## 2021-08-22 ENCOUNTER — Other Ambulatory Visit: Payer: Self-pay

## 2021-08-22 NOTE — Patient Outreach (Signed)
[facility]) Care Management  08/22/2021  Jeremiah Beasley. 1946-02-25 [id number]   [facility] outreach to post hospital referred patient   Jeremiah Beasley was referred to [facility] on 07/30/21 after a discharge from [facility] was noted on 08/05/21 and with an ED visit on 07/28/21    Insurance Medicare     Outreach to [phone]  Jeremiah Beasley was able to verify his HIPAA identifiers  Assessment Diabetes type 1 Recent adjustment to his insulin pump assisted with home management of his cbg values form 300's to 100-150's  He still reports he notes he continues to nap at intervals during the day  Knee pain   Need right knee replacement and not sure if he wants tb  have surgery as he reports being leery of anesthesia after an experience with anesthesia in 02/21/2020 (reports a near death experience)  RN CM discussed conservative treatments He reports he will consider conservative treatments and reports he feels he has not been offered options  He has been previously seen by Dr Ronnie Derby [facility] indicates telephone calls to Dr Ronnie Derby in 2020-02-21 and a canceled surgery in January 2022 related to a ruptured gall bladder  RN CM discussed medicare face to face guidelines with Jeremiah   falls No falls in the last 2 weeks Does not want to discuss him using a wheel chair   Sent EMMI education for conservative chronic knee pain/ treatment and information on the [place] supply store as requested via mail   Plans Patient agrees to care plan and follow up within the next 30 business days  Goals Addressed               This Visit's Progress     Patient Stated     Prevent Falls and Injury [facility]) (pt-stated)   On track     Follow Up Date 08/22/21    - use a cane or walker or try use of a wheelchair    Notes:  08/06/21 He reports a fall about a week ago He has a cane and walker at home but reports he is not always compliant with using them "I guess I am just hard headed"         Solomiya Pascale L. Lavina Hamman, RN, BSN, [place] Coordinator Office number [phone] [address] [facility] number [phone] Fax number [phone]

## 2021-08-24 ENCOUNTER — Ambulatory Visit (INDEPENDENT_AMBULATORY_CARE_PROVIDER_SITE_OTHER): Payer: Medicare Other

## 2021-08-24 ENCOUNTER — Encounter: Payer: Self-pay | Admitting: Cardiology

## 2021-08-24 ENCOUNTER — Other Ambulatory Visit: Payer: Self-pay

## 2021-08-24 ENCOUNTER — Ambulatory Visit (INDEPENDENT_AMBULATORY_CARE_PROVIDER_SITE_OTHER): Payer: Medicare Other | Admitting: Cardiology

## 2021-08-24 VITALS — BP 130/60 | HR 58 | Ht 69.0 in | Wt 185.2 lb

## 2021-08-24 DIAGNOSIS — I480 Paroxysmal atrial fibrillation: Secondary | ICD-10-CM

## 2021-08-24 DIAGNOSIS — E119 Type 2 diabetes mellitus without complications: Secondary | ICD-10-CM | POA: Diagnosis not present

## 2021-08-24 DIAGNOSIS — I10 Essential (primary) hypertension: Secondary | ICD-10-CM | POA: Diagnosis not present

## 2021-08-24 DIAGNOSIS — G894 Chronic pain syndrome: Secondary | ICD-10-CM

## 2021-08-24 NOTE — Patient Instructions (Signed)
Medication Instructions:  Your physician recommends that you continue on your current medications as directed. Please refer to the Current Medication list given to you today.  *If you need a refill on your cardiac medications before your next appointment, please call your pharmacy*   Lab Work: None. If you have labs (blood work) drawn today and your tests are completely normal, you will receive your results only by: . MyChart Message (if you have MyChart) OR . A paper copy in the mail If you have any lab test that is abnormal or we need to change your treatment, we will call you to review the results.   Testing/Procedures: A zio monitor was ordered today. It will remain on for 7 days. You will then return monitor and event diary in provided box. It takes 1-2 weeks for report to be downloaded and returned to us. We will call you with the results. If monitor falls off or has orange flashing light, please call Zio for further instructions.      Follow-Up: At [facility], you and your health needs are our priority.  As part of our continuing mission to provide you with exceptional heart care, we have created designated Provider Care Teams.  These Care Teams include your primary Cardiologist (physician) and Advanced Practice Providers (APPs -  Physician Assistants and Nurse Practitioners) who all work together to provide you with the care you need, when you need it.  We recommend signing up for the patient portal called "MyChart".  Sign up information is provided on this After Visit Summary.  MyChart is used to connect with patients for Virtual Visits (Telemedicine).  Patients are able to view lab/test results, encounter notes, upcoming appointments, etc.  Non-urgent messages can be sent to your provider as well.   To learn more about what you can do with MyChart, go to [email or web address].    Your next appointment:   2 month(s)  The format for your next appointment:   In  Person  Provider:   Kyel Krasowski, MD   Other Instructions   

## 2021-08-24 NOTE — Progress Notes (Signed)
Cardiology Office Note:    Date:  08/24/2021   ID:  Jeremiah Sheriff., DOB December 16, 1945, MRN [id number]  PCP:  [name], MD  Cardiologist:  [name], MD    Referring MD: [name], [name], MD   Chief Complaint  Patient presents with   Chest Pain  Have a pain on the right side of my chest  History of Present Illness:    Jeremiah Beasley. is a 75 y.o. male   with past medical history significant for paroxysmal atrial fibrillation, initially successfully suppressed with amiodarone but then became bradycardic and amiodarone actually was discontinued.  Also have history of diabetes, essential hypertension, dyslipidemia.  He is intolerant to ACE inhibitor because of tongue swelling.  Recently he ended going to the hospital with ruptured gallbladder required surgery and went to atrial fibrillation after surgery required amiodarone again to control his rate his amiodarone right now is her milligrams daily only.  He is doing well denies have any chest pain tightness squeezing pressure mid chest no dizziness no passing out.  He was referred back to [place] because of pain on the side of his body that he have pain is located on the back on the right flank.  Moving his body taking deep breath make it worse.  He had a beat in the emergency room he was found to have kidney dysfunction, he is CT of the chest done without contrast because of kidney dysfunction review no significant pathology.  However his kidney function deteriorated with creatinine of 2.4.  He does have appointment with kidney specialist.  Overall denies have any typical exertional tightness squeezing pressure burning chest but complain of having profound weakness fatigue and tiredness.  Past Medical History:  Diagnosis Date   Atrial fibrillation ([place])    Chronic pain of right knee 04/11/2020   Diabetes mellitus without complication ([place])    Essential hypertension 05/15/2015   Hyperlipidemia    Hypertension    MRSA (methicillin  resistant staph aureus) culture positive    Paroxysmal atrial fibrillation ([place]) 05/15/2015   Pneumonia 10/20/2020   Pure hypercholesterolemia 05/15/2015   S/P right knee arthroscopy 04/25/2020   Type 2 diabetes mellitus without complication ([place]) 02/63/7858    Past Surgical History:  Procedure Laterality Date   gall bladder removed   10/20/2020   Iron infusion  05/18/2021   x2   NO PAST SURGERIES      Current Medications: Current Meds  Medication Sig   amiodarone (PACERONE) 200 MG tablet Take 100 mg by mouth daily.   amLODipine (NORVASC) 10 MG tablet Take 1 tablet (10 mg total) by mouth daily. (Patient taking differently: Take 5 mg by mouth daily.)   digoxin (LANOXIN) 0.125 MG tablet Take 1 tablet by mouth every other day.   diphenhydrAMINE (SOMINEX) 25 MG tablet Take 25 mg by mouth at bedtime as needed for sleep or allergies.   ezetimibe (ZETIA) 10 MG tablet Take 1 tablet (10 mg total) by mouth daily.   furosemide (LASIX) 20 MG tablet TAKE ONE (1) TABLET BY MOUTH EVERY DAY. (Patient taking differently: Take 20 mg by mouth daily.)   gabapentin (NEURONTIN) 300 MG capsule Take 1 capsule (300 mg total) by mouth 3 (three) times daily.   hydrALAZINE (APRESOLINE) 100 MG tablet Take 100 mg by mouth 3 (three) times daily.   levothyroxine (SYNTHROID, LEVOTHROID) 75 MCG tablet Take 1 tablet by mouth daily.   Multiple Vitamins-Minerals (CENTRUM SILVER ADULT 50+ PO) Take 1 tablet by mouth daily.  Unknown strenght   NOVOLOG 100 UNIT/ML injection Inject 30 Units into the skin as directed.   omeprazole (PRILOSEC) 40 MG capsule Take 40 mg by mouth daily after supper.   rivaroxaban (XARELTO) 20 MG TABS tablet Take 1 tablet by mouth daily.   simvastatin (ZOCOR) 80 MG tablet Take 80 mg by mouth. 80 mg orally every morning     Allergies:   Methaqualone and Quinapril   Social History   Socioeconomic History   Marital status: Married    Spouse name: Not on file   Number of children: Not on file    Years of education: Not on file   Highest education level: Not on file  Occupational History   Not on file  Tobacco Use   Smoking status: Never   Smokeless tobacco: Current  Vaping Use   Vaping Use: Never used  Substance and Sexual Activity   Alcohol use: No   Drug use: No   Sexual activity: Not on file  Other Topics Concern   Not on file  Social History Narrative   Not on file   Social Determinants of Health   Financial Resource Strain: Not on file  Food Insecurity: No Food Insecurity   Worried About Running Out of Food in the Last Year: Never true   [place] in the Last Year: Never true  Transportation Needs: No Transportation Needs   Lack of Transportation (Medical): No   Lack of Transportation (Non-Medical): No  Physical Activity: Not on file  Stress: Not on file  Social Connections: Not on file     Family History: The patient's family history includes Heart attack in his father; Hyperlipidemia in his father; Stroke in his father. ROS:   Please see the history of present illness.    All 14 point review of systems negative except as described per history of present illness  EKGs/Labs/Other Studies Reviewed:      Recent Labs: 10/02/2020: NT-Pro BNP 575 11/08/2020: TSH 2.95 12/21/2020: BUN 27; Creatinine, Ser 1.44; Potassium 4.3; Sodium 137  Recent Lipid Panel    Component Value Date/Time   CHOL 134 05/31/2020 1324   TRIG 82 05/31/2020 1324   HDL 32 (L) 05/31/2020 1324   CHOLHDL 4.2 05/31/2020 1324   LDLCALC 86 05/31/2020 1324    Physical Exam:    VS:  BP 130/60 (BP Location: Left Arm, Patient Position: Sitting)   Pulse (!) 58   Ht 5\' 9"  (1.753 m)   Wt 185 lb 3.2 oz (84 kg)   SpO2 94%   BMI 27.35 kg/m     Wt Readings from Last 3 Encounters:  08/24/21 185 lb 3.2 oz (84 kg)  03/22/21 193 lb (87.5 kg)  02/28/21 195 lb (88.5 kg)     GEN:  Well nourished, well developed in no acute distress HEENT: Normal NECK: No JVD; No carotid  bruits LYMPHATICS: No lymphadenopathy CARDIAC: RRR, no murmurs, no rubs, no gallops RESPIRATORY:  Clear to auscultation without rales, wheezing or rhonchi  ABDOMEN: Soft, non-tender, non-distended MUSCULOSKELETAL:  No edema; No deformity  SKIN: Warm and dry LOWER EXTREMITIES: no swelling NEUROLOGIC:  Alert and oriented x 3 PSYCHIATRIC:  Normal affect   ASSESSMENT:    1. Paroxysmal atrial fibrillation (HCC)   2. Essential (primary) hypertension   3. Type 2 diabetes mellitus without complication, without long-term current use of insulin ([place])   4. Chronic pain syndrome    PLAN:    In order of problems listed above:  Paroxysmal  atrial fibrillation maintaining sinus rhythm.  He is on very small dose of amiodarone as well as anticoagulation which I will continue Essential hypertension blood pressure well controlled continue present management. Type 2 diabetes followed by antimedicine team. Right flank pain I do think is related to his heart does not have any characteristic that suggest that this is a heart pain.  He does have however calcification of coronary arteries and I told him in the future we will probably consider doing stress test. Sinus bradycardia which is ongoing problem.  I will ask him to wear Zio patch for a week. I advised him to follow-up with nephrologist he does have some anemia which probably is related to his kidney dysfunction.  Also we did notice worsening of his kidney function therefore nephrology appointment is excellent idea.  He does have already scheduled   Medication Adjustments/Labs and Tests Ordered: Current medicines are reviewed at length with the patient today.  Concerns regarding medicines are outlined above.  Orders Placed This Encounter  Procedures   LONG TERM MONITOR (3-14 DAYS)   EKG 12-Lead   Medication changes: No orders of the defined types were placed in this encounter.   Signed, Park Liter, MD, [facility] 08/24/2021 9:37 AM    [place]

## 2021-08-29 DIAGNOSIS — M546 Pain in thoracic spine: Secondary | ICD-10-CM | POA: Diagnosis not present

## 2021-08-29 DIAGNOSIS — M549 Dorsalgia, unspecified: Secondary | ICD-10-CM | POA: Diagnosis not present

## 2021-08-29 DIAGNOSIS — M791 Myalgia, unspecified site: Secondary | ICD-10-CM | POA: Diagnosis not present

## 2021-08-31 DIAGNOSIS — I480 Paroxysmal atrial fibrillation: Secondary | ICD-10-CM | POA: Diagnosis not present

## 2021-09-05 ENCOUNTER — Other Ambulatory Visit: Payer: Self-pay | Admitting: *Deleted

## 2021-09-05 ENCOUNTER — Other Ambulatory Visit: Payer: Self-pay

## 2021-09-05 NOTE — Patient Outreach (Signed)
[facility] Care Management  09/05/2021  Jeremiah Beasley. [birth year] [id number]   [facility] outreach to post hospital referred patient   Jeremiah Beasley was referred to [facility] on 07/30/21 after a discharge from [facility] was noted on 08/05/21 and with an ED visit on 07/28/21    Insurance Medicare     Outreach to [name]  Jeremiah Beasley is present and about to verify his HIPAA identifiers   Assessment Jeremiah Beasley reports he is doing well today except pain of his knee He is relating this to the recent weather change to 30-50 degree temperatures  He reports  his home personal management plan is taking Tylenol  He has not followed up on a knee brace  RN CM answered questions for him related to medicare guidelines and face to face needs with a medical provider. He voiced understanding RN CM reviewed other pain management interventions to include massage, ice/heat, chiropractor, acupuncture, keeping active  He reports he has made home improvement with the half bath on the lower level of the home It has been turned into a walk in shower   Jeremiah Beasley was allowed to ventilate He joked with RN CM about massage therapy  He agreed to further [facility] outreach   Plans Patient agrees to care plan and follow up within the next 30 business days and with plans of referral to [facility] disease management as needed   Jeremiah Beasley L. Lavina Hamman, RN, BSN, [place] Coordinator Office number [phone] [address] [facility] number [phone] Fax number [phone]

## 2021-09-06 DIAGNOSIS — M5124 Other intervertebral disc displacement, thoracic region: Secondary | ICD-10-CM | POA: Diagnosis not present

## 2021-09-06 DIAGNOSIS — D649 Anemia, unspecified: Secondary | ICD-10-CM | POA: Diagnosis not present

## 2021-09-06 DIAGNOSIS — E113412 Type 2 diabetes mellitus with severe nonproliferative diabetic retinopathy with macular edema, left eye: Secondary | ICD-10-CM | POA: Diagnosis not present

## 2021-09-06 DIAGNOSIS — E113411 Type 2 diabetes mellitus with severe nonproliferative diabetic retinopathy with macular edema, right eye: Secondary | ICD-10-CM | POA: Diagnosis not present

## 2021-09-06 DIAGNOSIS — M546 Pain in thoracic spine: Secondary | ICD-10-CM | POA: Diagnosis not present

## 2021-09-06 DIAGNOSIS — H2513 Age-related nuclear cataract, bilateral: Secondary | ICD-10-CM | POA: Diagnosis not present

## 2021-09-06 DIAGNOSIS — M47814 Spondylosis without myelopathy or radiculopathy, thoracic region: Secondary | ICD-10-CM | POA: Diagnosis not present

## 2021-09-07 DIAGNOSIS — I480 Paroxysmal atrial fibrillation: Secondary | ICD-10-CM | POA: Diagnosis not present

## 2021-09-11 DIAGNOSIS — R801 Persistent proteinuria, unspecified: Secondary | ICD-10-CM | POA: Diagnosis not present

## 2021-09-11 DIAGNOSIS — N183 Chronic kidney disease, stage 3 unspecified: Secondary | ICD-10-CM | POA: Insufficient documentation

## 2021-09-11 DIAGNOSIS — N1832 Chronic kidney disease, stage 3b: Secondary | ICD-10-CM | POA: Diagnosis not present

## 2021-09-11 DIAGNOSIS — I129 Hypertensive chronic kidney disease with stage 1 through stage 4 chronic kidney disease, or unspecified chronic kidney disease: Secondary | ICD-10-CM | POA: Diagnosis not present

## 2021-09-11 DIAGNOSIS — N281 Cyst of kidney, acquired: Secondary | ICD-10-CM | POA: Diagnosis not present

## 2021-09-11 DIAGNOSIS — N189 Chronic kidney disease, unspecified: Secondary | ICD-10-CM | POA: Diagnosis not present

## 2021-09-11 DIAGNOSIS — E1022 Type 1 diabetes mellitus with diabetic chronic kidney disease: Secondary | ICD-10-CM | POA: Diagnosis not present

## 2021-09-13 ENCOUNTER — Telehealth: Payer: Self-pay

## 2021-09-13 NOTE — Patient Outreach (Signed)
Mr. Atchley was transferred to [place] Management from the [facility] campaign.  He is already active with Joellyn Quails, RN with [place] Management.

## 2021-09-17 DIAGNOSIS — N281 Cyst of kidney, acquired: Secondary | ICD-10-CM | POA: Diagnosis not present

## 2021-09-17 DIAGNOSIS — J90 Pleural effusion, not elsewhere classified: Secondary | ICD-10-CM | POA: Diagnosis not present

## 2021-09-21 DIAGNOSIS — M546 Pain in thoracic spine: Secondary | ICD-10-CM | POA: Diagnosis not present

## 2021-09-21 DIAGNOSIS — M791 Myalgia, unspecified site: Secondary | ICD-10-CM | POA: Diagnosis not present

## 2021-09-24 ENCOUNTER — Ambulatory Visit (INDEPENDENT_AMBULATORY_CARE_PROVIDER_SITE_OTHER): Payer: Medicare Other | Admitting: Neurology

## 2021-09-24 ENCOUNTER — Other Ambulatory Visit: Payer: Self-pay

## 2021-09-24 ENCOUNTER — Encounter: Payer: Self-pay | Admitting: Neurology

## 2021-09-24 VITALS — BP 147/63 | HR 50 | Ht 69.0 in | Wt 184.0 lb

## 2021-09-24 DIAGNOSIS — E1342 Other specified diabetes mellitus with diabetic polyneuropathy: Secondary | ICD-10-CM

## 2021-09-24 MED ORDER — GABAPENTIN 300 MG PO CAPS: 300.0000 mg | ORAL_CAPSULE | Freq: Two times a day (BID) | ORAL | 3 refills | Status: AC

## 2021-09-24 NOTE — Patient Instructions (Signed)
Return to clinic in 1 year.

## 2021-09-24 NOTE — Progress Notes (Signed)
Follow-up Visit   Date: 09/24/21   Jeremiah Beasley. MRN: [id number] DOB: 10/03/1946   Interim History: Jeremiah Beasley. is a 75 y.o. male with atrial  fibrillation, hypertension, hyperlipidemia, hypothyroidism, and diabetes mellitus on insulin pump returning to the clinic for follow-up of diabetic neuropathy and worsening gait.  The patient was accompanied to the clinic by wife who also provides collateral information.    History of present illness: He has been diabetic for the past 20 years and developed neuropathy in the feet about 5-10 years ago.  His neuropathy consistent of numbness and tingling involving the toes and soles of the feet, it does not extend above the ankles.  He denies weakness.  He has some imbalance and suffered three falls this year.  He walks unassisted.  The tingling is painful and he takes gabapentin 200mg  TID, previously on 300mg  TID but recently reduced due to renal insuffiencey while hospitalized.  His most recent labs from 12/17 show normal renal function. He was admitted for acute cholecystitis complicated by atrial fibrillation and deconditioning. He is getting home PT now. He denies numbness/tingling in the hands.  No history of alcohol abuse.   UPDATE 03/22/2021:  He is here for follow-up appointment.  At his last visit, the diagnosis of diabetic neuropathy was discussed and labs for other causes of neuropathy.  Labs shows folate deficiency and low vitamin B12 (233).  He was referred for balance PT and reports no significant improvement.  Wife says that he has several spells of shuffling gait and moving his legs, and the only way to assist him is by giving him a walker.  This lasts about 1 day and then resolves.  He denies associated back pain, leg pain, lightheadedness, or change in awareness.  He also has a tremor on the left hand. Wife answers most of the questions for patient and directs the visit. Patient appears frustrated at times when she answers for  him.   UPDATE 09/24/2021: He is here for follow-up appointment.   Recently diagnosed with iron deficiency anemia and stage 3 renal disease. Wife feels that his tremors may be related to anemia, since it has improved. For neuropathy, he takes gabapentin 300 in the morning and 300mg  in the afternoon. He stopped night time dose and takes benadryl to help with sleep. Neuropathy has been stable.  He has largely become accustomed to his tingling and does not wish to make any changes.   Medications:  Current Outpatient Medications on File Prior to Visit  Medication Sig Dispense Refill   amiodarone (PACERONE) 200 MG tablet Take 100 mg by mouth daily.     amLODipine (NORVASC) 10 MG tablet Take 1 tablet (10 mg total) by mouth daily. (Patient taking differently: Take 5 mg by mouth daily.) 90 tablet 2   digoxin (LANOXIN) 0.125 MG tablet Take 1 tablet by mouth every other day.     diphenhydrAMINE (SOMINEX) 25 MG tablet Take 25 mg by mouth at bedtime as needed for sleep or allergies.     ezetimibe (ZETIA) 10 MG tablet Take 1 tablet (10 mg total) by mouth daily. 90 tablet 3   furosemide (LASIX) 20 MG tablet TAKE ONE (1) TABLET BY MOUTH EVERY DAY. (Patient taking differently: Take 20 mg by mouth daily.) 90 tablet 2   gabapentin (NEURONTIN) 300 MG capsule Take 1 capsule (300 mg total) by mouth 3 (three) times daily. 270 capsule 3   hydrALAZINE (APRESOLINE) 100 MG tablet Take 100 mg by mouth  3 (three) times daily.     levothyroxine (SYNTHROID, LEVOTHROID) 75 MCG tablet Take 1 tablet by mouth daily.     Multiple Vitamins-Minerals (CENTRUM SILVER ADULT 50+ PO) Take 1 tablet by mouth daily. Unknown strenght     NOVOLOG 100 UNIT/ML injection Inject 30 Units into the skin as directed.     omeprazole (PRILOSEC) 40 MG capsule Take 40 mg by mouth daily after supper.     rivaroxaban (XARELTO) 20 MG TABS tablet Take 1 tablet by mouth daily.     simvastatin (ZOCOR) 80 MG tablet Take 80 mg by mouth. 80 mg orally every morning      No current facility-administered medications on file prior to visit.    Allergies:  Allergies  Allergen Reactions   Methaqualone Other (See Comments)    unknown   Quinapril     Vital Signs:  BP (!) 147/63   Pulse (!) 50   Ht 5\' 9"  (1.753 m)   Wt 184 lb (83.5 kg)   SpO2 99%   BMI 27.17 kg/m    Neurological Exam: MENTAL STATUS including orientation to time, place, person, recent and remote memory, attention span and concentration, language, and fund of knowledge is normal.  Speech is not dysarthric. Paucity of speech.   CRANIAL NERVES:  No visual field defects.  Pupils equal round and reactive to light.  Normal conjugate, extra-ocular eye movements in all directions of gaze, except mildly restricted upgaze bilaterally.  No ptosis.  Face is symmetric. Palate elevates symmetrically.  MOTOR:  Motor strength is 5/5 in all extremities.  No tremor.   No pronator drift.  Tone is normal.    MSRs:  Reflexes are 2+/4 in the arms, 1+/4 at the knees, and trace at the ankles.  SENSORY:  Vibration reduced to 50% below the ankles.   COORDINATION/GAIT:  Normal finger-to- nose-finger.  Finger tapping intact.  Gait is mildly wide-based, antalgic due to right knee pain, unassisted.  Data: No results found for: HGBA1C Lab Results  Component Value Date   TSH 2.95 11/08/2020   Lab Results  Component Value Date   VITAMINB12 1,237 (H) 03/22/2021   Lab Results  Component Value Date   FOLATE >23.6 03/22/2021     IMPRESSION/PLAN: Diabetic polyneuropathy affecting the feet, longstanding, stable. - OK to take gabapentin 300mg  in the morning and 300mg  in the afternoon. Avoid further titration due to CKD - Patient educated on daily foot inspection, fall prevention, and safety precautions around the home.  Return to clinic 1 year.  Thank you for allowing me to participate in patient's care.  If I can answer any additional questions, I would be pleased to do so.     Sincerely,    [place] K. Posey Pronto, DO

## 2021-10-03 ENCOUNTER — Other Ambulatory Visit: Payer: Self-pay

## 2021-10-03 ENCOUNTER — Encounter: Payer: Self-pay | Admitting: *Deleted

## 2021-10-03 ENCOUNTER — Other Ambulatory Visit: Payer: Self-pay | Admitting: *Deleted

## 2021-10-03 NOTE — Patient Outreach (Signed)
[facility]) Care Management  10/03/2021  Jeremiah Beasley. [birth year] [id number]  [facility] Outreach to post hospital referred patient with case closure  Mr Jeremiah Beasley was referred to [facility] on 07/30/21 after a discharge from [facility] was noted on 08/05/21 and with an ED visit on 07/28/21    Insurance Medicare     Outreach to [place]  Mr Jeremiah Beasley is present and about to verify his HIPAA identifiers     Assessment  Mr Jeremiah Beasley informs [facility] RN CM he continues to do well  He reports he is sleeping good He reports some right knee pain but it is manageable and he uses a cane or a walker prn for ambulation He had PT 3 a week and injection for knee pain with good resolution     THN progression discussed and Mr Jeremiah Beasley requests [facility] case closure as he reports he does not feel he will need further outreaches for care management not disease management His hgA1c is reported below 7 closer to 6.0   Plans Case closure per patient request for closure    [place] L. Lavina Hamman, RN, BSN, [place] Coordinator Office number [phone] [facility] number [phone] Fax number [phone]

## 2021-10-06 ENCOUNTER — Encounter (HOSPITAL_BASED_OUTPATIENT_CLINIC_OR_DEPARTMENT_OTHER): Payer: Self-pay

## 2021-10-06 ENCOUNTER — Other Ambulatory Visit: Payer: Self-pay

## 2021-10-06 ENCOUNTER — Emergency Department (HOSPITAL_BASED_OUTPATIENT_CLINIC_OR_DEPARTMENT_OTHER): Payer: Medicare Other

## 2021-10-06 ENCOUNTER — Inpatient Hospital Stay (HOSPITAL_BASED_OUTPATIENT_CLINIC_OR_DEPARTMENT_OTHER)
Admission: EM | Admit: 2021-10-06 | Discharge: 2021-10-10 | DRG: 641 | Disposition: A | Discharge status: Home Health | Payer: Medicare Other | Attending: Internal Medicine | Admitting: Internal Medicine

## 2021-10-06 DIAGNOSIS — E039 Hypothyroidism, unspecified: Secondary | ICD-10-CM | POA: Diagnosis present

## 2021-10-06 DIAGNOSIS — E871 Hypo-osmolality and hyponatremia: Principal | ICD-10-CM

## 2021-10-06 DIAGNOSIS — Z794 Long term (current) use of insulin: Secondary | ICD-10-CM

## 2021-10-06 DIAGNOSIS — N183 Chronic kidney disease, stage 3 unspecified: Secondary | ICD-10-CM | POA: Diagnosis present

## 2021-10-06 DIAGNOSIS — I472 Ventricular tachycardia, unspecified: Secondary | ICD-10-CM | POA: Diagnosis present

## 2021-10-06 DIAGNOSIS — E1142 Type 2 diabetes mellitus with diabetic polyneuropathy: Secondary | ICD-10-CM | POA: Diagnosis present

## 2021-10-06 DIAGNOSIS — E861 Hypovolemia: Secondary | ICD-10-CM | POA: Diagnosis present

## 2021-10-06 DIAGNOSIS — E860 Dehydration: Secondary | ICD-10-CM | POA: Diagnosis present

## 2021-10-06 DIAGNOSIS — K2950 Unspecified chronic gastritis without bleeding: Secondary | ICD-10-CM | POA: Diagnosis present

## 2021-10-06 DIAGNOSIS — D509 Iron deficiency anemia, unspecified: Secondary | ICD-10-CM | POA: Diagnosis present

## 2021-10-06 DIAGNOSIS — Z20822 Contact with and (suspected) exposure to covid-19: Secondary | ICD-10-CM | POA: Diagnosis present

## 2021-10-06 DIAGNOSIS — Z79890 Hormone replacement therapy (postmenopausal): Secondary | ICD-10-CM

## 2021-10-06 DIAGNOSIS — G8929 Other chronic pain: Secondary | ICD-10-CM | POA: Diagnosis present

## 2021-10-06 DIAGNOSIS — N179 Acute kidney failure, unspecified: Secondary | ICD-10-CM | POA: Diagnosis present

## 2021-10-06 DIAGNOSIS — Z7901 Long term (current) use of anticoagulants: Secondary | ICD-10-CM

## 2021-10-06 DIAGNOSIS — I4891 Unspecified atrial fibrillation: Secondary | ICD-10-CM | POA: Diagnosis present

## 2021-10-06 DIAGNOSIS — Z9049 Acquired absence of other specified parts of digestive tract: Secondary | ICD-10-CM

## 2021-10-06 DIAGNOSIS — K5900 Constipation, unspecified: Secondary | ICD-10-CM | POA: Diagnosis present

## 2021-10-06 DIAGNOSIS — Z888 Allergy status to other drugs, medicaments and biological substances status: Secondary | ICD-10-CM

## 2021-10-06 DIAGNOSIS — D631 Anemia in chronic kidney disease: Secondary | ICD-10-CM | POA: Diagnosis present

## 2021-10-06 DIAGNOSIS — Z9641 Presence of insulin pump (external) (internal): Secondary | ICD-10-CM | POA: Diagnosis present

## 2021-10-06 DIAGNOSIS — J189 Pneumonia, unspecified organism: Secondary | ICD-10-CM | POA: Diagnosis not present

## 2021-10-06 DIAGNOSIS — K2970 Gastritis, unspecified, without bleeding: Secondary | ICD-10-CM | POA: Diagnosis not present

## 2021-10-06 DIAGNOSIS — I471 Supraventricular tachycardia: Secondary | ICD-10-CM | POA: Diagnosis present

## 2021-10-06 DIAGNOSIS — N1832 Chronic kidney disease, stage 3b: Secondary | ICD-10-CM | POA: Diagnosis present

## 2021-10-06 DIAGNOSIS — E1122 Type 2 diabetes mellitus with diabetic chronic kidney disease: Secondary | ICD-10-CM | POA: Diagnosis present

## 2021-10-06 DIAGNOSIS — R0609 Other forms of dyspnea: Secondary | ICD-10-CM | POA: Diagnosis not present

## 2021-10-06 DIAGNOSIS — E785 Hyperlipidemia, unspecified: Secondary | ICD-10-CM | POA: Diagnosis present

## 2021-10-06 DIAGNOSIS — I10 Essential (primary) hypertension: Secondary | ICD-10-CM | POA: Diagnosis present

## 2021-10-06 DIAGNOSIS — L03317 Cellulitis of buttock: Secondary | ICD-10-CM

## 2021-10-06 DIAGNOSIS — K219 Gastro-esophageal reflux disease without esophagitis: Secondary | ICD-10-CM | POA: Diagnosis present

## 2021-10-06 DIAGNOSIS — E119 Type 2 diabetes mellitus without complications: Secondary | ICD-10-CM | POA: Diagnosis not present

## 2021-10-06 DIAGNOSIS — M25561 Pain in right knee: Secondary | ICD-10-CM | POA: Diagnosis present

## 2021-10-06 DIAGNOSIS — S31819A Unspecified open wound of right buttock, initial encounter: Secondary | ICD-10-CM

## 2021-10-06 DIAGNOSIS — D62 Acute posthemorrhagic anemia: Secondary | ICD-10-CM | POA: Diagnosis present

## 2021-10-06 DIAGNOSIS — Z8249 Family history of ischemic heart disease and other diseases of the circulatory system: Secondary | ICD-10-CM

## 2021-10-06 DIAGNOSIS — E872 Acidosis, unspecified: Secondary | ICD-10-CM | POA: Diagnosis present

## 2021-10-06 DIAGNOSIS — R001 Bradycardia, unspecified: Secondary | ICD-10-CM | POA: Diagnosis present

## 2021-10-06 DIAGNOSIS — K31A0 Gastric intestinal metaplasia, unspecified: Secondary | ICD-10-CM | POA: Diagnosis not present

## 2021-10-06 DIAGNOSIS — K319 Disease of stomach and duodenum, unspecified: Secondary | ICD-10-CM | POA: Diagnosis not present

## 2021-10-06 DIAGNOSIS — I517 Cardiomegaly: Secondary | ICD-10-CM | POA: Diagnosis not present

## 2021-10-06 DIAGNOSIS — R9431 Abnormal electrocardiogram [ECG] [EKG]: Secondary | ICD-10-CM | POA: Diagnosis not present

## 2021-10-06 DIAGNOSIS — Z8614 Personal history of Methicillin resistant Staphylococcus aureus infection: Secondary | ICD-10-CM

## 2021-10-06 DIAGNOSIS — Z83438 Family history of other disorder of lipoprotein metabolism and other lipidemia: Secondary | ICD-10-CM

## 2021-10-06 DIAGNOSIS — I480 Paroxysmal atrial fibrillation: Secondary | ICD-10-CM | POA: Diagnosis present

## 2021-10-06 DIAGNOSIS — Z9181 History of falling: Secondary | ICD-10-CM

## 2021-10-06 DIAGNOSIS — E7800 Pure hypercholesterolemia, unspecified: Secondary | ICD-10-CM | POA: Diagnosis present

## 2021-10-06 DIAGNOSIS — R531 Weakness: Secondary | ICD-10-CM

## 2021-10-06 DIAGNOSIS — F17290 Nicotine dependence, other tobacco product, uncomplicated: Secondary | ICD-10-CM | POA: Diagnosis present

## 2021-10-06 DIAGNOSIS — Z79899 Other long term (current) drug therapy: Secondary | ICD-10-CM

## 2021-10-06 DIAGNOSIS — I129 Hypertensive chronic kidney disease with stage 1 through stage 4 chronic kidney disease, or unspecified chronic kidney disease: Secondary | ICD-10-CM | POA: Diagnosis present

## 2021-10-06 DIAGNOSIS — R296 Repeated falls: Secondary | ICD-10-CM | POA: Diagnosis not present

## 2021-10-06 DIAGNOSIS — K31A19 Gastric intestinal metaplasia without dysplasia, unspecified site: Secondary | ICD-10-CM | POA: Diagnosis not present

## 2021-10-06 DIAGNOSIS — I444 Left anterior fascicular block: Secondary | ICD-10-CM | POA: Diagnosis present

## 2021-10-06 DIAGNOSIS — Z5320 Procedure and treatment not carried out because of patient's decision for unspecified reasons: Secondary | ICD-10-CM | POA: Diagnosis not present

## 2021-10-06 DIAGNOSIS — I672 Cerebral atherosclerosis: Secondary | ICD-10-CM | POA: Diagnosis not present

## 2021-10-06 DIAGNOSIS — Z8701 Personal history of pneumonia (recurrent): Secondary | ICD-10-CM

## 2021-10-06 DIAGNOSIS — R5381 Other malaise: Secondary | ICD-10-CM | POA: Diagnosis present

## 2021-10-06 LAB — GLUCOSE, CAPILLARY: Glucose-Capillary: 181 mg/dL — ABNORMAL HIGH (ref 70–99)

## 2021-10-06 LAB — URINALYSIS, ROUTINE W REFLEX MICROSCOPIC
Bilirubin Urine: NEGATIVE
Glucose, UA: NEGATIVE mg/dL
Ketones, ur: NEGATIVE mg/dL
Leukocytes,Ua: NEGATIVE
Nitrite: NEGATIVE
Protein, ur: NEGATIVE mg/dL
Specific Gravity, Urine: 1.01 (ref 1.005–1.030)
pH: 5.5 (ref 5.0–8.0)

## 2021-10-06 LAB — DIFFERENTIAL
Abs Immature Granulocytes: 0.03 10*3/uL (ref 0.00–0.07)
Basophils Absolute: 0 10*3/uL (ref 0.0–0.1)
Basophils Relative: 0 %
Eosinophils Absolute: 0 10*3/uL (ref 0.0–0.5)
Eosinophils Relative: 1 %
Immature Granulocytes: 1 %
Lymphocytes Relative: 11 %
Lymphs Abs: 0.5 10*3/uL — ABNORMAL LOW (ref 0.7–4.0)
Monocytes Absolute: 0.4 10*3/uL (ref 0.1–1.0)
Monocytes Relative: 8 %
Neutro Abs: 3.7 10*3/uL (ref 1.7–7.7)
Neutrophils Relative %: 79 %

## 2021-10-06 LAB — CBC
HCT: 29.9 % — ABNORMAL LOW (ref 39.0–52.0)
HCT: 25.6 % — ABNORMAL LOW (ref 39.0–52.0)
Hemoglobin: 10.1 g/dL — ABNORMAL LOW (ref 13.0–17.0)
Hemoglobin: 8.4 g/dL — ABNORMAL LOW (ref 13.0–17.0)
MCH: 30.1 pg (ref 26.0–34.0)
MCH: 29.6 pg (ref 26.0–34.0)
MCHC: 33.8 g/dL (ref 30.0–36.0)
MCHC: 32.8 g/dL (ref 30.0–36.0)
MCV: 89 fL (ref 80.0–100.0)
MCV: 90.1 fL (ref 80.0–100.0)
Platelets: 255 10*3/uL (ref 150–400)
Platelets: 186 10*3/uL (ref 150–400)
RBC: 3.36 MIL/uL — ABNORMAL LOW (ref 4.22–5.81)
RBC: 2.84 MIL/uL — ABNORMAL LOW (ref 4.22–5.81)
RDW: 13 % (ref 11.5–15.5)
RDW: 13.1 % (ref 11.5–15.5)
WBC: 6.7 10*3/uL (ref 4.0–10.5)
WBC: 4.6 10*3/uL (ref 4.0–10.5)
nRBC: 0 % (ref 0.0–0.2)
nRBC: 0 % (ref 0.0–0.2)

## 2021-10-06 LAB — CBC WITH DIFFERENTIAL/PLATELET
Abs Immature Granulocytes: 0.06 10*3/uL (ref 0.00–0.07)
Basophils Absolute: 0.1 10*3/uL (ref 0.0–0.1)
Basophils Relative: 1 %
Eosinophils Absolute: 0.1 10*3/uL (ref 0.0–0.5)
Eosinophils Relative: 1 %
HCT: 31.2 % — ABNORMAL LOW (ref 39.0–52.0)
Hemoglobin: 10.2 g/dL — ABNORMAL LOW (ref 13.0–17.0)
Immature Granulocytes: 1 %
Lymphocytes Relative: 11 %
Lymphs Abs: 1 10*3/uL (ref 0.7–4.0)
MCH: 29.5 pg (ref 26.0–34.0)
MCHC: 32.7 g/dL (ref 30.0–36.0)
MCV: 90.2 fL (ref 80.0–100.0)
Monocytes Absolute: 0.6 10*3/uL (ref 0.1–1.0)
Monocytes Relative: 7 %
Neutro Abs: 7.3 10*3/uL (ref 1.7–7.7)
Neutrophils Relative %: 79 %
Platelets: 177 10*3/uL (ref 150–400)
RBC: 3.46 MIL/uL — ABNORMAL LOW (ref 4.22–5.81)
RDW: 13.2 % (ref 11.5–15.5)
WBC: 9.1 10*3/uL (ref 4.0–10.5)
nRBC: 0 % (ref 0.0–0.2)

## 2021-10-06 LAB — COMPREHENSIVE METABOLIC PANEL
ALT: 25 U/L (ref 0–44)
AST: 30 U/L (ref 15–41)
Albumin: 3.5 g/dL (ref 3.5–5.0)
Alkaline Phosphatase: 84 U/L (ref 38–126)
Anion gap: 10 (ref 5–15)
BUN: 27 mg/dL — ABNORMAL HIGH (ref 8–23)
CO2: 25 mmol/L (ref 22–32)
Calcium: 8.6 mg/dL — ABNORMAL LOW (ref 8.9–10.3)
Chloride: 94 mmol/L — ABNORMAL LOW (ref 98–111)
Creatinine, Ser: 2.07 mg/dL — ABNORMAL HIGH (ref 0.61–1.24)
GFR, Estimated: 33 mL/min — ABNORMAL LOW (ref >60)
Glucose, Bld: 169 mg/dL — ABNORMAL HIGH (ref 70–99)
Potassium: 4 mmol/L (ref 3.5–5.1)
Sodium: 129 mmol/L — ABNORMAL LOW (ref 135–145)
Total Bilirubin: 0.6 mg/dL (ref 0.3–1.2)
Total Protein: 7.8 g/dL (ref 6.5–8.1)

## 2021-10-06 LAB — URINALYSIS, MICROSCOPIC (REFLEX)

## 2021-10-06 LAB — BASIC METABOLIC PANEL
Anion gap: 9 (ref 5–15)
BUN: 28 mg/dL — ABNORMAL HIGH (ref 8–23)
CO2: 23 mmol/L (ref 22–32)
Calcium: 8.4 mg/dL — ABNORMAL LOW (ref 8.9–10.3)
Chloride: 94 mmol/L — ABNORMAL LOW (ref 98–111)
Creatinine, Ser: 1.96 mg/dL — ABNORMAL HIGH (ref 0.61–1.24)
GFR, Estimated: 36 mL/min — ABNORMAL LOW (ref >60)
Glucose, Bld: 154 mg/dL — ABNORMAL HIGH (ref 70–99)
Potassium: 3.6 mmol/L (ref 3.5–5.1)
Sodium: 126 mmol/L — ABNORMAL LOW (ref 135–145)

## 2021-10-06 LAB — RESP PANEL BY RT-PCR (FLU A&B, COVID) ARPGX2
Influenza A by PCR: NEGATIVE
Influenza B by PCR: NEGATIVE
SARS Coronavirus 2 by RT PCR: NEGATIVE

## 2021-10-06 LAB — PROTIME-INR
INR: 2.2 — ABNORMAL HIGH (ref 0.8–1.2)
Prothrombin Time: 24.7 seconds — ABNORMAL HIGH (ref 11.4–15.2)

## 2021-10-06 LAB — CBG MONITORING, ED
Glucose-Capillary: 160 mg/dL — ABNORMAL HIGH (ref 70–99)
Glucose-Capillary: 159 mg/dL — ABNORMAL HIGH (ref 70–99)

## 2021-10-06 LAB — AMMONIA: Ammonia: 14 umol/L (ref 9–35)

## 2021-10-06 LAB — LACTIC ACID, PLASMA
Lactic Acid, Venous: 2.7 mmol/L (ref 0.5–1.9)
Lactic Acid, Venous: 0.9 mmol/L (ref 0.5–1.9)

## 2021-10-06 LAB — BRAIN NATRIURETIC PEPTIDE: B Natriuretic Peptide: 88.7 pg/mL (ref 0.0–100.0)

## 2021-10-06 LAB — APTT: aPTT: 65 seconds — ABNORMAL HIGH (ref 24–36)

## 2021-10-06 MED ORDER — RIVAROXABAN 20 MG PO TABS
20.0000 mg | ORAL_TABLET | Freq: Every day | ORAL | Status: DC
  Administered 2021-10-06: 20 mg via ORAL
  Filled 2021-10-06: qty 1

## 2021-10-06 MED ORDER — VANCOMYCIN HCL IN DEXTROSE 1-5 GM/200ML-% IV SOLN
1000.0000 mg | Freq: Once | INTRAVENOUS | Status: AC
  Administered 2021-10-06: 1000 mg via INTRAVENOUS
  Filled 2021-10-06: qty 200

## 2021-10-06 MED ORDER — VANCOMYCIN VARIABLE DOSE PER UNSTABLE RENAL FUNCTION (PHARMACIST DOSING)
Status: DC
  Filled 2021-10-06: qty 1

## 2021-10-06 MED ORDER — SODIUM CHLORIDE 0.9 % IV SOLN
2.0000 g | INTRAVENOUS | Status: DC
  Administered 2021-10-07 – 2021-10-10 (×4): 2 g via INTRAVENOUS
  Filled 2021-10-06 (×4): qty 20

## 2021-10-06 MED ORDER — SACCHAROMYCES BOULARDII 250 MG PO CAPS
250.0000 mg | ORAL_CAPSULE | Freq: Two times a day (BID) | ORAL | Status: DC
  Administered 2021-10-06 – 2021-10-10 (×8): 250 mg via ORAL
  Filled 2021-10-06 (×8): qty 1

## 2021-10-06 MED ORDER — GABAPENTIN 300 MG PO CAPS
300.0000 mg | ORAL_CAPSULE | Freq: Two times a day (BID) | ORAL | Status: DC
  Administered 2021-10-06 – 2021-10-10 (×8): 300 mg via ORAL
  Filled 2021-10-06 (×8): qty 1

## 2021-10-06 MED ORDER — ACETAMINOPHEN 325 MG PO TABS: 650.0000 mg | ORAL_TABLET | Freq: Four times a day (QID) | ORAL | Status: DC | PRN

## 2021-10-06 MED ORDER — SODIUM CHLORIDE 0.9 % IV SOLN: INTRAVENOUS | Status: DC

## 2021-10-06 MED ORDER — ONDANSETRON HCL 4 MG/2ML IJ SOLN
4.0000 mg | Freq: Four times a day (QID) | INTRAMUSCULAR | Status: DC | PRN
  Administered 2021-10-07: 4 mg via INTRAVENOUS
  Filled 2021-10-06: qty 2

## 2021-10-06 MED ORDER — SODIUM CHLORIDE 0.9 % IV BOLUS (SEPSIS)
1000.0000 mL | Freq: Once | INTRAVENOUS | Status: AC
  Administered 2021-10-06: 1000 mL via INTRAVENOUS

## 2021-10-06 MED ORDER — HYDRALAZINE HCL 10 MG PO TABS
10.0000 mg | ORAL_TABLET | Freq: Three times a day (TID) | ORAL | Status: DC
  Administered 2021-10-06 – 2021-10-09 (×10): 10 mg via ORAL
  Filled 2021-10-06 (×10): qty 1

## 2021-10-06 MED ORDER — INSULIN ASPART 100 UNIT/ML IJ SOLN
0.0000 [IU] | Freq: Three times a day (TID) | INTRAMUSCULAR | Status: DC
  Administered 2021-10-07: 7 [IU] via SUBCUTANEOUS
  Administered 2021-10-07: 5 [IU] via SUBCUTANEOUS
  Administered 2021-10-07 – 2021-10-08 (×2): 3 [IU] via SUBCUTANEOUS
  Administered 2021-10-08: 2 [IU] via SUBCUTANEOUS
  Administered 2021-10-08 – 2021-10-09 (×2): 5 [IU] via SUBCUTANEOUS
  Administered 2021-10-09 (×2): 1 [IU] via SUBCUTANEOUS
  Administered 2021-10-10: 2 [IU] via SUBCUTANEOUS

## 2021-10-06 MED ORDER — SODIUM CHLORIDE 0.9 % IV SOLN
2.0000 g | Freq: Once | INTRAVENOUS | Status: AC
  Administered 2021-10-06: 2 g via INTRAVENOUS
  Filled 2021-10-06: qty 20

## 2021-10-06 MED ORDER — LINEZOLID 600 MG/300ML IV SOLN
600.0000 mg | Freq: Two times a day (BID) | INTRAVENOUS | Status: DC
  Administered 2021-10-06 – 2021-10-10 (×8): 600 mg via INTRAVENOUS
  Filled 2021-10-06 (×9): qty 300

## 2021-10-06 MED ORDER — POLYETHYLENE GLYCOL 3350 17 G PO PACK: 17.0000 g | PACK | Freq: Every day | ORAL | Status: DC | PRN

## 2021-10-06 MED ORDER — PANTOPRAZOLE SODIUM 40 MG PO TBEC
40.0000 mg | DELAYED_RELEASE_TABLET | Freq: Every day | ORAL | Status: DC
  Administered 2021-10-06 – 2021-10-10 (×5): 40 mg via ORAL
  Filled 2021-10-06 (×5): qty 1

## 2021-10-06 MED ORDER — MELATONIN 3 MG PO TABS
3.0000 mg | ORAL_TABLET | Freq: Every evening | ORAL | Status: DC | PRN
  Administered 2021-10-07 – 2021-10-08 (×3): 3 mg via ORAL
  Filled 2021-10-06 (×3): qty 1

## 2021-10-06 MED ORDER — LEVOTHYROXINE SODIUM 75 MCG PO TABS
75.0000 ug | ORAL_TABLET | Freq: Every day | ORAL | Status: DC
Start: 2021-10-07 — End: 2021-10-10
  Administered 2021-10-07 – 2021-10-09 (×3): 75 ug via ORAL
  Filled 2021-10-06 (×3): qty 1

## 2021-10-06 MED ORDER — INSULIN ASPART 100 UNIT/ML IJ SOLN
0.0000 [IU] | Freq: Every day | INTRAMUSCULAR | Status: DC
  Administered 2021-10-07: 2 [IU] via SUBCUTANEOUS

## 2021-10-06 NOTE — H&P (Signed)
History and Physical  Jeremiah Beasley. PNT:[id number] DOB: 1946-10-29 DOA: 10/06/2021  Referring physician: Accepted by Dr. Olevia Bowens, [facility].  PCP: [name], MD  Outpatient Specialists: Cardiology, neurology, nephrology, IR, endocrinology. Patient coming from: Home.  Chief Complaint: Generalized weakness and recurrent falls.  HPI: Jeremiah Beasley. is a 75 y.o. male with medical history significant for paroxysmal atrial fibrillation on Xarelto, type 2 diabetes on insulin pump, diabetic polyneuropathy, GERD, essential hypertension, hyperlipidemia, hypothyroidism, intolerance to ACE inhibitor due to tongue swelling, who presented to [facility] ED, brought in by his wife and son due to generalized weakness and multiple falls, 5 times this week, including 1 fall prior to presenting to [facility] ED.  Associated with poor oral intake, a wound on his right buttock, intermittent fevers at home with T-max 101.2 a few days ago.  Per his son at bedside his weakness has rapidly progressed for the past 2 weeks to the point where he shakes when he stands up due to his generalized weakness.  Has had unintentional weight loss, 24 pounds in the last 3 months due to lack of appetite and poor oral intake.    Per his wife at bedside his decline started almost a year ago after his gallbladder surgery.  He had a prolonged hospital stay and since then he has progressively declined.  Went from ambulating with a cane to needing a walker to ambulate short distances.    Upon presentation to the ED, patient appears weak and severely debilitated.  Per his son at bedside, he had an episode of staring off and gurgling while in the waiting room in the ED, unclear if the patient had a syncopal episode.  There were no witnessed seizure-like activity.    Work-up in the ED revealed hypovolemic hyponatremia and AKI, likely prerenal.  EDP requested admission for further evaluation and management of present condition.  Patient was admitted as a  direct admit from [facility] ED to [facility] progressive care unit by Dr. Olevia Bowens, from [facility], Hospitalist service.  ED Course: Temperature 98.3.  BP 152/62, pulse 64, respiration rate 14, O2 saturation 96% on room air.  Lab studies remarkable for serum sodium 126, chloride 94, glucose 154, BUN 28, creatinine 1.96, GFR 36.  Lactic acid 2.7.  WBC 9.1.  Hemoglobin 10.2.  Platelet 177.  UA few bacteria, WBC 0-5.  Right buttock wound.  Review of Systems: Review of systems as noted in the HPI. All other systems reviewed and are negative.   Past Medical History:  Diagnosis Date   Atrial fibrillation ([place])    Chronic pain of right knee 04/11/2020   Diabetes mellitus without complication ([place])    Essential hypertension 05/15/2015   Hyperlipidemia    Hypertension    MRSA (methicillin resistant staph aureus) culture positive    Paroxysmal atrial fibrillation ([facility]) 05/15/2015   Pneumonia 10/20/2020   Pure hypercholesterolemia 05/15/2015   S/P right knee arthroscopy 04/25/2020   Type 2 diabetes mellitus without complication ([place]) 08/67/6195   Past Surgical History:  Procedure Laterality Date   gall bladder removed   10/20/2020   Iron infusion  05/18/2021   x2   NO PAST SURGERIES      Social History:  reports that he has never smoked. He uses smokeless tobacco. He reports that he does not drink alcohol and does not use drugs.   Allergies  Allergen Reactions   Methaqualone Other (See Comments)    unknown   Quinapril Swelling    TONGUE SWELLLING  Family History  Problem Relation Age of Onset   Heart attack Father    Hyperlipidemia Father    Stroke Father       Prior to Admission medications   Medication Sig Start Date End Date Taking? Authorizing Provider  amiodarone (PACERONE) 200 MG tablet Take 100 mg by mouth daily. 11/12/16   [provider]  amLODipine (NORVASC) 10 MG tablet Take 1 tablet (10 mg total) by mouth daily. Patient taking differently: Take 5 mg by  mouth daily. 12/21/20 09/24/21  Park Liter, MD  digoxin (LANOXIN) 0.125 MG tablet Take 1 tablet by mouth every other day.    [provider]  diphenhydrAMINE (SOMINEX) 25 MG tablet Take 25 mg by mouth at bedtime as needed for sleep or allergies.    [provider]  ezetimibe (ZETIA) 10 MG tablet Take 1 tablet (10 mg total) by mouth daily. 06/08/20 09/24/21  Park Liter, MD  furosemide (LASIX) 20 MG tablet TAKE ONE (1) TABLET BY MOUTH EVERY DAY. Patient taking differently: Take 20 mg by mouth daily. 04/02/21   Park Liter, MD  gabapentin (NEURONTIN) 300 MG capsule Take 1 capsule (300 mg total) by mouth 2 (two) times daily. 09/24/21   Narda Amber K, DO  hydrALAZINE (APRESOLINE) 100 MG tablet Take 100 mg by mouth 3 (three) times daily. 01/25/21   [provider]  levothyroxine (SYNTHROID, LEVOTHROID) 75 MCG tablet Take 1 tablet by mouth daily. 05/22/16   [provider]  Multiple Vitamins-Minerals (CENTRUM SILVER ADULT 50+ PO) Take 1 tablet by mouth daily. Unknown strenght    [provider]  NOVOLOG 100 UNIT/ML injection Inject 30 Units into the skin as directed. 07/09/21   [provider]  omeprazole (PRILOSEC) 40 MG capsule Take 40 mg by mouth daily after supper. 02/16/21   [provider]  rivaroxaban (XARELTO) 20 MG TABS tablet Take 1 tablet by mouth daily. 10/08/16   [provider]  simvastatin (ZOCOR) 80 MG tablet Take 80 mg by mouth. 80 mg orally every morning 03/12/19   [provider]    Physical Exam: BP (!) 152/65 (BP Location: Right Arm)   Pulse (!) 54   Temp 98 F (36.7 C) (Oral)   Resp 14   Ht 5\' 9"  (1.753 m)   Wt 81.9 kg   SpO2 96%   BMI 26.67 kg/m   General: 75 y.o. year-old male well developed well nourished in no acute distress.  Alert and minimally interactive. Cardiovascular: Regular rate and rhythm with no rubs or gallops.  No thyromegaly or JVD noted.  Trace lower extremity  edema. 2/4 pulses in all 4 extremities. Respiratory: Clear to auscultation with no wheezes or rales. Good inspiratory effort. Abdomen: Soft nontender nondistended with normal bowel sounds x4 quadrants. Muskuloskeletal: No cyanosis or clubbing.  Trace edema noted bilaterally Neuro: CN II-XII intact, strength, sensation, reflexes Skin: Right buttock cellulitis and wound, POA. Psychiatry: Judgement and insight appear normal. Mood is appropriate for condition and setting          Labs on Admission:  Basic Metabolic Panel: Recent Labs  Lab 10/06/21 0920 10/06/21 1216  NA 126* 129*  K 3.6 4.0  CL 94* 94*  CO2 23 25  GLUCOSE 154* 169*  BUN 28* 27*  CREATININE 1.96* 2.07*  CALCIUM 8.4* 8.6*    Liver Function Tests: Recent Labs  Lab 10/06/21 1216  AST 30  ALT 25  ALKPHOS 84  BILITOT 0.6  PROT 7.8  ALBUMIN  3.5    No results for input(s): LIPASE, AMYLASE in the last 168 hours. Recent Labs  Lab 10/06/21 1216  AMMONIA 14    CBC: Recent Labs  Lab 10/06/21 0920 10/06/21 1216 10/06/21 1653  WBC 6.7 9.1 4.6  NEUTROABS  --  7.3 3.7  HGB 10.1* 10.2* 8.4*  HCT 29.9* 31.2* 25.6*  MCV 89.0 90.2 90.1  PLT 255 177 186    Cardiac Enzymes: No results for input(s): CKTOTAL, CKMB, CKMBINDEX, TROPONINI in the last 168 hours.  BNP (last 3 results) Recent Labs    10/06/21 1216  BNP 88.7     ProBNP (last 3 results) No results for input(s): PROBNP in the last 8760 hours.  CBG: Recent Labs  Lab 10/06/21 0949 10/06/21 1510  GLUCAP 160* 159*     Radiological Exams on Admission: CT Head Wo Contrast  Result Date: 10/06/2021 CLINICAL DATA:  75 year old male with weakness and multiple falls. EXAM: CT HEAD WITHOUT CONTRAST TECHNIQUE: Contiguous axial images were obtained from the base of the skull through the vertex without intravenous contrast. COMPARISON:  Brain MRI 06/25/2006.  Head CT 04/26/2021. FINDINGS: Brain: Stable cerebral volume since June. No midline shift,  ventriculomegaly, mass effect, evidence of mass lesion, intracranial hemorrhage or evidence of cortically based acute infarction. Gray-white matter differentiation is stable and largely normal for age. There is minimal to mild periventricular white matter hypodensity. Vascular: Calcified atherosclerosis at the skull base. No suspicious intracranial vascular hyperdensity. Skull: Stable, intact. Sinuses/Orbits: Visualized paranasal sinuses and mastoids are stable and well aerated. Other: Visualized orbits and scalp soft tissues are within normal limits. IMPRESSION: No acute intracranial abnormality. Stable mild for age cerebral white matter changes. Electronically Signed   By: Genevie Ann M.D.   On: 10/06/2021 11:37   DG Chest Port 1 View  Result Date: 10/06/2021 CLINICAL DATA:  75 year old male with possible sepsis. EXAM: PORTABLE CHEST 1 VIEW COMPARISON:  CT Chest, Abdomen, and Pelvis 11/20/2020 and earlier. FINDINGS: Portable AP upright view at 1056 hours. Mildly improved lung volumes. Mediastinal contours are stable. Borderline to mild cardiomegaly. Allowing for portable technique the lungs are clear. Visualized tracheal air column is within normal limits. No pneumothorax or pleural effusion. Negative visible bowel gas. No acute osseous abnormality identified. IMPRESSION: No acute cardiopulmonary abnormality. Electronically Signed   By: Genevie Ann M.D.   On: 10/06/2021 11:38    EKG: I independently viewed the EKG done and my findings are as followed: Sinus rhythm rate of 70.  Nonspecific ST-T changes.  QTc 433.  Assessment/Plan Present on Admission:  Hyponatremia  Principal Problem:   Hyponatremia Active Problems:   Generalized weakness  Generalized weakness and recurrent falls, unclear etiology Reported 5 falls this week, 1 at home, prior to presenting to [facility] ED Rule out active infective process Obtain orthostatic vital signs. Obtain TSH and CPK level, follow results Start gentle IV fluid  hydration normal saline at 50 cc/h x 2 days. Presented with a right buttock wound/cellulitis, present for a week. Treat empirically with linezolid and Rocephin, history of MRSA- Add Florastor 250 mg twice daily. Follow-up urine culture, blood cultures x2 peripherally. Closely monitor fever curve and WBC Replete electrolytes as indicated Treat underlying conditions PT OT assessment Fall precautions  Hypovolemic hyponatremia Presented with serum sodium of 126, repeat 129. Hypovolemic on exam Start normal saline at 30 cc/h x 2 days Closely monitor volume status while on IV fluid. Repeat BMP in the morning  Transient bradycardia Hold off Norvasc for now, avoid  AV nodal blockade agents for now until seen by cardiology.  Check TSH level. Consider cardiology consult in the morning. Continue to closely monitor  Right buttock cellulitis/wound Wound care specialist consulted Continue local wound care Continue broad-spectrum IV antibiotics Follow MRSA screen  AKI on CKD 3B likely prerenal in the setting of dehydration and poor oral intake Baseline creatinine appears to be 1.51 with GFR of 52 Creatinine 2.07 with GFR of 33 Avoid nephrotoxic agents, dehydration and hypotension Closely monitor urine output with strict I's and O's Continue IV fluid hydration Repeat BMP in the morning.  Type 2 diabetes on insulin pump with hyperglycemia Obtain hemoglobin A1c Serum glucose 169 Diabetes coordinator Hold off insulin pump for now Start insulin sliding scale.  Diabetic polyneuropathy Resume home gabapentin  Paroxysmal A. fib on Xarelto Rate controlled on digoxin On amiodarone for rhythm control Closely monitor on telemetry  Essential hypertension, not at goal BP is elevated Resume home oral antihypertensives once home medications are reconciled. Closely monitor vital signs.  Hyperlipidemia Follow CPK results prior to restarting home Zocor  Hypothyroidism Obtain TSH Resume  home levothyroxine  Anemia of chronic disease/chronic normocytic anemia in the setting of CKD 3B/chronic wound Hemoglobin 10.2K Monitor H&H Obtain iron studies  GERD Resume home PPI  Resolved elevated lactic acid On presentation lactic acid 2.7 Received 1 L IV fluid bolus in the ED Repeat lactic acid post IV fluid hydration 0.9.        DVT prophylaxis: Xarelto  Code Status: Full code  Family Communication: Wife and son at bedside.  Disposition Plan: Admitted to progressive unit  Consults called: Please consult cardiology in the morning  Admission status: Inpatient status.   Status is: Inpatient  Inpatient status.  Patient will require at least 2 midnights for further evaluation and treatment of present condition.     Kayleen Memos MD [facility] Pager [phone]  If 7PM-7AM, please contact night-coverage [email or web address] Password [email or web address]  10/06/2021, 6:19 PM

## 2021-10-06 NOTE — ED Notes (Signed)
SpO2 91% on r/a placed on 2lpm O2 SPo2 97%, BBS cta decreased

## 2021-10-06 NOTE — ED Notes (Addendum)
Called to lobby by registration that pt experienced syncope, endorses drooling. Pt pale and weak, patient taken to room

## 2021-10-06 NOTE — ED Provider Notes (Signed)
[facility] EMERGENCY DEPARTMENT Provider Note   CSN: [id number] Arrival date & time: 10/06/21  3335     History Chief Complaint  Patient presents with   Weakness    Jerol Rufener. is a 75 y.o. male.  HPI      75yo male with history of atrial fibrillation, DM, hypertension, hyperlipidemia, CKD, who presents with concern for generalized weakness.   He has had progressive decline over the last year, increasing over the last 6 months and significant change over the last week.  6 weeks ago, he was seen at [facility] for chest pain and was evaluated and discharged. He has had intermittent chest pain and back pain since but denies any symptoms like that today.  Family reports he used to walk with a cane, then a week ago was walking with a  walker, and today cannot walk at all. Report he seems weak in both legs, but also that his arms are not as strong. Has been sleepier, sleeping more over last 6 months but more significant over last week.  Seems more weak all over on further discussion.  Denies nausea, vomiting, diarrhea, black or blood stools.  Did have a fall last week, has had several falls but only one where he hit his head.    He also has an area of pain, redness and drainage from his right buttock, had a fever of 102 a few days ago but don't think he has had one since.   No recent medication changes, is on insulin pump No etoh, drugs, smoking  WHile in waiting room he suddenly stared off and was gurgling, unclear if syncopal episode, did not witness seizure like activity    Past Medical History:  Diagnosis Date   Atrial fibrillation (HCC)    Chronic pain of right knee 04/11/2020   Diabetes mellitus without complication ([place])    Essential hypertension 05/15/2015   Hyperlipidemia    Hypertension    MRSA (methicillin resistant staph aureus) culture positive    Paroxysmal atrial fibrillation ([place]) 05/15/2015   Pneumonia 10/20/2020   Pure hypercholesterolemia  05/15/2015   S/P right knee arthroscopy 04/25/2020   Type 2 diabetes mellitus without complication ([place]) 45/62/5638    Patient Active Problem List   Diagnosis Date Noted   Hyponatremia 10/06/2021   Generalized weakness 10/06/2021   Type 1 diabetes with stage 3 chronic kidney disease moderate GFR 30-59 ([place]) 09/11/2021   Benign hypertension with chronic kidney disease, stage III ([place]) 09/11/2021   Hyperlipidemia 07/14/2021   Body mass index (BMI) 28.0-28.9, adult 01/10/2021   Chronic pain syndrome 01/10/2021   On anticoagulant therapy 01/10/2021   Primary osteoarthritis of right knee 01/10/2021   Hypertension    Atrial fibrillation ([place])    Pneumonia 10/20/2020   S/P right knee arthroscopy 04/25/2020   Chronic pain of right knee 04/11/2020   Essential (primary) hypertension 05/15/2015   Paroxysmal atrial fibrillation ([place]) 05/15/2015   Pure hypercholesterolemia 05/15/2015   Type 2 diabetes mellitus without complication ([place]) 93/73/4287    Past Surgical History:  Procedure Laterality Date   gall bladder removed   10/20/2020   Iron infusion  05/18/2021   x2   NO PAST SURGERIES         Family History  Problem Relation Age of Onset   Heart attack Father    Hyperlipidemia Father    Stroke Father     Social History   Tobacco Use   Smoking status: Never   Smokeless tobacco: Current  Vaping Use   Vaping Use: Never used  Substance Use Topics   Alcohol use: No   Drug use: No    Home Medications Prior to Admission medications   Medication Sig Start Date End Date Taking? Authorizing Provider  amiodarone (PACERONE) 200 MG tablet Take 100 mg by mouth daily. 11/12/16  Yes [provider]  amLODipine (NORVASC) 5 MG tablet Take 5 mg by mouth at bedtime.   Yes [provider]  digoxin (LANOXIN) 0.125 MG tablet Take 0.125 mg by mouth every other day.   Yes [provider]  diphenhydrAMINE (BENADRYL) 25 mg capsule Take 25 mg by mouth at bedtime.    Yes [provider]  ezetimibe (ZETIA) 10 MG tablet Take 1 tablet (10 mg total) by mouth daily. 06/08/20 11/05/21 Yes Park Liter, MD  furosemide (LASIX) 20 MG tablet TAKE ONE (1) TABLET BY MOUTH EVERY DAY. Patient taking differently: Take 20 mg by mouth in the morning. 04/02/21  Yes Park Liter, MD  gabapentin (NEURONTIN) 300 MG capsule Take 1 capsule (300 mg total) by mouth 2 (two) times daily. 09/24/21  Yes Patel, Donika K, DO  hydrALAZINE (APRESOLINE) 100 MG tablet Take 100 mg by mouth 3 (three) times daily. 01/25/21  Yes [provider]  Insulin Human (INSULIN PUMP) SOLN Inject 1 each into the skin continuous.   Yes [provider]  levothyroxine (SYNTHROID, LEVOTHROID) 75 MCG tablet Take 75 mcg by mouth daily before breakfast. 05/22/16  Yes [provider]  Multiple Vitamins-Minerals (CENTRUM SILVER ADULT 50+ PO) Take 1 tablet by mouth daily with breakfast.   Yes [provider]  omeprazole (PRILOSEC) 40 MG capsule Take 40 mg by mouth daily before supper. 02/16/21  Yes [provider]  rivaroxaban (XARELTO) 20 MG TABS tablet Take 20 mg by mouth daily with supper. 10/08/16  Yes [provider]  simvastatin (ZOCOR) 80 MG tablet Take 80 mg by mouth daily. 03/12/19  Yes [provider]  amLODipine (NORVASC) 10 MG tablet Take 1 tablet (10 mg total) by mouth daily. Patient not taking: Reported on 10/06/2021 12/21/20 10/07/21  Park Liter, MD    Allergies    Quinapril, Methaqualone, and Tape  Review of Systems   Review of Systems  Constitutional:  Positive for fatigue and fever.  HENT:  Negative for sore throat.   Eyes:  Negative for visual disturbance.  Respiratory:  Negative for cough and shortness of breath.   Cardiovascular:  Negative for chest pain (not for last few days).  Gastrointestinal:  Negative for abdominal pain, diarrhea, nausea and vomiting.  Genitourinary:  Negative for difficulty urinating.   Musculoskeletal:  Positive for myalgias. Negative for back pain and neck stiffness.  Skin:  Positive for rash and wound.  Neurological:  Positive for headaches. Negative for syncope, facial asymmetry and numbness. Weakness: generalized.  Physical Exam Updated Vital Signs BP (!) 152/61 (BP Location: Right Arm)   Pulse (!) 55   Temp 98 F (36.7 C) (Oral)   Resp 20   Ht 5\' 9"  (1.753 m)   Wt 81.9 kg   SpO2 92%   BMI 26.67 kg/m   Physical Exam Vitals and nursing note reviewed.  Constitutional:      General: He is not in acute distress.    Appearance: He is well-developed. He is ill-appearing and toxic-appearing. He is not diaphoretic.  HENT:     Head: Normocephalic and atraumatic.  Eyes:     Conjunctiva/sclera: Conjunctivae normal.  Cardiovascular:  Rate and Rhythm: Normal rate and regular rhythm.     Heart sounds: Normal heart sounds. No murmur heard.   No friction rub. No gallop.  Pulmonary:     Effort: Pulmonary effort is normal. No respiratory distress.     Breath sounds: Normal breath sounds. No wheezing or rales.  Abdominal:     General: There is no distension.     Palpations: Abdomen is soft.     Tenderness: There is no abdominal tenderness. There is no guarding.  Musculoskeletal:        General: Tenderness (right gluteal) present.     Cervical back: Normal range of motion.  Skin:    General: Skin is warm and dry.     Findings: Erythema (10cm area of erythem right gluteal, 1cm central ulceration, no drainage, no fluctuance, significant induration) present.  Neurological:     Mental Status: He is alert and oriented to person, place, and time.    ED Results / Procedures / Treatments   Labs (all labs ordered are listed, but only abnormal results are displayed) Labs Reviewed  BASIC METABOLIC PANEL - Abnormal; Notable for the following components:      Result Value   Sodium 126 (*)    Chloride 94 (*)    Glucose, Bld 154 (*)    BUN 28 (*)    Creatinine, Ser  1.96 (*)    Calcium 8.4 (*)    GFR, Estimated 36 (*)    All other components within normal limits  CBC - Abnormal; Notable for the following components:   RBC 3.36 (*)    Hemoglobin 10.1 (*)    HCT 29.9 (*)    All other components within normal limits  URINALYSIS, ROUTINE W REFLEX MICROSCOPIC - Abnormal; Notable for the following components:   Hgb urine dipstick TRACE (*)    All other components within normal limits  DIFFERENTIAL - Abnormal; Notable for the following components:   Lymphs Abs 0.5 (*)    All other components within normal limits  LACTIC ACID, PLASMA - Abnormal; Notable for the following components:   Lactic Acid, Venous 2.7 (*)    All other components within normal limits  COMPREHENSIVE METABOLIC PANEL - Abnormal; Notable for the following components:   Sodium 129 (*)    Chloride 94 (*)    Glucose, Bld 169 (*)    BUN 27 (*)    Creatinine, Ser 2.07 (*)    Calcium 8.6 (*)    GFR, Estimated 33 (*)    All other components within normal limits  CBC WITH DIFFERENTIAL/PLATELET - Abnormal; Notable for the following components:   RBC 3.46 (*)    Hemoglobin 10.2 (*)    HCT 31.2 (*)    All other components within normal limits  PROTIME-INR - Abnormal; Notable for the following components:   Prothrombin Time 24.7 (*)    INR 2.2 (*)    All other components within normal limits  APTT - Abnormal; Notable for the following components:   aPTT 65 (*)    All other components within normal limits  URINALYSIS, MICROSCOPIC (REFLEX) - Abnormal; Notable for the following components:   Bacteria, UA FEW (*)    All other components within normal limits  CBC - Abnormal; Notable for the following components:   RBC 2.84 (*)    Hemoglobin 8.4 (*)    HCT 25.6 (*)    All other components within normal limits  GLUCOSE, CAPILLARY - Abnormal; Notable for the following components:  Glucose-Capillary 181 (*)    All other components within normal limits  CBG MONITORING, ED - Abnormal;  Notable for the following components:   Glucose-Capillary 160 (*)    All other components within normal limits  CBG MONITORING, ED - Abnormal; Notable for the following components:   Glucose-Capillary 159 (*)    All other components within normal limits  RESP PANEL BY RT-PCR (FLU A&B, COVID) ARPGX2  CULTURE, BLOOD (ROUTINE X 2)  CULTURE, BLOOD (ROUTINE X 2)  CULTURE, BLOOD (ROUTINE X 2)  CULTURE, BLOOD (ROUTINE X 2)  URINE CULTURE  LACTIC ACID, PLASMA  BRAIN NATRIURETIC PEPTIDE  AMMONIA  TSH  CBC  COMPREHENSIVE METABOLIC PANEL  MAGNESIUM  PHOSPHORUS  PROCALCITONIN  LACTIC ACID, PLASMA  HEMOGLOBIN A1C  CK    EKG EKG Interpretation  Date/Time:  Saturday October 06 2021 09:54:20 EST Ventricular Rate:  70 PR Interval:  198 QRS Duration: 117 QT Interval:  401 QTC Calculation: 433 R Axis:   -48 Text Interpretation: Sinus rhythm LAD, consider left anterior fascicular block Nonspecific T abnormalities, lateral leads No previous ECGs available Confirmed by Gareth Morgan [phone]) on 10/06/2021 11:58:35 PM  Radiology CT Head Wo Contrast  Result Date: 10/06/2021 CLINICAL DATA:  75 year old male with weakness and multiple falls. EXAM: CT HEAD WITHOUT CONTRAST TECHNIQUE: Contiguous axial images were obtained from the base of the skull through the vertex without intravenous contrast. COMPARISON:  Brain MRI 06/25/2006.  Head CT 04/26/2021. FINDINGS: Brain: Stable cerebral volume since June. No midline shift, ventriculomegaly, mass effect, evidence of mass lesion, intracranial hemorrhage or evidence of cortically based acute infarction. Gray-white matter differentiation is stable and largely normal for age. There is minimal to mild periventricular white matter hypodensity. Vascular: Calcified atherosclerosis at the skull base. No suspicious intracranial vascular hyperdensity. Skull: Stable, intact. Sinuses/Orbits: Visualized paranasal sinuses and mastoids are stable and well aerated.  Other: Visualized orbits and scalp soft tissues are within normal limits. IMPRESSION: No acute intracranial abnormality. Stable mild for age cerebral white matter changes. Electronically Signed   By: Genevie Ann M.D.   On: 10/06/2021 11:37   DG Chest Port 1 View  Result Date: 10/06/2021 CLINICAL DATA:  75 year old male with possible sepsis. EXAM: PORTABLE CHEST 1 VIEW COMPARISON:  CT Chest, Abdomen, and Pelvis 11/20/2020 and earlier. FINDINGS: Portable AP upright view at 1056 hours. Mildly improved lung volumes. Mediastinal contours are stable. Borderline to mild cardiomegaly. Allowing for portable technique the lungs are clear. Visualized tracheal air column is within normal limits. No pneumothorax or pleural effusion. Negative visible bowel gas. No acute osseous abnormality identified. IMPRESSION: No acute cardiopulmonary abnormality. Electronically Signed   By: Genevie Ann M.D.   On: 10/06/2021 11:38    Procedures Procedures   Medications Ordered in ED Medications  linezolid (ZYVOX) IVPB 600 mg (600 mg Intravenous New Bag/Given 10/06/21 2139)  cefTRIAXone (ROCEPHIN) 2 g in sodium chloride 0.9 % 100 mL IVPB (has no administration in time range)  saccharomyces boulardii (FLORASTOR) capsule 250 mg (250 mg Oral Given 10/06/21 2140)  0.9 %  sodium chloride infusion ( Intravenous New Bag/Given 10/06/21 2135)  insulin aspart (novoLOG) injection 0-9 Units (has no administration in time range)  insulin aspart (novoLOG) injection 0-5 Units (0 Units Subcutaneous Not Given 10/06/21 2316)  acetaminophen (TYLENOL) tablet 650 mg (has no administration in time range)  polyethylene glycol (MIRALAX / GLYCOLAX) packet 17 g (has no administration in time range)  melatonin tablet 3 mg (has no administration in time range)  ondansetron (  ZOFRAN) injection 4 mg (has no administration in time range)  gabapentin (NEURONTIN) capsule 300 mg (300 mg Oral Given 10/06/21 2315)  levothyroxine (SYNTHROID) tablet 75 mcg (has no  administration in time range)  pantoprazole (PROTONIX) EC tablet 40 mg (40 mg Oral Given 10/06/21 2318)  rivaroxaban (XARELTO) tablet 20 mg (20 mg Oral Given 10/06/21 2318)  hydrALAZINE (APRESOLINE) tablet 10 mg (10 mg Oral Given 10/06/21 2139)  sodium chloride 0.9 % bolus 1,000 mL (0 mLs Intravenous Stopped 10/06/21 1403)  vancomycin (VANCOCIN) IVPB 1000 mg/200 mL premix (0 mg Intravenous Stopped 10/06/21 1404)  cefTRIAXone (ROCEPHIN) 2 g in sodium chloride 0.9 % 100 mL IVPB (0 g Intravenous Stopped 10/06/21 1200)    ED Course  I have reviewed the triage vital signs and the nursing notes.  Pertinent labs & imaging results that were available during my care of the patient were reviewed by me and considered in my medical decision making (see chart for details).    MDM Rules/Calculators/A&P                            75yo male with history of atrial fibrillation, DM, hypertension, hyperlipidemia, CKD, who presents with concern for generalized weakness, fever a few days ago, lesion on buttock.  Had a possible syncopal episode versus other episode of altered cognition while in waiting room.  Vital signs normal on arrival to ED.  CBC shows mild anemia. BMP significant for sodium of 126.  Given IV fluids for rehydration. Has cellulitis on exam, at this time do not feel he has abscess with emergent need for ED drainage.  Given abx.  Suspect generalized weakness secondary to hyponatremia and infection.    Final Clinical Impression(s) / ED Diagnoses Final diagnoses:  Hyponatremia  Generalized weakness  Cellulitis of buttock    Rx / DC Orders ED Discharge Orders     None        Gareth Morgan, MD 10/07/21 0004

## 2021-10-06 NOTE — ED Triage Notes (Addendum)
Pt presents to ED from home C/O generalized weakness, multiple falls over the last week. Per spouse, pt had fever about a week ago but none since Monday and pt has a known wound to buttocks.

## 2021-10-06 NOTE — Progress Notes (Signed)
Pharmacy Antibiotic Note  Jeremiah Beasley. is a 75 y.o. male admitted on 10/06/2021 with cellulitis.  Pharmacy has been consulted for vancomycin dosing.  Patient with a history of atrial  fibrillation, hypertension, hyperlipidemia, hypothyroidism, and diabetes mellitus on insulin pump. Patient presenting with generalized weakness, multiple falls over the last week. Spouse reporting fever about a week ago but none since Monday as well as known wound to buttocks.  Patient's serum creatinine is 1.96 which is significantly above baseline. WBC 6.7; afeb  Plan: Vancomycin 1000 mg once given in ED, subsequent dosing as indicated per random vancomycin level until renal function stable and/or improved, at which time scheduled dosing can be considered Trend WBC, fever, renal function, and clinical course Follow-up cultures and de-escalate antibiotics as appropriate.  Height: 5\' 9"  (175.3 cm) Weight: 83.9 kg (185 lb) IBW/kg (Calculated) : 70.7  Temp (24hrs), Avg:98.3 F (36.8 C), Min:98.3 F (36.8 C), Max:98.3 F (36.8 C)  Recent Labs  Lab 10/06/21 0920  WBC 6.7  CREATININE 1.96*    Estimated Creatinine Clearance: 34.1 mL/min (A) (by C-G formula based on SCr of 1.96 mg/dL (H)).    Allergies  Allergen Reactions   Methaqualone Other (See Comments)    unknown   Quinapril Swelling    TONGUE SWELLLING   Antimicrobials this admission: vancomycin 11/19 >>   Microbiology results: Pending  Thank you for allowing pharmacy to be a part of this patient's care.  Lorelei Pont, PharmD, BCPS 10/06/2021 11:13 AM ED Clinical Pharmacist -  [phone]

## 2021-10-06 NOTE — ED Notes (Signed)
Pt reports sore on buttocks area.

## 2021-10-06 NOTE — ED Notes (Signed)
Patient transported to CT & CXR

## 2021-10-06 NOTE — ED Notes (Signed)
ED Provider at bedside. 

## 2021-10-07 LAB — IRON AND TIBC
Iron: 29 ug/dL — ABNORMAL LOW (ref 45–182)
Saturation Ratios: 16 % — ABNORMAL LOW (ref 17.9–39.5)
TIBC: 178 ug/dL — ABNORMAL LOW (ref 250–450)
UIBC: 149 ug/dL

## 2021-10-07 LAB — COMPREHENSIVE METABOLIC PANEL
ALT: 18 U/L (ref 0–44)
AST: 20 U/L (ref 15–41)
Albumin: 2.7 g/dL — ABNORMAL LOW (ref 3.5–5.0)
Alkaline Phosphatase: 64 U/L (ref 38–126)
Anion gap: 7 (ref 5–15)
BUN: 26 mg/dL — ABNORMAL HIGH (ref 8–23)
CO2: 22 mmol/L (ref 22–32)
Calcium: 7.9 mg/dL — ABNORMAL LOW (ref 8.9–10.3)
Chloride: 101 mmol/L (ref 98–111)
Creatinine, Ser: 1.92 mg/dL — ABNORMAL HIGH (ref 0.61–1.24)
GFR, Estimated: 37 mL/min — ABNORMAL LOW (ref >60)
Glucose, Bld: 312 mg/dL — ABNORMAL HIGH (ref 70–99)
Potassium: 4 mmol/L (ref 3.5–5.1)
Sodium: 130 mmol/L — ABNORMAL LOW (ref 135–145)
Total Bilirubin: 0.9 mg/dL (ref 0.3–1.2)
Total Protein: 6.2 g/dL — ABNORMAL LOW (ref 6.5–8.1)

## 2021-10-07 LAB — LACTIC ACID, PLASMA: Lactic Acid, Venous: 0.7 mmol/L (ref 0.5–1.9)

## 2021-10-07 LAB — GLUCOSE, CAPILLARY
Glucose-Capillary: 323 mg/dL — ABNORMAL HIGH (ref 70–99)
Glucose-Capillary: 274 mg/dL — ABNORMAL HIGH (ref 70–99)
Glucose-Capillary: 230 mg/dL — ABNORMAL HIGH (ref 70–99)
Glucose-Capillary: 220 mg/dL — ABNORMAL HIGH (ref 70–99)

## 2021-10-07 LAB — PROCALCITONIN: Procalcitonin: 0.21 ng/mL

## 2021-10-07 LAB — CBC
HCT: 24.3 % — ABNORMAL LOW (ref 39.0–52.0)
Hemoglobin: 8.1 g/dL — ABNORMAL LOW (ref 13.0–17.0)
MCH: 29.9 pg (ref 26.0–34.0)
MCHC: 33.3 g/dL (ref 30.0–36.0)
MCV: 89.7 fL (ref 80.0–100.0)
Platelets: 196 10*3/uL (ref 150–400)
RBC: 2.71 MIL/uL — ABNORMAL LOW (ref 4.22–5.81)
RDW: 13 % (ref 11.5–15.5)
WBC: 3.6 10*3/uL — ABNORMAL LOW (ref 4.0–10.5)
nRBC: 0 % (ref 0.0–0.2)

## 2021-10-07 LAB — RETICULOCYTES
Immature Retic Fract: 19.9 % — ABNORMAL HIGH (ref 2.3–15.9)
RBC.: 2.73 MIL/uL — ABNORMAL LOW (ref 4.22–5.81)
Retic Count, Absolute: 52.1 10*3/uL (ref 19.0–186.0)
Retic Ct Pct: 1.9 % (ref 0.4–3.1)

## 2021-10-07 LAB — TSH: TSH: 5.005 u[IU]/mL — ABNORMAL HIGH (ref 0.350–4.500)

## 2021-10-07 LAB — FERRITIN: Ferritin: 73 ng/mL (ref 24–336)

## 2021-10-07 LAB — T4, FREE: Free T4: 1.13 ng/dL — ABNORMAL HIGH (ref 0.61–1.12)

## 2021-10-07 LAB — CK: Total CK: 32 U/L — ABNORMAL LOW (ref 49–397)

## 2021-10-07 LAB — PHOSPHORUS: Phosphorus: 3.1 mg/dL (ref 2.5–4.6)

## 2021-10-07 LAB — MAGNESIUM: Magnesium: 1.9 mg/dL (ref 1.7–2.4)

## 2021-10-07 MED ORDER — INSULIN GLARGINE-YFGN 100 UNIT/ML ~~LOC~~ SOLN
10.0000 [IU] | Freq: Every day | SUBCUTANEOUS | Status: DC
  Administered 2021-10-07 – 2021-10-08 (×2): 10 [IU] via SUBCUTANEOUS
  Filled 2021-10-07 (×2): qty 0.1

## 2021-10-07 MED ORDER — OXYCODONE HCL 5 MG PO TABS
5.0000 mg | ORAL_TABLET | Freq: Four times a day (QID) | ORAL | Status: DC | PRN
  Administered 2021-10-07: 5 mg via ORAL
  Filled 2021-10-07: qty 1

## 2021-10-07 MED ORDER — RIVAROXABAN 15 MG PO TABS
15.0000 mg | ORAL_TABLET | Freq: Every day | ORAL | Status: DC
  Administered 2021-10-07 – 2021-10-08 (×2): 15 mg via ORAL
  Filled 2021-10-07 (×2): qty 1

## 2021-10-07 MED ORDER — HYDROMORPHONE HCL 1 MG/ML IJ SOLN: 0.5000 mg | INTRAMUSCULAR | Status: DC | PRN

## 2021-10-07 MED ORDER — INSULIN ASPART 100 UNIT/ML IJ SOLN
4.0000 [IU] | Freq: Three times a day (TID) | INTRAMUSCULAR | Status: DC
  Administered 2021-10-08 – 2021-10-10 (×7): 4 [IU] via SUBCUTANEOUS

## 2021-10-07 NOTE — Evaluation (Signed)
Occupational Therapy Evaluation Patient Details Name: Jeremiah Beasley. MRN: [id number] DOB: 12/14/1945 Today's Date: 10/07/2021   History of Present Illness Pt admitted from home with c/o weakness and falls and dx with hyponatremia.  Pt with hx of DM, p-af, diabetic polyneuropathy, and chronic R knee pain (scope 04/25/21).   Clinical Impression   Jeremiah Beasley presents with decreased activity tolerance, generalized weakness, and impaired balance resulting in a decline in functional abilities. For the last two weeks he needed significant assistance from family for ambulation and ADLs and had 5 falls at home. Patient overall min assist for ambulation in hallway with walker and min-mod assist for ADLs. Patient will benefit from skilled OT services while in hospital to improve deficits and learn compensatory strategies as needed in order to reduce caregiver burden.       Recommendations for follow up therapy are one component of a multi-disciplinary discharge planning process, led by the attending physician.  Recommendations may be updated based on patient status, additional functional criteria and insurance authorization.   Follow Up Recommendations  Home health OT    Assistance Recommended at Discharge Intermittent Supervision/Assistance  Functional Status Assessment  Patient has had a recent decline in their functional status and demonstrates the ability to make significant improvements in function in a reasonable and predictable amount of time.  Equipment Recommendations  None recommended by OT    Recommendations for Other Services       Precautions / Restrictions Precautions Precautions: Fall Restrictions Weight Bearing Restrictions: No      Mobility Bed Mobility Overal bed mobility: Needs Assistance Bed Mobility: Supine to Sit     Supine to sit: Min guard;HOB elevated     General bed mobility comments: increased time with extensive use of bed rails but no physical  assist    Transfers Overall transfer level: Needs assistance Equipment used: Rolling walker (2 wheels) Transfers: Sit to/from Stand Sit to Stand: Min assist           General transfer comment: steady assist only with cues for use of UEs to self assist      Balance Overall balance assessment: Needs assistance Sitting-balance support: Feet supported;No upper extremity supported Sitting balance-Leahy Scale: Good     Standing balance support: Bilateral upper extremity supported Standing balance-Leahy Scale: Fair Standing balance comment: reliant on walker with ambulaton                           ADL either performed or assessed with clinical judgement   ADL Overall ADL's : Needs assistance/impaired Eating/Feeding: Independent   Grooming: Set up;Sitting   Upper Body Bathing: Set up;Sitting   Lower Body Bathing: Moderate assistance;Sitting/lateral leans   Upper Body Dressing : Set up;Minimal assistance   Lower Body Dressing: Maximal assistance;Sit to/from stand   Toilet Transfer: Minimal assistance;Rolling walker (2 wheels);Regular Toilet;Comfort height toilet   Toileting- Clothing Manipulation and Hygiene: Moderate assistance;Sit to/from stand   Tub/ Banker: Shower seat;Minimal assistance   Functional mobility during ADLs: Minimal assistance;Rolling walker (2 wheels)       Vision   Vision Assessment?: No apparent visual deficits     Perception     Praxis      Pertinent Vitals/Pain Pain Assessment: Faces Faces Pain Scale: Hurts little more Pain Location: buttock wound Pain Descriptors / Indicators: Grimacing;Sore Pain Intervention(s): Limited activity within patient's tolerance     Hand Dominance Right   Extremity/Trunk Assessment Upper Extremity Assessment Upper  Extremity Assessment: RUE deficits/detail;LUE deficits/detail RUE Deficits / Details: WFL ROm, 5/5 strength RUE Sensation: WNL LUE Deficits / Details: WFL ROm, 5/5  strength LUE Sensation: WNL LUE Coordination: WNL   Lower Extremity Assessment Lower Extremity Assessment: Defer to PT evaluation RLE Sensation: history of peripheral neuropathy LLE Sensation: history of peripheral neuropathy   Cervical / Trunk Assessment Cervical / Trunk Assessment: Normal   Communication Communication Communication: No difficulties   Cognition Arousal/Alertness: Awake/alert Behavior During Therapy: WFL for tasks assessed/performed Overall Cognitive Status: Within Functional Limits for tasks assessed                                       General Comments       Exercises     Shoulder Instructions      Home Living Family/patient expects to be discharged to:: Private residence Living Arrangements: Spouse/significant other Available Help at Discharge: Family;Available 24 hours/day Type of Home: House Home Access: Stairs to enter [employer] of Steps: 1+1+1   Home Layout: Two level;Able to live on main level with bedroom/bathroom     Bathroom Shower/Tub: Occupational psychologist: Handicapped height Bathroom Accessibility: Yes How Accessible: Accessible via walker Home Equipment: [place] (2 wheels);Cane - single point          Prior Functioning/Environment Prior Level of Function : Needs assist             Mobility Comments: has been needing walker and steadying assist for last two weaks. 5 falls. ADLs Comments: assistance with LB ADLs and bathing. min assist for UB        OT Problem List: Impaired balance (sitting and/or standing);Decreased activity tolerance;Decreased knowledge of use of DME or AE;Pain      OT Treatment/Interventions: Self-care/ADL training;Therapeutic exercise;DME and/or AE instruction;Therapeutic activities;Balance training;Patient/family education    OT Goals(Current goals can be found in the care plan section) Acute Rehab OT Goals Patient Stated Goal: improve functional  abilities and balance OT Goal Formulation: With family Time For Goal Achievement: 10/21/21 Potential to Achieve Goals: Good  OT Frequency: Min 2X/week   Barriers to D/C:            Co-evaluation              AM-PAC OT "6 Clicks" Daily Activity     Outcome Measure Help from another person eating meals?: None Help from another person taking care of personal grooming?: A Little Help from another person toileting, which includes using toliet, bedpan, or urinal?: A Lot Help from another person bathing (including washing, rinsing, drying)?: A Little Help from another person to put on and taking off regular upper body clothing?: A Little Help from another person to put on and taking off regular lower body clothing?: A Lot 6 Click Score: 17   End of Session Equipment Utilized During Treatment: Gait belt;Rolling walker (2 wheels) Nurse Communication: Mobility status  Activity Tolerance: Patient tolerated treatment well Patient left: in chair;with call bell/phone within reach;with chair alarm set;with family/visitor present  OT Visit Diagnosis: Unsteadiness on feet (R26.81)                Time: 7026-3785 OT Time Calculation (min): 32 min Charges:  OT General Charges $OT Visit: 1 Visit OT Evaluation $OT Eval Low Complexity: 1 Low  Johnson Arizola, OTR/L [place]  Office [phone] Pager: [phone]   Lenward Chancellor 10/07/2021,  2:39 PM

## 2021-10-07 NOTE — Evaluation (Signed)
Physical Therapy Evaluation Patient Details Name: Jeremiah Beasley. MRN: [id number] DOB: 09-25-1946 Today's Date: 10/07/2021  History of Present Illness  Pt admitted from home with c/o weakness and falls and dx with hyponatremia.  Pt with hx of DM, p-af, diabetic polyneuropathy, and chronic R knee pain (scope 04/25/21).  Clinical Impression  Pt admitted as above and presenting with functional mobility limitations 2* generalized weakness, limited endurance and significant ambulatory balance deficits.  Pt would benefit from follow up HHPT to further address deficits.     Recommendations for follow up therapy are one component of a multi-disciplinary discharge planning process, led by the attending physician.  Recommendations may be updated based on patient status, additional functional criteria and insurance authorization.  Follow Up Recommendations Home health PT    Assistance Recommended at Discharge Frequent or constant Supervision/Assistance  Functional Status Assessment Patient has had a recent decline in their functional status and demonstrates the ability to make significant improvements in function in a reasonable and predictable amount of time.  Equipment Recommendations  None recommended by PT    Recommendations for Other Services       Precautions / Restrictions Precautions Precautions: Fall Restrictions Weight Bearing Restrictions: No      Mobility  Bed Mobility Overal bed mobility: Needs Assistance Bed Mobility: Supine to Sit     Supine to sit: Min guard;HOB elevated     General bed mobility comments: increased time with extensive use of bed rails but no physical assist    Transfers Overall transfer level: Needs assistance Equipment used: Rolling walker (2 wheels) Transfers: Sit to/from Stand Sit to Stand: Min assist           General transfer comment: steady assist only with cues for use of UEs to self assist    Ambulation/Gait Ambulation/Gait  assistance: Min assist;Min guard Gait Distance (Feet): 120 Feet Assistive device: Rolling walker (2 wheels) Gait Pattern/deviations: Step-through pattern;Decreased step length - right;Decreased step length - left;Shuffle;Trunk flexed;Narrow base of support Gait velocity: decreased     General Gait Details: cues for posture, position from RW, stride length and to increase BOS  Stairs            Wheelchair Mobility    Modified Rankin (Stroke Patients Only)       Balance Overall balance assessment: Needs assistance Sitting-balance support: Feet supported;No upper extremity supported Sitting balance-Leahy Scale: Good     Standing balance support: Bilateral upper extremity supported Standing balance-Leahy Scale: Poor                               Pertinent Vitals/Pain Pain Assessment: Faces Faces Pain Scale: Hurts little more Pain Location: buttock wound Pain Descriptors / Indicators: Grimacing;Sore Pain Intervention(s): Limited activity within patient's tolerance;Monitored during session;Repositioned    Home Living Family/patient expects to be discharged to:: Private residence Living Arrangements: Spouse/significant other Available Help at Discharge: Family;Available 24 hours/day Type of Home: House Home Access: Stairs to enter   [employer] of Steps: 1+1+1   Home Layout: Two level;Able to live on main level with bedroom/bathroom Home Equipment: Rolling Walker (2 wheels);Cane - single point      Prior Function Prior Level of Function : Needs assist             Mobility Comments: has been needing walker and steadying assist for last two weaks. 5 falls. ADLs Comments: assistance with LB ADLs and bathing. min assist for UB  Hand Dominance   Dominant Hand: Right    Extremity/Trunk Assessment   Upper Extremity Assessment Upper Extremity Assessment: Defer to OT evaluation    Lower Extremity Assessment Lower Extremity  Assessment: RLE deficits/detail;LLE deficits/detail;Generalized weakness RLE Sensation: history of peripheral neuropathy LLE Sensation: history of peripheral neuropathy    Cervical / Trunk Assessment Cervical / Trunk Assessment: Normal  Communication   Communication: No difficulties  Cognition Arousal/Alertness: Awake/alert Behavior During Therapy: WFL for tasks assessed/performed Overall Cognitive Status: Within Functional Limits for tasks assessed                                          General Comments      Exercises     Assessment/Plan    PT Assessment Patient needs continued PT services  PT Problem List Decreased strength;Decreased range of motion;Decreased activity tolerance;Decreased balance;Decreased mobility;Decreased knowledge of use of DME;Decreased safety awareness       PT Treatment Interventions DME instruction;Gait training;Stair training;Functional mobility training;Therapeutic activities;Therapeutic exercise;Balance training;Patient/family education    PT Goals (Current goals can be found in the Care Plan section)  Acute Rehab PT Goals Patient Stated Goal: Regain IND and not fall any more PT Goal Formulation: With patient Time For Goal Achievement: 10/21/21 Potential to Achieve Goals: Good    Frequency Min 3X/week   Barriers to discharge        Co-evaluation               AM-PAC PT "6 Clicks" Mobility  Outcome Measure Help needed turning from your back to your side while in a flat bed without using bedrails?: None Help needed moving from lying on your back to sitting on the side of a flat bed without using bedrails?: A Little Help needed moving to and from a bed to a chair (including a wheelchair)?: A Little Help needed standing up from a chair using your arms (e.g., wheelchair or bedside chair)?: A Little Help needed to walk in hospital room?: A Little Help needed climbing 3-5 steps with a railing? : A Little 6 Click  Score: 19    End of Session Equipment Utilized During Treatment: Gait belt Activity Tolerance: Patient tolerated treatment well;Patient limited by fatigue Patient left: in chair;with call bell/phone within reach;with chair alarm set;with family/visitor present Nurse Communication: Mobility status PT Visit Diagnosis: Difficulty in walking, not elsewhere classified (R26.2)    Time: 4132-4401 PT Time Calculation (min) (ACUTE ONLY): 31 min   Charges:   PT Evaluation $PT Eval Low Complexity: 1 Low          [place] Pager [phone] Office [phone]   [place] 10/07/2021, 12:58 PM

## 2021-10-07 NOTE — Consult Note (Signed)
[place] Nurse Consult Note: Reason for Consult:Right buttock lesion.  Wife reported that area began as a "boil" and never resolved.  Wound type: infectious, full thickness. Patient now on vancomycin. Bedside RN and DFr. [place] Crane are both assisting in the development of the POC. Pressure Injury POA: N/A Measurement: 3cm round area of induration, erythema. No drainage. Wound bed: N/A Drainage (amount, consistency, odor) As noted above Periwound: As noted above Dressing procedure/placement/frequency: I will provide Nursing with guidance for a nonadherent antimicrobial and topper dressing. As noted above, he is on systemic antibiotics.  No fever for 1 week.  Recommend if no improvement in 24-48 hours with current regimen that a general surgery consult be placed to rule out abscess.  [place] nursing team will not follow, but will remain available to this patient, the nursing and medical teams.  Please re-consult if needed. Thanks, Maudie Flakes, MSN, RN, [place], Arther Abbott  Pager# [phone]

## 2021-10-07 NOTE — Progress Notes (Addendum)
PROGRESS NOTE  Jeremiah Beasley. BWI:[id number] DOB: [birth year] DOA: 10/06/2021 PCP: [name], MD  HPI/Recap of past 24 hours:  Jeremiah Beasley. is a 75 y.o. male with medical history significant for paroxysmal atrial fibrillation on Xarelto, transient bradycardia followed by cardiology, type 2 diabetes on insulin pump, diabetic polyneuropathy, GERD, essential hypertension, hyperlipidemia, hypothyroidism, intolerance to ACE inhibitor due to tongue swelling, who presented to [facility] ED, brought in by his wife and son due to generalized weakness and multiple falls, 5 times this week, including 1 fall prior to presenting to [facility] ED.  Associated with poor oral intake, a wound on his right buttock present for 2 months, intermittent fevers at home with T-max 101.2 a few days ago.  Per his son at bedside his weakness has rapidly progressed for the past 2 weeks to the point where he shakes when he stands up due to his generalized weakness.  Has had unintentional weight loss, 24 pounds in the last 3 months due to lack of appetite and poor oral intake.     Per his wife at bedside his decline started almost a year ago after his gallbladder surgery.  He had a prolonged hospital stay and since then he has progressively declined.  Went from ambulating with a cane to needing a walker to ambulate short distances.     Upon presentation to the ED, patient appears weak and severely debilitated.  Per his son at bedside, he had an episode of staring off and gurgling while in the waiting room in the ED, unclear if the patient had a syncopal episode.  There were no witnessed seizure-like activity.     Work-up in the ED revealed hypovolemic hyponatremia and AKI, likely prerenal.  EDP requested admission for further evaluation and management of present condition.  Patient was admitted as a direct admit from [facility] ED to [facility] progressive care unit by Dr. Olevia Bowens, from [facility], Hospitalist service.   ED Course:  Temperature 98.3.  BP 152/62, pulse 64, respiration rate 14, O2 saturation 96% on room air.  Lab studies remarkable for serum sodium 126, chloride 94, glucose 154, BUN 28, creatinine 1.96, GFR 36.  Lactic acid 2.7.  WBC 9.1.  Hemoglobin 10.2.  Platelet 177.  UA few bacteria, WBC 0-5.  Right buttock wound.  10/07/2021: Patient was seen and examined at his bedside.  He feels better today he is more alert and interactive.  Complains of pain in his right buttock from cellulitis and wound.  He is currently on linezolid and Rocephin.  Assessment/Plan: Principal Problem:   Hyponatremia Active Problems:   Generalized weakness  Generalized weakness and recurrent falls, unclear etiology Reported 5 falls this week, 1 at home, prior to presenting to [facility] ED Obtain orthostatic vital signs. TSH 5.005, follow-up free T4.   Continue gentle IV fluid hydration normal saline at 50 cc/h x 1 day. Presented with a right buttock wound/cellulitis, present for 2 months. Continue to treat empirically with linezolid and Rocephin, history of MRSA- Continue Florastor 250 mg twice daily. Continue to follow urine culture, blood cultures x2 peripherally. Closely monitor fever curve and WBC Replete electrolytes as indicated Treat underlying conditions Evaluated by PT OT with recommendation for home health PT OT. TOC consulted to assist with home health PT OT arrangement. Continue fall precautions  Transient bradycardia Hold off Norvasc, digoxin and amiodarone for now, avoid AV nodal blockade agents until seen by cardiology.   Discussed with cardiology, will see in consultation.  Anemia  of chronic disease/chronic normocytic anemia in the setting of CKD 3B/chronic wound/iron deficiency anemia. Hemoglobin 10.2K, downtrending 8.1K Monitor H&H, repeat CBC in the morning, if hemoglobin low will consult GI. Follow iron studies.   Improving, hypovolemic hyponatremia Presented with serum sodium of 126, repeat 129. Serum  sodium 130 on 10/07/2021. Hypovolemic on presentation Continue normal saline at 50 cc/h x 1 day Continue to closely monitor volume status while on IV fluid. Repeat BMP in the morning   Right buttock cellulitis/wound, POA Wound care specialist consulted Continue local wound care. Continue broad-spectrum IV antibiotics Follow MRSA screen   Improving AKI on CKD 3B likely prerenal in the setting of dehydration and poor oral intake Baseline creatinine appears to be 1.51 with GFR of 52 Creatinine downtrending 1.92 from 2.07 Continue to avoid nephrotoxic agents, dehydration and hypotension Continue to closely monitor urine output with strict I's and O's Continue IV fluid hydration x1 day. Repeat BMP in the morning.   Type 2 diabetes on insulin pump with hyperglycemia Obtain hemoglobin A1c Serum glucose 169 Diabetes coordinator Hold off insulin pump for now Continue insulin sliding scale. Added Semglee 10 unit daily Add NovoLog 4 units 3 times daily   Diabetic polyneuropathy Continue home gabapentin   Paroxysmal A. fib on Xarelto Prior to admission on digoxin and amiodarone, hold off due to bradycardia. Continue to closely monitor on telemetry.   Essential hypertension BP stable. Continue home hydralazine at lower dose 10 mg 3 times daily Continue to closely monitor vital signs   Hyperlipidemia Follow CPK results prior to restarting home Zocor   Hypothyroidism TSH 5, follow-up free T4. Continue home levothyroxine   GERD Continue home PPI   Resolved elevated lactic acid On presentation lactic acid 2.7 Received 1 L IV fluid bolus in the ED Repeat lactic acid post IV fluid hydration normalized 0.9.       DVT prophylaxis: Xarelto   Code Status: Full code   Family Communication: Updated patient's wife via phone.   Disposition Plan: Admitted to progressive unit   Consults called: Cardiology   Admission status: Inpatient status.     Status is: Inpatient    Inpatient status.  Patient will require at least 2 midnights for further evaluation and treatment of present condition.       Objective: Vitals:   10/07/21 0329 10/07/21 0953 10/07/21 1023 10/07/21 1303  BP:    (!) 143/57  Pulse:    (!) 55  Resp:  18 18 14   Temp:    97.7 F (36.5 C)  TempSrc:    Oral  SpO2:    90%  Weight: 85.1 kg     Height:        Intake/Output Summary (Last 24 hours) at 10/07/2021 1435 Last data filed at 10/07/2021 1300 Gross per 24 hour  Intake 571.16 ml  Output 1750 ml  Net -1178.84 ml   Filed Weights   10/06/21 0917 10/06/21 1610 10/07/21 0329  Weight: 83.9 kg 81.9 kg 85.1 kg    Exam:  General: 75 y.o. year-old male well-developed well-nourished in no acute distress.  He is alert and oriented x3.   Cardiovascular: Regular rate and rhythm no rubs or gallops.  Respiratory: Clear to auscultation no wheezes or rales.  Abdomen: Soft nontender normal bowel sounds present.  Musculoskeletal: No lower extremity edema bilaterally.   Skin: No ulcerative lesions noted.   Psychiatry: Mood is appropriate for condition and setting.  Data Reviewed: CBC: Recent Labs  Lab 10/06/21 0920 10/06/21 1216 10/06/21  1653 10/07/21 0446  WBC 6.7 9.1 4.6 3.6*  NEUTROABS  --  7.3 3.7  --   HGB 10.1* 10.2* 8.4* 8.1*  HCT 29.9* 31.2* 25.6* 24.3*  MCV 89.0 90.2 90.1 89.7  PLT 255 177 186 416   Basic Metabolic Panel: Recent Labs  Lab 10/06/21 0920 10/06/21 1216 10/07/21 0446  NA 126* 129* 130*  K 3.6 4.0 4.0  CL 94* 94* 101  CO2 23 25 22   GLUCOSE 154* 169* 312*  BUN 28* 27* 26*  CREATININE 1.96* 2.07* 1.92*  CALCIUM 8.4* 8.6* 7.9*  MG  --   --  1.9  PHOS  --   --  3.1   GFR: Estimated Creatinine Clearance: 37.6 mL/min (A) (by C-G formula based on SCr of 1.92 mg/dL (H)). Liver Function Tests: Recent Labs  Lab 10/06/21 1216 10/07/21 0446  AST 30 20  ALT 25 18  ALKPHOS 84 64  BILITOT 0.6 0.9  PROT 7.8 6.2*  ALBUMIN 3.5 2.7*   No results for  input(s): LIPASE, AMYLASE in the last 168 hours. Recent Labs  Lab 10/06/21 1216  AMMONIA 14   Coagulation Profile: Recent Labs  Lab 10/06/21 1216  INR 2.2*   Cardiac Enzymes: Recent Labs  Lab 10/07/21 0446  CKTOTAL 32*   BNP (last 3 results) No results for input(s): PROBNP in the last 8760 hours. HbA1C: No results for input(s): HGBA1C in the last 72 hours. CBG: Recent Labs  Lab 10/06/21 0949 10/06/21 1510 10/06/21 2014 10/07/21 0740 10/07/21 1123  GLUCAP 160* 159* 181* 323* 274*   Lipid Profile: No results for input(s): CHOL, HDL, LDLCALC, TRIG, CHOLHDL, LDLDIRECT in the last 72 hours. Thyroid Function Tests: Recent Labs    10/07/21 0446  TSH 5.005*   Anemia Panel: No results for input(s): VITAMINB12, FOLATE, FERRITIN, TIBC, IRON, RETICCTPCT in the last 72 hours. Urine analysis:    Component Value Date/Time   COLORURINE YELLOW 10/06/2021 [place] 10/06/2021 1216   LABSPEC 1.010 10/06/2021 1216   PHURINE 5.5 10/06/2021 1216   GLUCOSEU NEGATIVE 10/06/2021 1216   HGBUR TRACE (A) 10/06/2021 1216   BILIRUBINUR NEGATIVE 10/06/2021 1216   [place] 10/06/2021 1216   PROTEINUR NEGATIVE 10/06/2021 1216   NITRITE NEGATIVE 10/06/2021 1216   LEUKOCYTESUR NEGATIVE 10/06/2021 1216   Sepsis Labs: @LABRCNTIP (procalcitonin:4,lacticidven:4)  ) Recent Results (from the past 240 hour(s))  Resp Panel by RT-PCR (Flu A&B, Covid) Nasopharyngeal Swab     Status: None   Collection Time: 10/06/21 10:14 AM   Specimen: Nasopharyngeal Swab; Nasopharyngeal(NP) swabs in vial transport medium  Result Value Ref Range Status   SARS Coronavirus 2 by RT PCR NEGATIVE NEGATIVE Final    Comment: (NOTE) SARS-CoV-2 target nucleic acids are NOT DETECTED.  The SARS-CoV-2 RNA is generally detectable in upper respiratory specimens during the acute phase of infection. The lowest concentration of SARS-CoV-2 viral copies this assay can detect is 138 copies/mL. A  negative result does not preclude SARS-Cov-2 infection and should not be used as the sole basis for treatment or other patient management decisions. A negative result may occur with  improper specimen collection/handling, submission of specimen other than nasopharyngeal swab, presence of viral mutation(s) within the areas targeted by this assay, and inadequate number of viral copies(<138 copies/mL). A negative result must be combined with clinical observations, patient history, and epidemiological information. The expected result is Negative.  Fact Sheet for Patients:  EntrepreneurPulse.com.au  Fact Sheet for Healthcare Providers:  IncredibleEmployment.be  This test is  no t yet approved or cleared by the [place] and  has been authorized for detection and/or diagnosis of SARS-CoV-2 by FDA under an Emergency Use Authorization (EUA). This EUA will remain  in effect (meaning this test can be used) for the duration of the COVID-19 declaration under Section 564(b)(1) of the Act, 21 U.S.C.section 360bbb-3(b)(1), unless the authorization is terminated  or revoked sooner.       Influenza A by PCR NEGATIVE NEGATIVE Final   Influenza B by PCR NEGATIVE NEGATIVE Final    Comment: (NOTE) The Xpert Xpress SARS-CoV-2/FLU/RSV plus assay is intended as an aid in the diagnosis of influenza from Nasopharyngeal swab specimens and should not be used as a sole basis for treatment. Nasal washings and aspirates are unacceptable for Xpert Xpress SARS-CoV-2/FLU/RSV testing.  Fact Sheet for Patients: EntrepreneurPulse.com.au  Fact Sheet for Healthcare Providers: IncredibleEmployment.be  This test is not yet approved or cleared by the [place] FDA and has been authorized for detection and/or diagnosis of SARS-CoV-2 by FDA under an Emergency Use Authorization (EUA). This EUA will remain in effect (meaning this test can  be used) for the duration of the COVID-19 declaration under Section 564(b)(1) of the Act, 21 U.S.C. section 360bbb-3(b)(1), unless the authorization is terminated or revoked.  Performed at [facility], [address] [place], [place] [address]   Urine Culture     Status: Abnormal (Preliminary result)   Collection Time: 10/06/21 12:16 PM   Specimen: In/Out Cath Urine  Result Value Ref Range Status   Specimen Description   Final    IN/OUT CATH URINE Performed at [facility], [address] [place], [place] [address]    Special Requests   Final    NONE Performed at [facility], [address] [place], [place] [address]    Culture (A)  Final    1,000 COLONIES/mL ENTEROCOCCUS FAECALIS SUSCEPTIBILITIES TO FOLLOW Performed at [facility], [place] [address] [place], [place] [address]    Report Status PENDING  Incomplete      Studies: No results found.  Scheduled Meds:  gabapentin  300 mg Oral BID   hydrALAZINE  10 mg Oral Q8H   insulin aspart  0-5 Units Subcutaneous QHS   insulin aspart  0-9 Units Subcutaneous TID WC   insulin glargine-yfgn  10 Units Subcutaneous Daily   levothyroxine  75 mcg Oral Q0600   pantoprazole  40 mg Oral Daily   rivaroxaban  15 mg Oral q1800   saccharomyces boulardii  250 mg Oral BID    Continuous Infusions:  sodium chloride 50 mL/hr at 10/07/21 0705   cefTRIAXone (ROCEPHIN)  IV 2 g (10/07/21 1058)   linezolid (ZYVOX) IV 600 mg (10/07/21 0954)     LOS: 1 day     Kayleen Memos, MD [facility] Pager [phone]  If 7PM-7AM, please contact night-coverage [email or web address] Password [facility] 10/07/2021, 2:35 PM

## 2021-10-08 DIAGNOSIS — I480 Paroxysmal atrial fibrillation: Secondary | ICD-10-CM

## 2021-10-08 DIAGNOSIS — R001 Bradycardia, unspecified: Secondary | ICD-10-CM

## 2021-10-08 LAB — CBC WITH DIFFERENTIAL/PLATELET
Abs Immature Granulocytes: 0.02 10*3/uL (ref 0.00–0.07)
Basophils Absolute: 0 10*3/uL (ref 0.0–0.1)
Basophils Relative: 1 %
Eosinophils Absolute: 0.1 10*3/uL (ref 0.0–0.5)
Eosinophils Relative: 4 %
HCT: 23.5 % — ABNORMAL LOW (ref 39.0–52.0)
Hemoglobin: 7.7 g/dL — ABNORMAL LOW (ref 13.0–17.0)
Immature Granulocytes: 1 %
Lymphocytes Relative: 21 %
Lymphs Abs: 0.7 10*3/uL (ref 0.7–4.0)
MCH: 30.3 pg (ref 26.0–34.0)
MCHC: 32.8 g/dL (ref 30.0–36.0)
MCV: 92.5 fL (ref 80.0–100.0)
Monocytes Absolute: 0.4 10*3/uL (ref 0.1–1.0)
Monocytes Relative: 10 %
Neutro Abs: 2.3 10*3/uL (ref 1.7–7.7)
Neutrophils Relative %: 63 %
Platelets: 191 10*3/uL (ref 150–400)
RBC: 2.54 MIL/uL — ABNORMAL LOW (ref 4.22–5.81)
RDW: 13.1 % (ref 11.5–15.5)
WBC: 3.6 10*3/uL — ABNORMAL LOW (ref 4.0–10.5)
nRBC: 0 % (ref 0.0–0.2)

## 2021-10-08 LAB — BASIC METABOLIC PANEL WITH GFR
Anion gap: 5 (ref 5–15)
BUN: 25 mg/dL — ABNORMAL HIGH (ref 8–23)
CO2: 24 mmol/L (ref 22–32)
Calcium: 7.9 mg/dL — ABNORMAL LOW (ref 8.9–10.3)
Chloride: 103 mmol/L (ref 98–111)
Creatinine, Ser: 1.94 mg/dL — ABNORMAL HIGH (ref 0.61–1.24)
GFR, Estimated: 36 mL/min — ABNORMAL LOW
Glucose, Bld: 254 mg/dL — ABNORMAL HIGH (ref 70–99)
Potassium: 4.1 mmol/L (ref 3.5–5.1)
Sodium: 132 mmol/L — ABNORMAL LOW (ref 135–145)

## 2021-10-08 LAB — URINE CULTURE: Culture: 1000 — AB

## 2021-10-08 LAB — GLUCOSE, CAPILLARY
Glucose-Capillary: 270 mg/dL — ABNORMAL HIGH (ref 70–99)
Glucose-Capillary: 233 mg/dL — ABNORMAL HIGH (ref 70–99)
Glucose-Capillary: 181 mg/dL — ABNORMAL HIGH (ref 70–99)
Glucose-Capillary: 136 mg/dL — ABNORMAL HIGH (ref 70–99)

## 2021-10-08 LAB — HEMOGLOBIN A1C
Hgb A1c MFr Bld: 6 % — ABNORMAL HIGH (ref 4.8–5.6)
Mean Plasma Glucose: 126 mg/dL

## 2021-10-08 LAB — MAGNESIUM: Magnesium: 2 mg/dL (ref 1.7–2.4)

## 2021-10-08 LAB — CK: Total CK: 22 U/L — ABNORMAL LOW (ref 49–397)

## 2021-10-08 LAB — PHOSPHORUS: Phosphorus: 3.5 mg/dL (ref 2.5–4.6)

## 2021-10-08 MED ORDER — INSULIN GLARGINE-YFGN 100 UNIT/ML ~~LOC~~ SOLN
16.0000 [IU] | Freq: Every day | SUBCUTANEOUS | Status: DC
Start: 2021-10-09 — End: 2021-10-10
  Administered 2021-10-09: 16 [IU] via SUBCUTANEOUS
  Filled 2021-10-08 (×2): qty 0.16

## 2021-10-08 MED ORDER — INSULIN GLARGINE-YFGN 100 UNIT/ML ~~LOC~~ SOLN
10.0000 [IU] | SUBCUTANEOUS | Status: DC
  Filled 2021-10-08: qty 0.1

## 2021-10-08 MED ORDER — INSULIN GLARGINE-YFGN 100 UNIT/ML ~~LOC~~ SOLN
6.0000 [IU] | SUBCUTANEOUS | Status: AC
  Administered 2021-10-08: 6 [IU] via SUBCUTANEOUS
  Filled 2021-10-08: qty 0.06

## 2021-10-08 NOTE — Progress Notes (Addendum)
Inpatient Diabetes Program Recommendations  AACE/ADA: New Consensus Statement on Inpatient Glycemic Control (2015)  Target Ranges:  Prepandial:   less than 140 mg/dL      Peak postprandial:   less than 180 mg/dL (1-2 hours)      Critically ill patients:  140 - 180 mg/dL    Latest Reference Range & Units 10/07/21 07:40 10/07/21 11:23 10/07/21 16:27 10/07/21 19:36  Glucose-Capillary 70 - 99 mg/dL 323 (H)  7 units Novolog  274 (H)  5 units Novolog  10 units Semglee _0  230 (H)  3 units Novolog  220 (H)  2 units Novolog     Latest Reference Range & Units 10/08/21 07:36  Glucose-Capillary 70 - 99 mg/dL 270 (H)  9 units Novolog     Admit with: Generalized weakness and recurrent falls/ Hypovolemic Hyponatremia  History: DM  Home DM Meds: Insulin Pump  Current Orders: Semglee 10 units Daily      Novolog Sensitive Correction Scale/ SSI (0-9 units) TID AC + HS      Novolog 4 units TID with meals   MD- Per last ENDO visit, pt was getting total of 32.1 units insulin for a basal rate (24 hours) on his home insulin pump  Please increase Semglee to 25 units Daily (80% total home basal rate on pump)  Since 10 unit dose already given this AM, please give an extra 15 units Semglee X 1 dose today    Addendum 11:10am--Met w/ pt and wife this AM.  Pt sees Si Raider, pump trainer/diabetes educator for all insulin pump adjustments.  Has appt in December.  Verified w/ pt and wife that no changes have been made to pump settings since last visit (see below for settings).  Pt asked why his CBGs are >200 since he hasn't been eating much--explained to pt and wife that we currently are giving him less basal insulin than he takes at home on his pump and that I have asked the MD to consider increasing the Semglee insulin (explained what Semglee is and how it works) to closer to what pt gets on his pump for a basal rate at home.  Also discussed w/ pt and wife that when pt goes home and resumes  his insulin pump, he will need to know when last dose of Semglee insulin was given so that we don't overlap the Semglee and his basal rates.  Explained how it will be best to restart his insulin pump in the AM (when MD allows) prior to getting AM dose of Semglee (that is being given in the AM).  Wife and pt stated understanding.  Discussed with pt and wife that we will continue to dose his insulin with SQ injections and that MD will need to decide when to allow pt to resume pump (likely when he goes home).  Pt and wife appreciative of visit and did not have any questions at this time.    ENDO: [facility] Last seen 08/09/2021 Insulin Pump Settings were as follows: Insulin: Lispro (Humalog) Pump Brand/Model: Medtronic  Basal Total Daily Basal Insulin: 32.1  Basal Time: 00:00 (1) Basal Rate (Units/Hr): 1.4  Basal Time: 04:00 (2) Basal Rate (Units/Hr): 1.5  Basal Time: 07:00 (3) Basal Rate (Units/Hr): 1.4  Basal Time: 18:00 (4) Basal Rate (Units/Hr): 1.1   Insulin:Carb ICR Time: 00:00 (1) ICR : 9.5 ICR Time: 06:00 (2) ICR : 7 ICR Time: 11:00 (3) ICR: 8  ICR Time: 18:00 (4) ICR: 7.5  ICR Time: 21:00 (5)  ICR: 8.5   Sensitivity ISF Time: 00:00 (1) ISF: 20  Target BG Time: 00:00 (1) Target BG (mg/dL): 140  BG Time: 07:30 (2) Target BG (mg/dL): 130  BG Time: 22:00 (3) Target BG (mg/dL): 140     --Will follow patient during hospitalization--  Wyn Quaker RN, MSN, CDE Diabetes Coordinator Inpatient Glycemic Control Team Team Pager: [phone] (8a-5p)

## 2021-10-08 NOTE — Consult Note (Addendum)
Cardiology Consultation:   Patient ID: Jeremiah Beasley. MRN: [id number]; DOB: 07/29/1946  Admit date: 10/06/2021 Date of Consult: 10/08/2021  PCP:  [name], MD   [facility] Providers Cardiologist:  [name], MD        Patient Profile:   Jeremiah Beasley. is a 75 y.o. male with a hx of PAF, hx bradycardia on amio but tol 100 mg qd, intol ACE w/ tongue swelling, DM, HTN, HLD, R knee pain, CKD III-IV, who is being seen 10/08/2021 for the evaluation of bradycardia at the request of Dr Nevada Crane.  History of Present Illness:   Jeremiah Beasley was admitted 11/19 after gen weakness, multiple falls, fever w/ R buttock wound (x 2 mo), wt loss, debility.  Cards asked to see for bradycardia w/ HR 40s.  On admission, Na 129, BUN/Cr 27/2.07, iron 29, lactic acid 2.7, H&H 10.2/31.2 but has dropped to 7.7/23.5, TSH 5.005, free T4 1.13, urine cx w/ 1000 colonies enterococcus faecalis.   Jeremiah Beasley saw Dr. Agustin Cree in early October.  He was having right flank pain that was not felt cardiac, calcification on his coronary arteries had been noted and Dr. Agustin Cree was going to do a stress test in the future.  He was having sinus bradycardia at that time with a heart rate of 58.  He he wore a monitor for a week after that.  Minimum heart rate was 39, maximum 158 (during a 4 beat run of VT), 18 SVT runs 7 beats or less.,  No A. fib.  Jeremiah Beasley has been getting gradually weaker for a long time.  His wife is aware that his heart rate is slow, she checks it with a pulse ox meter.  His O2 saturation has been greater than 90%, but his heart rate has been in the 40s on a regular basis.  He has gradually been getting weaker and weaker.  He has deteriorated to the point that he cannot get himself out of bed and with assistance can barely walk a few steps with a walker.  He does not remember this.  He states that he does not remember getting lightheaded or dizzy.  He is not aware that his heart rate  is slow and states he was not aware of his atrial for when he was having that.  When he pulled himself forward so I could listen to lung sounds, he took several deep breaths and was exhausted.  For his wife, he has fallen 5 times.  However, he is never complained of presyncope and there has been no syncope.   Past Medical History:  Diagnosis Date   Atrial fibrillation ([place])    Chronic pain of right knee 04/11/2020   Diabetes mellitus without complication ([place])    Essential hypertension 05/15/2015   Hyperlipidemia    Hypertension    MRSA (methicillin resistant staph aureus) culture positive    Paroxysmal atrial fibrillation ([facility]) 05/15/2015   Pneumonia 10/20/2020   Pure hypercholesterolemia 05/15/2015   S/P right knee arthroscopy 04/25/2020   Type 2 diabetes mellitus without complication ([place]) 81/15/7262    Past Surgical History:  Procedure Laterality Date   gall bladder removed   10/20/2020   Iron infusion  05/18/2021   x2   NO PAST SURGERIES       Home Medications:  Prior to Admission medications   Medication Sig Start Date End Date Taking? Authorizing Provider  amiodarone (PACERONE) 200 MG tablet Take 100 mg by mouth daily. 11/12/16  Yes [provider]  amLODipine (NORVASC) 5 MG tablet Take 5 mg by mouth at bedtime.   Yes [provider]  digoxin (LANOXIN) 0.125 MG tablet Take 0.125 mg by mouth every other day.   Yes [provider]  diphenhydrAMINE (BENADRYL) 25 mg capsule Take 25 mg by mouth at bedtime.   Yes [provider]  ezetimibe (ZETIA) 10 MG tablet Take 1 tablet (10 mg total) by mouth daily. 06/08/20 11/05/21 Yes Park Liter, MD  furosemide (LASIX) 20 MG tablet TAKE ONE (1) TABLET BY MOUTH EVERY DAY. Patient taking differently: Take 20 mg by mouth in the morning. 04/02/21  Yes Park Liter, MD  gabapentin (NEURONTIN) 300 MG capsule Take 1 capsule (300 mg total) by mouth 2 (two) times daily. 09/24/21  Yes Patel,  Donika K, DO  hydrALAZINE (APRESOLINE) 100 MG tablet Take 100 mg by mouth 3 (three) times daily. 01/25/21  Yes [provider]  Insulin Human (INSULIN PUMP) SOLN Inject 1 each into the skin continuous.   Yes [provider]  levothyroxine (SYNTHROID, LEVOTHROID) 75 MCG tablet Take 75 mcg by mouth daily before breakfast. 05/22/16  Yes [provider]  Multiple Vitamins-Minerals (CENTRUM SILVER ADULT 50+ PO) Take 1 tablet by mouth daily with breakfast.   Yes [provider]  omeprazole (PRILOSEC) 40 MG capsule Take 40 mg by mouth daily before supper. 02/16/21  Yes [provider]  rivaroxaban (XARELTO) 20 MG TABS tablet Take 20 mg by mouth daily with supper. 10/08/16  Yes [provider]  simvastatin (ZOCOR) 80 MG tablet Take 80 mg by mouth daily. 03/12/19  Yes [provider]  amLODipine (NORVASC) 10 MG tablet Take 1 tablet (10 mg total) by mouth daily. Patient not taking: Reported on 10/06/2021 12/21/20 10/07/21  Park Liter, MD    Inpatient Medications: Scheduled Meds:  gabapentin  300 mg Oral BID   hydrALAZINE  10 mg Oral Q8H   insulin aspart  0-5 Units Subcutaneous QHS   insulin aspart  0-9 Units Subcutaneous TID WC   insulin aspart  4 Units Subcutaneous TID WC   insulin glargine-yfgn  10 Units Subcutaneous Daily   levothyroxine  75 mcg Oral Q0600   pantoprazole  40 mg Oral Daily   rivaroxaban  15 mg Oral q1800   saccharomyces boulardii  250 mg Oral BID   Continuous Infusions:  cefTRIAXone (ROCEPHIN)  IV Stopped (10/07/21 1128)   linezolid (ZYVOX) IV 600 mg (10/07/21 2114)   PRN Meds: acetaminophen, HYDROmorphone (DILAUDID) injection, melatonin, ondansetron (ZOFRAN) IV, oxyCODONE, polyethylene glycol  Allergies:    Allergies  Allergen Reactions   Quinapril Shortness Of Breath, Swelling and Other (See Comments)    TONGUE SWELLLING   Methaqualone Other (See Comments)    Unknown to patient   Tape Rash and Other (See  Comments)    Skin is sensitive!!    Social History:   Social History   Socioeconomic History   Marital status: Married    Spouse name: Not on file   Number of children: Not on file   Years of education: Not on file   Highest education level: Not on file  Occupational History   Not on file  Tobacco Use   Smoking status: Never   Smokeless tobacco: Current  Vaping Use   Vaping Use: Never used  Substance and Sexual Activity   Alcohol use: No   Drug use: No   Sexual activity: Not on file  Other Topics Concern   Not  on file  Social History Narrative   Likes bird/deer watching, grand children   Social Determinants of Health   Financial Resource Strain: Low Risk    Difficulty of Paying Living Expenses: Not hard at all  Food Insecurity: No Food Insecurity   Worried About Charity fundraiser in the Last Year: Never true   Arboriculturist in the Last Year: Never true  Transportation Needs: No Transportation Needs   Lack of Transportation (Medical): No   Lack of Transportation (Non-Medical): No  Physical Activity: Not on file  Stress: No Stress Concern Present   Feeling of Stress : Only a little  Social Connections: Not on file  Intimate Partner Violence: Not At Risk   Fear of Current or Ex-Partner: No   Emotionally Abused: No   Physically Abused: No   Sexually Abused: No    Family History:   Family History  Problem Relation Age of Onset   Heart attack Father    Hyperlipidemia Father    Stroke Father      ROS:  Please see the history of present illness.  All other ROS reviewed and negative.     Physical Exam/Data:   Vitals:   10/07/21 0953 10/07/21 1023 10/07/21 1303 10/08/21 0500  BP:   (!) 143/57   Pulse:   (!) 55   Resp: 18 18 14    Temp:   97.7 F (36.5 C)   TempSrc:   Oral   SpO2:   90%   Weight:    85.3 kg  Height:        Intake/Output Summary (Last 24 hours) at 10/08/2021 0759 Last data filed at 10/08/2021 0500 Gross per 24 hour  Intake 1074.29  ml  Output 1450 ml  Net -375.71 ml   Last 3 Weights 10/08/2021 10/07/2021 10/06/2021  Weight (lbs) 188 lb 0.8 oz 187 lb 11.2 oz 180 lb 9.6 oz  Weight (kg) 85.3 kg 85.14 kg 81.92 kg     Body mass index is 27.77 kg/m.  General:  Well nourished, well developed, in no acute distress at rest HEENT: normal Neck: no JVD Vascular: No carotid bruits; Distal pulses 2+ bilaterally Cardiac:  normal S1, S2; slow but regular; 3/6 murmur  Lungs: Generally poor inspiratory effort, few rales bases bilaterally, no wheezing, rhonchi   Abd: soft, nontender, no hepatomegaly  Ext: no edema Musculoskeletal:  No deformities, BUE and BLE strength extremely weak but equal Skin: warm and dry  Neuro:  CNs 2-12 intact, no focal abnormalities noted Psych:  Normal affect   EKG:  The EKG was personally reviewed and demonstrates: Sinus rhythm, heart rate 70, no acute ischemic changes, PR interval 198 ms, LAFB Telemetry:  Telemetry was personally reviewed and demonstrates: Sinus bradycardia with borderline first-degree AV block, occasional PACs and PVCs  Relevant CV Studies:  ZIO Monitor: 10/7 - 09/10/2021 Patient had a min HR of 39 bpm, max HR of 158 bpm, and avg HR of 54 bpm. Predominant underlying rhythm was Sinus Rhythm. Bundle Branch Block/IVCD was present. 1 run of Ventricular Tachycardia occurred lasting 4 beats with a max rate of 158 bpm (avg 114  bpm). 18 Supraventricular Tachycardia runs occurred, the run with the fastest interval lasting 7 beats with a max rate of 144 bpm, the longest lasting 7 beats with an avg rate of 114 bpm. Idioventricular Rhythm was present. Isolated SVEs were rare  (<1.0%), SVE Couplets were rare (<1.0%), and SVE Triplets were rare (<1.0%). Isolated VEs were rare (<1.0%,  3611), VE Couplets were rare (<1.0%, 75), and VE Triplets were rare (<1.0%, 4). Ventricular Bigeminy was present.   Summary and conclusions: 1 episode of ventricular tachycardia only 4 beats. 18 episode of  supraventricular tachycardia no atrial fibrillation recorded  MYOVIEW: 09/20/2020 Defect 1: There is a small defect of mild severity present in the apical inferior location. This is a low risk study. NO evidence of ischemia. Gated imaging not done. Fixed small mild defect involving apical porton of the inferior wall which can represent old MI or artefact.  ECHO: 02/29/2020 at [facility] EF 11-94%, normal diastolic function, mild aortic valve sclerosis, trace AR, normal RVSP, left atrium and right atrium normal in size  Laboratory Data:  High Sensitivity Troponin:  No results for input(s): TROPONINIHS in the last 720 hours.   Chemistry Recent Labs  Lab 10/06/21 1216 10/07/21 0446 10/08/21 0436  NA 129* 130* 132*  K 4.0 4.0 4.1  CL 94* 101 103  CO2 25 22 24   GLUCOSE 169* 312* 254*  BUN 27* 26* 25*  CREATININE 2.07* 1.92* 1.94*  CALCIUM 8.6* 7.9* 7.9*  MG  --  1.9 2.0  GFRNONAA 33* 37* 36*  ANIONGAP 10 7 5     Recent Labs  Lab 10/06/21 1216 10/07/21 0446  PROT 7.8 6.2*  ALBUMIN 3.5 2.7*  AST 30 20  ALT 25 18  ALKPHOS 84 64  BILITOT 0.6 0.9   Lipids No results for input(s): CHOL, TRIG, HDL, LABVLDL, LDLCALC, CHOLHDL in the last 168 hours.  Hematology Recent Labs  Lab 10/06/21 1653 10/07/21 0446 10/08/21 0436  WBC 4.6 3.6* 3.6*  RBC 2.84* 2.71*  2.73* 2.54*  HGB 8.4* 8.1* 7.7*  HCT 25.6* 24.3* 23.5*  MCV 90.1 89.7 92.5  MCH 29.6 29.9 30.3  MCHC 32.8 33.3 32.8  RDW 13.1 13.0 13.1  PLT 186 196 191   Thyroid  Recent Labs  Lab 10/07/21 0446 10/07/21 1104  TSH 5.005*  --   FREET4  --  1.13*    BNP Recent Labs  Lab 10/06/21 1216  BNP 88.7    DDimer No results for input(s): DDIMER in the last 168 hours.   Radiology/Studies:  CT Head Wo Contrast  Result Date: 10/06/2021 CLINICAL DATA:  75 year old male with weakness and multiple falls. EXAM: CT HEAD WITHOUT CONTRAST TECHNIQUE: Contiguous axial images were obtained from the base of the skull  through the vertex without intravenous contrast. COMPARISON:  Brain MRI 06/25/2006.  Head CT 04/26/2021. FINDINGS: Brain: Stable cerebral volume since June. No midline shift, ventriculomegaly, mass effect, evidence of mass lesion, intracranial hemorrhage or evidence of cortically based acute infarction. Gray-white matter differentiation is stable and largely normal for age. There is minimal to mild periventricular white matter hypodensity. Vascular: Calcified atherosclerosis at the skull base. No suspicious intracranial vascular hyperdensity. Skull: Stable, intact. Sinuses/Orbits: Visualized paranasal sinuses and mastoids are stable and well aerated. Other: Visualized orbits and scalp soft tissues are within normal limits. IMPRESSION: No acute intracranial abnormality. Stable mild for age cerebral white matter changes. Electronically Signed   By: Genevie Ann M.D.   On: 10/06/2021 11:37   DG Chest Port 1 View  Result Date: 10/06/2021 CLINICAL DATA:  75 year old male with possible sepsis. EXAM: PORTABLE CHEST 1 VIEW COMPARISON:  CT Chest, Abdomen, and Pelvis 11/20/2020 and earlier. FINDINGS: Portable AP upright view at 1056 hours. Mildly improved lung volumes. Mediastinal contours are stable. Borderline to mild cardiomegaly. Allowing for portable technique the lungs are clear. Visualized tracheal air column  is within normal limits. No pneumothorax or pleural effusion. Negative visible bowel gas. No acute osseous abnormality identified. IMPRESSION: No acute cardiopulmonary abnormality. Electronically Signed   By: Genevie Ann M.D.   On: 10/06/2021 11:38     Assessment and Plan:   Bradycardia -He does not appear to have chronotropic incompetence as his ECG had a heart rate of 70 - It is difficult to tell if the bradycardia is causing his weakness since his hemoglobin has dropped to 7.7 - His creatinine was also above normal leading to concerns for dehydration -His creatinine is higher than normal for him, he has  been hydrated with over 1.5 L of fluids. - Higher doses of amiodarone had previously caused bradycardia.  He had been tolerating amiodarone 100 mg daily. - Amiodarone was discontinued on admission. - He was not on a beta-blocker - He was on digoxin 0.125 mg QOD, this was held on admission - Amlodipine 5 mg was also held although this is unlikely to have any significant effect on his heart rate - His TSH was mildly elevated, and his free T4 was also minimally elevated. - He is on his home dose of Synthroid 75 mcg daily -His hemoglobin has been dropping since admission, possibly he was hemoconcentrated. - His H&H were 9.5/30.7 in June 2022. -Discuss with MD if we need to consider a pacemaker   Risk Assessment/Risk Scores:     For questions or updates, please contact [place] Please consult [email or web address] for contact info under  Signed, Rosaria Ferries, PA-C  10/08/2021 7:59 AM As above, patient seen and examined.  Briefly he is a 75 year old male with past medical history of paroxysmal atrial fibrillation, history of bradycardia, diabetes mellitus, hypertension, hyperlipidemia, chronic stage III-IV kidney disease for evaluation of bradycardia.  Last echocardiogram April 2021 showed normal LV function.  Nuclear study at that time showed no ischemia.  Monitor October 2022 showed sinus rhythm with minimum heart rate 39 and maximum 158, 4 beats of nonsustained ventricular tachycardia, brief PAT but no prolonged pauses.  Patient has had progressive weakness over the past 1 year following episode of cholecystitis.  He now has difficulty ambulating and has fallen multiple times but denies presyncope or syncope.  He occasionally has pain in his chest and back in various locations.  No palpitations.  He has some dyspnea on exertion but no orthopnea, PND.  He was admitted with generalized weakness and heart rate in the 40s.  Cardiology now asked to evaluate.  Laboratories on admission showed sodium  126, creatinine 1.96, hemoglobin 10.1.  Chest x-ray without acute infiltrate.  TSH 5.005.  Electrocardiogram shows normal sinus rhythm with left anterior fascicular block.  Review of telemetry shows sinus to sinus bradycardia with occasional junctional escape beat and PVC.  No prolonged pauses.  1 bradycardia-patient is noted to have sinus bradycardia with occasional junctional escape beat on telemetry.  However there are no prolonged pauses and he has no history of syncope.  It is not clear that bradycardia is contributing to his weakness as he is weak continuously.  We will discontinue digoxin and amiodarone and follow on telemetry.  No indication for pacemaker at this point.  We will repeat echocardiogram.  2 generalized weakness-etiology unclear.  Further management per primary care.  3 hypertension-follow blood pressure and adjust medications as needed.  4 paroxysmal atrial fibrillation-as outlined above given bradycardia will discontinue digoxin and amiodarone.  Can continue Xarelto for now.  However if he falls frequently will need to consider  discontinuing due to risk outweighing benefit.  Kirk Ruths, MD

## 2021-10-08 NOTE — Progress Notes (Signed)
PROGRESS NOTE  Jeremiah Beasley. ZOX:[id number] DOB: [birth year] DOA: 10/06/2021 PCP: [name], MD  HPI/Recap of past 24 hours: Jeremiah Beasley. is a 75 y.o. male with medical history significant for paroxysmal atrial fibrillation on Xarelto, transient bradycardia followed by cardiology, type 2 diabetes on insulin pump, diabetic polyneuropathy, GERD, essential hypertension, hyperlipidemia, hypothyroidism, intolerance to ACE inhibitor due to tongue swelling, who presented to [facility] ED, brought in by his wife and son due to generalized weakness and multiple falls, 5 times this week, including 1 fall prior to presenting to [facility] ED.  Associated with poor oral intake, a wound on his right buttock present for 2 months, intermittent fevers at home with T-max 101.2 a few days prior to presentation.  Per his son at bedside his weakness has rapidly progressed for the past 2 weeks to the point where he shakes when he stands up due to his generalized weakness.  Has had unintentional weight loss, 24 pounds in the last 3 months due to lack of appetite and poor oral intake.     Per his wife at bedside his decline started almost a year ago after his gallbladder surgery.  He had a prolonged hospital stay and since then he has progressively declined.  Went from ambulating with a cane to needing a walker to ambulate short distances.     Upon presentation to the ED, patient appears weak and severely debilitated.  Per his son at bedside, he had an episode of staring off and gurgling while in the waiting room in the ED, unclear if the patient had a syncopal episode.  There were no witnessed seizure-like activity.     Work-up in the ED revealed hypovolemic hyponatremia and AKI, likely prerenal.  EDP requested admission for further evaluation and management of present condition.  Patient was admitted as a direct admit from [facility] ED to [facility] progressive care unit by Dr. Olevia Bowens, from [facility], Hospitalist  service.  10/08/2021: Patient was seen at bedside.  His wife was present in the room.  There were no acute events overnight.  Cardiology in the room, seing patient for evaluation of bradycardia.  Assessment/Plan: Principal Problem:   Hyponatremia Active Problems:   Generalized weakness  Generalized weakness and recurrent falls, unclear etiology Reported 5 falls this week, 1 at home, prior to presenting to [facility] ED Obtain orthostatic vital signs. TSH 5.005, follow-up free T4.   Completed IV fluid hydration normal saline at 50 cc/h x 1 day. Presented with a right buttock wound/cellulitis, present for 2 months. Continue to treat empirically with linezolid and Rocephin, history of MRSA- Continue Florastor 250 mg twice daily. Continue to follow urine culture, blood cultures x2 peripherally. Closely monitor fever curve and WBC Replete electrolytes as indicated Treat underlying conditions Evaluated by PT OT with recommendation for home health PT OT. TOC consulted to assist with home health PT OT arrangement. Continue fall precautions  Transient bradycardia Hold off Norvasc, digoxin and amiodarone for now, avoid AV nodal blockade agents until seen by cardiology.   Management per cardiology.  Anemia of chronic disease/chronic normocytic anemia in the setting of CKD 3B/chronic wound/iron deficiency anemia. Hemoglobin 10.2K, downtrending 8.1K Monitor H&H, repeat CBC in the morning, if hemoglobin low will consult GI. Follow iron studies.   Improving, hypovolemic hyponatremia Presented with serum sodium of 126, repeat 129. Serum sodium 130 on 10/07/2021, 132 on 10/08/2021. Hypovolemic on presentation He completed 2 days of IV fluid hydration normal saline at 50 cc/h.  Right buttock cellulitis/wound, POA Wound care specialist consulted Continue local wound care. Continue broad-spectrum IV antibiotics Follow MRSA screen   Improving AKI on CKD 3B likely prerenal in the setting of  dehydration and poor oral intake Baseline creatinine appears to be 1.51 with GFR of 52 Creatinine downtrending 1.92 from 2.07 Continue to avoid nephrotoxic agents, dehydration and hypotension Continue to closely monitor urine output with strict I's and O's Continue IV fluid hydration x1 day. Repeat BMP in the morning.   Type 2 diabetes on insulin pump with hyperglycemia Hemoglobin A1c 6.0 on 10/07/2021 Diabetes coordinator following Continue to hold off insulin pump for now Continue insulin sliding scale. Semglee 10 unit daily, added another 10 unit. Continue NovoLog 4 units 3 times daily   Diabetic polyneuropathy Continue home gabapentin   Paroxysmal A. fib on Xarelto Prior to admission on digoxin and amiodarone, hold off due to bradycardia. Continue to closely monitor on telemetry.   Essential hypertension BP stable. Continue home hydralazine at lower dose 10 mg 3 times daily Continue to closely monitor vital signs   Hyperlipidemia Follow CPK results prior to restarting home Zocor   Hypothyroidism TSH 5, follow-up free T4. Continue home levothyroxine   GERD Continue home PPI   Resolved elevated lactic acid On presentation lactic acid 2.7 Received 1 L IV fluid bolus in the ED Repeat lactic acid post IV fluid hydration normalized 0.9.       DVT prophylaxis: Xarelto   Code Status: Full code   Family Communication: Updated patient's wife via phone.   Disposition Plan: Admitted to progressive unit   Consults called: Cardiology   Admission status: Inpatient status.     Status is: Inpatient   Inpatient status.  Patient will require at least 2 midnights for further evaluation and treatment of present condition.       Objective: Vitals:   10/07/21 1023 10/07/21 1303 10/08/21 0500 10/08/21 1158  BP:  (!) 143/57  (!) 130/54  Pulse:  (!) 55  (!) 43  Resp: 18 14  18   Temp:  97.7 F (36.5 C)  98.3 F (36.8 C)  TempSrc:  Oral  Oral  SpO2:  90%  96%   Weight:   85.3 kg   Height:        Intake/Output Summary (Last 24 hours) at 10/08/2021 1432 Last data filed at 10/08/2021 1300 Gross per 24 hour  Intake 1674.29 ml  Output 850 ml  Net 824.29 ml   Filed Weights   10/06/21 1610 10/07/21 0329 10/08/21 0500  Weight: 81.9 kg 85.1 kg 85.3 kg    Exam:  General: 75 y.o. year-old male Well developed well nourished in no acute distress.  Alert and oriented x 3 Cardiovascular: Regular rate and rhythm no rubs and gallops Respiratory: Clear to auscultation with no wheezes or rales.  Good inspiratory efforts.  Abdomen: Soft non tender non distended with bowel sounds present.  Musculoskeletal: No lower extremity edema Skin: No ulcerative lesions Psychiatry: Mood is appropriate for condition and setting.  Data Reviewed: CBC: Recent Labs  Lab 10/06/21 0920 10/06/21 1216 10/06/21 1653 10/07/21 0446 10/08/21 0436  WBC 6.7 9.1 4.6 3.6* 3.6*  NEUTROABS  --  7.3 3.7  --  2.3  HGB 10.1* 10.2* 8.4* 8.1* 7.7*  HCT 29.9* 31.2* 25.6* 24.3* 23.5*  MCV 89.0 90.2 90.1 89.7 92.5  PLT 255 177 186 196 935   Basic Metabolic Panel: Recent Labs  Lab 10/06/21 0920 10/06/21 1216 10/07/21 0446 10/08/21 0436  NA 126*  129* 130* 132*  K 3.6 4.0 4.0 4.1  CL 94* 94* 101 103  CO2 23 25 22 24   GLUCOSE 154* 169* 312* 254*  BUN 28* 27* 26* 25*  CREATININE 1.96* 2.07* 1.92* 1.94*  CALCIUM 8.4* 8.6* 7.9* 7.9*  MG  --   --  1.9 2.0  PHOS  --   --  3.1 3.5   GFR: Estimated Creatinine Clearance: 37.2 mL/min (A) (by C-G formula based on SCr of 1.94 mg/dL (H)). Liver Function Tests: Recent Labs  Lab 10/06/21 1216 10/07/21 0446  AST 30 20  ALT 25 18  ALKPHOS 84 64  BILITOT 0.6 0.9  PROT 7.8 6.2*  ALBUMIN 3.5 2.7*   No results for input(s): LIPASE, AMYLASE in the last 168 hours. Recent Labs  Lab 10/06/21 1216  AMMONIA 14   Coagulation Profile: Recent Labs  Lab 10/06/21 1216  INR 2.2*   Cardiac Enzymes: Recent Labs  Lab 10/07/21 0446  10/08/21 0436  CKTOTAL 32* 22*   BNP (last 3 results) No results for input(s): PROBNP in the last 8760 hours. HbA1C: Recent Labs    10/07/21 0446  HGBA1C 6.0*   CBG: Recent Labs  Lab 10/07/21 1123 10/07/21 1627 10/07/21 1936 10/08/21 0736 10/08/21 1156  GLUCAP 274* 230* 220* 270* 233*   Lipid Profile: No results for input(s): CHOL, HDL, LDLCALC, TRIG, CHOLHDL, LDLDIRECT in the last 72 hours. Thyroid Function Tests: Recent Labs    10/07/21 0446 10/07/21 1104  TSH 5.005*  --   FREET4  --  1.13*   Anemia Panel: Recent Labs    10/07/21 0441 10/07/21 0446  FERRITIN 73  --   TIBC 178*  --   IRON 29*  --   RETICCTPCT  --  1.9   Urine analysis:    Component Value Date/Time   COLORURINE YELLOW 10/06/2021 [place] 10/06/2021 1216   LABSPEC 1.010 10/06/2021 1216   PHURINE 5.5 10/06/2021 1216   GLUCOSEU NEGATIVE 10/06/2021 1216   HGBUR TRACE (A) 10/06/2021 1216   [place] 10/06/2021 [place] 10/06/2021 1216   PROTEINUR NEGATIVE 10/06/2021 1216   NITRITE NEGATIVE 10/06/2021 1216   LEUKOCYTESUR NEGATIVE 10/06/2021 1216   Sepsis Labs: @LABRCNTIP (procalcitonin:4,lacticidven:4)  ) Recent Results (from the past 240 hour(s))  Blood culture (routine x 2)     Status: None (Preliminary result)   Collection Time: 10/06/21  9:50 AM   Specimen: BLOOD RIGHT ARM  Result Value Ref Range Status   Specimen Description   Final    BLOOD RIGHT ARM BLOOD Performed at [facility], [address] [place], [place] [address]    Special Requests   Final    Blood Culture adequate volume BOTTLES DRAWN AEROBIC AND ANAEROBIC Performed at [facility], [address] [place], [place] [address]    Culture   Final    NO GROWTH 1 DAY Performed at [facility], [place] [address], [place],  [address]    Report Status PENDING  Incomplete  Resp Panel by RT-PCR (Flu A&B, Covid) Nasopharyngeal Swab     Status:  None   Collection Time: 10/06/21 10:14 AM   Specimen: Nasopharyngeal Swab; Nasopharyngeal(NP) swabs in vial transport medium  Result Value Ref Range Status   SARS Coronavirus 2 by RT PCR NEGATIVE NEGATIVE Final    Comment: (NOTE) SARS-CoV-2 target nucleic acids are NOT DETECTED.  The SARS-CoV-2 RNA is generally detectable in upper respiratory specimens during  the acute phase of infection. The lowest concentration of SARS-CoV-2 viral copies this assay can detect is 138 copies/mL. A negative result does not preclude SARS-Cov-2 infection and should not be used as the sole basis for treatment or other patient management decisions. A negative result may occur with  improper specimen collection/handling, submission of specimen other than nasopharyngeal swab, presence of viral mutation(s) within the areas targeted by this assay, and inadequate number of viral copies(<138 copies/mL). A negative result must be combined with clinical observations, patient history, and epidemiological information. The expected result is Negative.  Fact Sheet for Patients:  EntrepreneurPulse.com.au  Fact Sheet for Healthcare Providers:  IncredibleEmployment.be  This test is no t yet approved or cleared by the [place] FDA and  has been authorized for detection and/or diagnosis of SARS-CoV-2 by FDA under an Emergency Use Authorization (EUA). This EUA will remain  in effect (meaning this test can be used) for the duration of the COVID-19 declaration under Section 564(b)(1) of the Act, 21 U.S.C.section 360bbb-3(b)(1), unless the authorization is terminated  or revoked sooner.       Influenza A by PCR NEGATIVE NEGATIVE Final   Influenza B by PCR NEGATIVE NEGATIVE Final    Comment: (NOTE) The Xpert Xpress SARS-CoV-2/FLU/RSV plus assay is intended as an aid in the diagnosis of influenza from Nasopharyngeal swab specimens and should not be used as a sole basis for  treatment. Nasal washings and aspirates are unacceptable for Xpert Xpress SARS-CoV-2/FLU/RSV testing.  Fact Sheet for Patients: EntrepreneurPulse.com.au  Fact Sheet for Healthcare Providers: IncredibleEmployment.be  This test is not yet approved or cleared by the [place] FDA and has been authorized for detection and/or diagnosis of SARS-CoV-2 by FDA under an Emergency Use Authorization (EUA). This EUA will remain in effect (meaning this test can be used) for the duration of the COVID-19 declaration under Section 564(b)(1) of the Act, 21 U.S.C. section 360bbb-3(b)(1), unless the authorization is terminated or revoked.  Performed at [facility], [address], [place], [place] [address]   Urine Culture     Status: Abnormal   Collection Time: 10/06/21 12:16 PM   Specimen: In/Out Cath Urine  Result Value Ref Range Status   Specimen Description   Final    IN/OUT CATH URINE Performed at [facility], [address] [place], [place] [address]    Special Requests   Final    NONE Performed at [facility], [address] [place], [place] [address]    Culture 1,000 COLONIES/mL ENTEROCOCCUS FAECALIS (A)  Final   Report Status 10/08/2021 FINAL  Final   Organism ID, Bacteria ENTEROCOCCUS FAECALIS (A)  Final      Susceptibility   Enterococcus faecalis - MIC*    AMPICILLIN <=2 SENSITIVE Sensitive     NITROFURANTOIN <=16 SENSITIVE Sensitive     VANCOMYCIN 1 SENSITIVE Sensitive     * 1,000 COLONIES/mL ENTEROCOCCUS FAECALIS  Blood Culture (routine x 2)     Status: None (Preliminary result)   Collection Time: 10/06/21 12:30 PM   Specimen: Left Antecubital; Blood  Result Value Ref Range Status   Specimen Description   Final    LEFT ANTECUBITAL Performed at [facility], [address] [place], [place] [address]    Special Requests   Final    BOTTLES DRAWN AEROBIC AND ANAEROBIC Blood Culture  adequate volume Performed at [facility], [address] [place], [place] [address]  Culture   Final    NO GROWTH 1 DAY Performed at [facility], [place] [address], [place], [place] [address]    Report Status PENDING  Incomplete      Studies: No results found.  Scheduled Meds:  gabapentin  300 mg Oral BID   hydrALAZINE  10 mg Oral Q8H   insulin aspart  0-5 Units Subcutaneous QHS   insulin aspart  0-9 Units Subcutaneous TID WC   insulin aspart  4 Units Subcutaneous TID WC   insulin glargine-yfgn  10 Units Subcutaneous Daily   levothyroxine  75 mcg Oral Q0600   pantoprazole  40 mg Oral Daily   rivaroxaban  15 mg Oral q1800   saccharomyces boulardii  250 mg Oral BID    Continuous Infusions:  cefTRIAXone (ROCEPHIN)  IV 2 g (10/08/21 0936)   linezolid (ZYVOX) IV 600 mg (10/08/21 1233)     LOS: 2 days     Kayleen Memos, MD [facility] Pager [phone]  If 7PM-7AM, please contact night-coverage [email or web address] Password [email or web address] 10/08/2021, 2:32 PM

## 2021-10-09 ENCOUNTER — Inpatient Hospital Stay (HOSPITAL_COMMUNITY): Payer: Medicare Other

## 2021-10-09 ENCOUNTER — Other Ambulatory Visit (HOSPITAL_COMMUNITY): Payer: Medicare Other

## 2021-10-09 LAB — CBC
HCT: 29 % — ABNORMAL LOW (ref 39.0–52.0)
Hemoglobin: 9.5 g/dL — ABNORMAL LOW (ref 13.0–17.0)
MCH: 30.4 pg (ref 26.0–34.0)
MCHC: 32.8 g/dL (ref 30.0–36.0)
MCV: 92.7 fL (ref 80.0–100.0)
Platelets: 247 10*3/uL (ref 150–400)
RBC: 3.13 MIL/uL — ABNORMAL LOW (ref 4.22–5.81)
RDW: 13.3 % (ref 11.5–15.5)
WBC: 4.5 10*3/uL (ref 4.0–10.5)
nRBC: 0 % (ref 0.0–0.2)

## 2021-10-09 LAB — PREPARE RBC (CROSSMATCH)

## 2021-10-09 LAB — GLUCOSE, CAPILLARY
Glucose-Capillary: 142 mg/dL — ABNORMAL HIGH (ref 70–99)
Glucose-Capillary: 283 mg/dL — ABNORMAL HIGH (ref 70–99)
Glucose-Capillary: 127 mg/dL — ABNORMAL HIGH (ref 70–99)
Glucose-Capillary: 42 mg/dL — CL (ref 70–99)
Glucose-Capillary: 56 mg/dL — ABNORMAL LOW (ref 70–99)
Glucose-Capillary: 121 mg/dL — ABNORMAL HIGH (ref 70–99)

## 2021-10-09 LAB — BASIC METABOLIC PANEL
Anion gap: 8 (ref 5–15)
BUN: 22 mg/dL (ref 8–23)
CO2: 24 mmol/L (ref 22–32)
Calcium: 8.5 mg/dL — ABNORMAL LOW (ref 8.9–10.3)
Chloride: 103 mmol/L (ref 98–111)
Creatinine, Ser: 1.87 mg/dL — ABNORMAL HIGH (ref 0.61–1.24)
GFR, Estimated: 38 mL/min — ABNORMAL LOW (ref >60)
Glucose, Bld: 211 mg/dL — ABNORMAL HIGH (ref 70–99)
Potassium: 3.9 mmol/L (ref 3.5–5.1)
Sodium: 135 mmol/L (ref 135–145)

## 2021-10-09 LAB — HEMOGLOBIN AND HEMATOCRIT, BLOOD
HCT: 27.8 % — ABNORMAL LOW (ref 39.0–52.0)
Hemoglobin: 9.2 g/dL — ABNORMAL LOW (ref 13.0–17.0)

## 2021-10-09 LAB — ABO/RH: ABO/RH(D): A NEG

## 2021-10-09 MED ORDER — SODIUM CHLORIDE 0.9% IV SOLUTION: Freq: Once | INTRAVENOUS | Status: AC

## 2021-10-09 NOTE — Consult Note (Signed)
[facility] Surgery Consult Note  Quan Cybulski. 23-Feb-1946  [id number].    Requesting MD: Irene Pap Chief Complaint/Reason for Consult: buttock wound  HPI:  Jeremiah Beasley. is a 75yo male PMH HTN, DM, HLD, hypothyroidism, transient bradycardia, PAF on xarelto (last dose 11/22 in evening) who was admitted to [facility] 10/06/21 with generalized weakness and recurrent falls. He was found to have iron deficiency anemia and is scheduled for EGD with GI tomorrow, he has declined colonoscopy. Upon admission patient also reported a 2 month history of right buttock wound. States that it drains at times. He has never had anything like this in the past. He was started on vancomycin and rocephin with improvement of erythema, but wound is still present. General surgery asked to see.   Review of Systems  Skin:        Right buttock pain   All systems reviewed and otherwise negative except for as above  Family History  Problem Relation Age of Onset   Heart attack Father    Hyperlipidemia Father    Stroke Father     Past Medical History:  Diagnosis Date   Atrial fibrillation (HCC)    Chronic pain of right knee 04/11/2020   Diabetes mellitus without complication ([place])    Essential hypertension 05/15/2015   Hyperlipidemia    Hypertension    MRSA (methicillin resistant staph aureus) culture positive    Paroxysmal atrial fibrillation ([place]) 05/15/2015   Pneumonia 10/20/2020   Pure hypercholesterolemia 05/15/2015   S/P right knee arthroscopy 04/25/2020   Type 2 diabetes mellitus without complication ([place]) 42/35/3614    Past Surgical History:  Procedure Laterality Date   gall bladder removed   10/20/2020   Iron infusion  05/18/2021   x2   NO PAST SURGERIES      Social History:  reports that he has never smoked. He uses smokeless tobacco. He reports that he does not drink alcohol and does not use drugs.  Allergies:  Allergies  Allergen Reactions   Quinapril Shortness Of Breath,  Swelling and Other (See Comments)    TONGUE SWELLLING   Methaqualone Other (See Comments)    Unknown to patient   Tape Rash and Other (See Comments)    Skin is sensitive!!    Medications Prior to Admission  Medication Sig Dispense Refill   amiodarone (PACERONE) 200 MG tablet Take 100 mg by mouth daily.     amLODipine (NORVASC) 5 MG tablet Take 5 mg by mouth at bedtime.     digoxin (LANOXIN) 0.125 MG tablet Take 0.125 mg by mouth every other day.     diphenhydrAMINE (BENADRYL) 25 mg capsule Take 25 mg by mouth at bedtime.     ezetimibe (ZETIA) 10 MG tablet Take 1 tablet (10 mg total) by mouth daily. 90 tablet 3   furosemide (LASIX) 20 MG tablet TAKE ONE (1) TABLET BY MOUTH EVERY DAY. (Patient taking differently: Take 20 mg by mouth in the morning.) 90 tablet 2   gabapentin (NEURONTIN) 300 MG capsule Take 1 capsule (300 mg total) by mouth 2 (two) times daily. 180 capsule 3   hydrALAZINE (APRESOLINE) 100 MG tablet Take 100 mg by mouth 3 (three) times daily.     Insulin Human (INSULIN PUMP) SOLN Inject 1 each into the skin continuous.     levothyroxine (SYNTHROID, LEVOTHROID) 75 MCG tablet Take 75 mcg by mouth daily before breakfast.     Multiple Vitamins-Minerals (CENTRUM SILVER ADULT 50+ PO) Take 1 tablet by mouth daily with  breakfast.     omeprazole (PRILOSEC) 40 MG capsule Take 40 mg by mouth daily before supper.     rivaroxaban (XARELTO) 20 MG TABS tablet Take 20 mg by mouth daily with supper.     simvastatin (ZOCOR) 80 MG tablet Take 80 mg by mouth daily.     amLODipine (NORVASC) 10 MG tablet Take 1 tablet (10 mg total) by mouth daily. (Patient not taking: Reported on 10/06/2021) 90 tablet 2    Prior to Admission medications   Medication Sig Start Date End Date Taking? Authorizing Provider  amiodarone (PACERONE) 200 MG tablet Take 100 mg by mouth daily. 11/12/16  Yes [provider]  amLODipine (NORVASC) 5 MG tablet Take 5 mg by mouth at bedtime.   Yes [provider]  digoxin (LANOXIN) 0.125 MG tablet Take 0.125 mg by mouth every other day.   Yes [provider]  diphenhydrAMINE (BENADRYL) 25 mg capsule Take 25 mg by mouth at bedtime.   Yes [provider]  ezetimibe (ZETIA) 10 MG tablet Take 1 tablet (10 mg total) by mouth daily. 06/08/20 11/05/21 Yes Park Liter, MD  furosemide (LASIX) 20 MG tablet TAKE ONE (1) TABLET BY MOUTH EVERY DAY. Patient taking differently: Take 20 mg by mouth in the morning. 04/02/21  Yes Park Liter, MD  gabapentin (NEURONTIN) 300 MG capsule Take 1 capsule (300 mg total) by mouth 2 (two) times daily. 09/24/21  Yes Patel, Donika K, DO  hydrALAZINE (APRESOLINE) 100 MG tablet Take 100 mg by mouth 3 (three) times daily. 01/25/21  Yes [provider]  Insulin Human (INSULIN PUMP) SOLN Inject 1 each into the skin continuous.   Yes [provider]  levothyroxine (SYNTHROID, LEVOTHROID) 75 MCG tablet Take 75 mcg by mouth daily before breakfast. 05/22/16  Yes [provider]  Multiple Vitamins-Minerals (CENTRUM SILVER ADULT 50+ PO) Take 1 tablet by mouth daily with breakfast.   Yes [provider]  omeprazole (PRILOSEC) 40 MG capsule Take 40 mg by mouth daily before supper. 02/16/21  Yes [provider]  rivaroxaban (XARELTO) 20 MG TABS tablet Take 20 mg by mouth daily with supper. 10/08/16  Yes [provider]  simvastatin (ZOCOR) 80 MG tablet Take 80 mg by mouth daily. 03/12/19  Yes [provider]  amLODipine (NORVASC) 10 MG tablet Take 1 tablet (10 mg total) by mouth daily. Patient not taking: Reported on 10/06/2021 12/21/20 10/07/21  Park Liter, MD    Blood pressure 128/76, pulse (!) 40, temperature 98 F (36.7 C), temperature source Oral, resp. rate 18, height 5\' 9"  (1.753 m), weight 85.4 kg, SpO2 98 %. Physical Exam: General: pleasant, WD/WN male who is sitting up in chair NAD HEENT: head is normocephalic, atraumatic.  Sclera are  noninjected.  Pupils equal and round.  Ears and nose without any masses or lesions.  Mouth is pink and moist. Dentition fair Heart: bradycardia  Lungs: no audible wheezes. Respiratory effort nonlabored Abd: soft, NT/ND, no masses, hernias, or organomegaly MS: no BUE/BLE edema Skin: warm and dry  Psych: A&Ox3 with an appropriate affect Neuro: cranial nerves grossly intact, moving all 4 extremities, normal speech, thought process intact GU: right buttock with 3cm round area of induration and mild erythema and skin breakdown, no purulent drainage   Results for orders placed or performed during the hospital encounter of 10/06/21 (from the past 48 hour(s))  Glucose, capillary     Status: Abnormal   Collection Time: 10/07/21  4:27 PM  Result Value Ref Range   Glucose-Capillary 230 (H) 70 - 99 mg/dL    Comment: Glucose reference range applies only to samples taken after fasting for at least 8 hours.  Glucose, capillary     Status: Abnormal   Collection Time: 10/07/21  7:36 PM  Result Value Ref Range   Glucose-Capillary 220 (H) 70 - 99 mg/dL    Comment: Glucose reference range applies only to samples taken after fasting for at least 8 hours.  Basic metabolic panel     Status: Abnormal   Collection Time: 10/08/21  4:36 AM  Result Value Ref Range   Sodium 132 (L) 135 - 145 mmol/L   Potassium 4.1 3.5 - 5.1 mmol/L   Chloride 103 98 - 111 mmol/L   CO2 24 22 - 32 mmol/L   Glucose, Bld 254 (H) 70 - 99 mg/dL    Comment: Glucose reference range applies only to samples taken after fasting for at least 8 hours.   BUN 25 (H) 8 - 23 mg/dL   Creatinine, Ser 1.94 (H) 0.61 - 1.24 mg/dL   Calcium 7.9 (L) 8.9 - 10.3 mg/dL   GFR, Estimated 36 (L) >60 mL/min    Comment: (NOTE) Calculated using the CKD-EPI Creatinine Equation (2021)    Anion gap 5 5 - 15    Comment: Performed at [facility], [place] [address], [place], [place] [address]  CBC with Differential/Platelet     Status: Abnormal    Collection Time: 10/08/21  4:36 AM  Result Value Ref Range   WBC 3.6 (L) 4.0 - 10.5 K/uL   RBC 2.54 (L) 4.22 - 5.81 MIL/uL   Hemoglobin 7.7 (L) 13.0 - 17.0 g/dL   HCT 23.5 (L) 39.0 - 52.0 %   MCV 92.5 80.0 - 100.0 fL   MCH 30.3 26.0 - 34.0 pg   MCHC 32.8 30.0 - 36.0 g/dL   RDW 13.1 11.5 - 15.5 %   Platelets 191 150 - 400 K/uL   nRBC 0.0 0.0 - 0.2 %   Neutrophils Relative % 63 %   Neutro Abs 2.3 1.7 - 7.7 K/uL   Lymphocytes Relative 21 %   Lymphs Abs 0.7 0.7 - 4.0 K/uL   Monocytes Relative 10 %   Monocytes Absolute 0.4 0.1 - 1.0 K/uL   Eosinophils Relative 4 %   Eosinophils Absolute 0.1 0.0 - 0.5 K/uL   Basophils Relative 1 %   Basophils Absolute 0.0 0.0 - 0.1 K/uL   Immature Granulocytes 1 %   Abs Immature Granulocytes 0.02 0.00 - 0.07 K/uL    Comment: Performed at [facility], [place] [address] [place], [place] [address]  Magnesium     Status: None   Collection Time: 10/08/21  4:36 AM  Result Value Ref Range   Magnesium 2.0 1.7 - 2.4 mg/dL    Comment: Performed at [facility], [place] [address], [place], [place] [address]  Phosphorus     Status: None   Collection Time: 10/08/21  4:36 AM  Result Value Ref Range   Phosphorus 3.5 2.5 - 4.6 mg/dL    Comment: Performed at [facility], [place] [address], [place], [place] [address]  CK     Status: Abnormal   Collection Time: 10/08/21  4:36 AM  Result Value Ref Range   Total CK 22 (L) 49 - 397 U/L    Comment: Performed at [facility], [place] [address], [place],  [address]  Glucose,  capillary     Status: Abnormal   Collection Time: 10/08/21  7:36 AM  Result Value Ref Range   Glucose-Capillary 270 (H) 70 - 99 mg/dL    Comment: Glucose reference range applies only to samples taken after fasting for at least 8 hours.  Glucose, capillary     Status: Abnormal   Collection Time: 10/08/21 11:56 AM  Result Value Ref Range   Glucose-Capillary 233 (H) 70 -  99 mg/dL    Comment: Glucose reference range applies only to samples taken after fasting for at least 8 hours.  Glucose, capillary     Status: Abnormal   Collection Time: 10/08/21  4:26 PM  Result Value Ref Range   Glucose-Capillary 181 (H) 70 - 99 mg/dL    Comment: Glucose reference range applies only to samples taken after fasting for at least 8 hours.  Glucose, capillary     Status: Abnormal   Collection Time: 10/08/21  7:41 PM  Result Value Ref Range   Glucose-Capillary 136 (H) 70 - 99 mg/dL    Comment: Glucose reference range applies only to samples taken after fasting for at least 8 hours.  Glucose, capillary     Status: Abnormal   Collection Time: 10/09/21  7:51 AM  Result Value Ref Range   Glucose-Capillary 142 (H) 70 - 99 mg/dL    Comment: Glucose reference range applies only to samples taken after fasting for at least 8 hours.  Basic metabolic panel     Status: Abnormal   Collection Time: 10/09/21  9:09 AM  Result Value Ref Range   Sodium 135 135 - 145 mmol/L   Potassium 3.9 3.5 - 5.1 mmol/L   Chloride 103 98 - 111 mmol/L   CO2 24 22 - 32 mmol/L   Glucose, Bld 211 (H) 70 - 99 mg/dL    Comment: Glucose reference range applies only to samples taken after fasting for at least 8 hours.   BUN 22 8 - 23 mg/dL   Creatinine, Ser 1.87 (H) 0.61 - 1.24 mg/dL   Calcium 8.5 (L) 8.9 - 10.3 mg/dL   GFR, Estimated 38 (L) >60 mL/min    Comment: (NOTE) Calculated using the CKD-EPI Creatinine Equation (2021)    Anion gap 8 5 - 15    Comment: Performed at [facility], [place] [address] [place], [place] [address]  CBC     Status: Abnormal   Collection Time: 10/09/21  9:09 AM  Result Value Ref Range   WBC 4.5 4.0 - 10.5 K/uL   RBC 3.13 (L) 4.22 - 5.81 MIL/uL   Hemoglobin 9.5 (L) 13.0 - 17.0 g/dL   HCT 29.0 (L) 39.0 - 52.0 %   MCV 92.7 80.0 - 100.0 fL   MCH 30.4 26.0 - 34.0 pg   MCHC 32.8 30.0 - 36.0 g/dL   RDW 13.3 11.5 - 15.5 %   Platelets 247 150 - 400 K/uL    nRBC 0.0 0.0 - 0.2 %    Comment: Performed at [facility], [place] [address] [place], [place] [address]  ABO/Rh     Status: None   Collection Time: 10/09/21  9:09 AM  Result Value Ref Range   ABO/RH(D)      A NEG Performed at [place] [address], [place], [place] [address]   Type and screen [place]     Status: None (Preliminary result)   Collection Time: 10/09/21 10:42 AM  Result Value Ref Range  ABO/RH(D) A NEG    Antibody Screen NEG    Sample Expiration 10/12/2021,2359    Unit Number [id number]    Blood Component Type RED CELLS,LR    Unit division 00    Status of Unit ISSUED    Transfusion Status OK TO TRANSFUSE    Crossmatch Result      Compatible Performed at [place] [address], [place], [place] [address]    Unit Number [id number]    Blood Component Type RED CELLS,LR    Unit division 00    Status of Unit ALLOCATED    Transfusion Status OK TO TRANSFUSE    Crossmatch Result Compatible   Prepare RBC (crossmatch)     Status: None   Collection Time: 10/09/21 10:42 AM  Result Value Ref Range   Order Confirmation      ORDER PROCESSED BY BLOOD BANK Performed at [facility], [place] [address], [place], [place] [address]   Glucose, capillary     Status: Abnormal   Collection Time: 10/09/21 11:12 AM  Result Value Ref Range   Glucose-Capillary 283 (H) 70 - 99 mg/dL    Comment: Glucose reference range applies only to samples taken after fasting for at least 8 hours.   No results found.  Anti-infectives (From admission, onward)    Start     Dose/Rate Route Frequency Ordered Stop   10/07/21 1000  cefTRIAXone (ROCEPHIN) 2 g in sodium chloride 0.9 % 100 mL IVPB        2 g 200 mL/hr over 30 Minutes Intravenous Every 24 hours 10/06/21 1757     10/06/21 2200  linezolid (ZYVOX) IVPB 600 mg        600 mg 300 mL/hr over 60 Minutes Intravenous Every 12 hours  10/06/21 1757 10/11/21 2159   10/06/21 1112  vancomycin variable dose per unstable renal function (pharmacist dosing)  Status:  Discontinued         Does not apply See admin instructions 10/06/21 1112 10/06/21 1838   10/06/21 1100  vancomycin (VANCOCIN) IVPB 1000 mg/200 mL premix        1,000 mg 200 mL/hr over 60 Minutes Intravenous  Once 10/06/21 1054 10/06/21 1404   10/06/21 1100  cefTRIAXone (ROCEPHIN) 2 g in sodium chloride 0.9 % 100 mL IVPB        2 g 200 mL/hr over 30 Minutes Intravenous  Once 10/06/21 1054 10/06/21 1200        Assessment/Plan Right buttock wound - Will obtain u/s to evaluate for underlying abscess. If present will need to hold xarelto prior to procedure.  ID - currently rocephin and linezolid VTE - xarelto FEN - HH/CM diet Foley - none  HTN DM HLD Hypothyroidism Transient bradycardia PAF on xarelto (last dose 11/22 in evening)  Wellington [place], [facility] Surgery 10/09/2021, 2:09 PM Please see Amion for pager number during day hours 7:00am-4:30pm

## 2021-10-09 NOTE — TOC Initial Note (Signed)
Transition of Care (TOC) - Initial/Assessment Note    Patient Details  Name: Jeremiah Beasley. MRN: [id number] Date of Birth: 05/23/1946  Transition of Care [facility]) CM/SW Contact:    Leeroy Cha, RN Phone Number: 10/09/2021, 7:51 AM  Clinical Narrative:                 Assessment/Plan: Principal Problem:   Hyponatremia Active Problems:   Generalized weakness   Generalized weakness and recurrent falls, unclear etiology Reported 5 falls this week, 1 at home, prior to presenting to [facility] ED Obtain orthostatic vital signs. TSH 5.005, follow-up free T4.   Continue gentle IV fluid hydration normal saline at 50 cc/h x 1 day. Presented with a right buttock wound/cellulitis, present for 2 months. Continue to treat empirically with linezolid and Rocephin, history of MRSA- Continue Florastor 250 mg twice daily. Continue to follow urine culture, blood cultures x2 peripherally. Closely monitor fever curve and WBC Replete electrolytes as indicated Treat underlying conditions Evaluated by PT OT with recommendation for home health PT OT. TOC consulted to assist with home health PT OT arrangement. Continue fall precautions   Transient bradycardia Hold off Norvasc, digoxin and amiodarone for now, avoid AV nodal blockade agents until seen by cardiology.   Discussed with cardiology, will see in consultation.   Anemia of chronic disease/chronic normocytic anemia in the setting of CKD 3B/chronic wound/iron deficiency anemia. Hemoglobin 10.2K, downtrending 8.1K Monitor H&H, repeat CBC in the morning, if hemoglobin low will consult GI. Follow iron studies.   Improving, hypovolemic hyponatremia Presented with serum sodium of 126, repeat 129. Serum sodium 130 on 10/07/2021. Hypovolemic on presentation Continue normal saline at 50 cc/h x 1 day Continue to closely monitor volume status while on IV fluid. Repeat BMP in the morning   Right buttock cellulitis/wound, POA Wound care specialist  consulted Continue local wound care. Continue broad-spectrum IV antibiotics Follow MRSA screen   Improving AKI on CKD 3B likely prerenal in the setting of dehydration and poor oral intake Baseline creatinine appears to be 1.51 with GFR of 52 Creatinine downtrending 1.92 from 2.07 Continue to avoid nephrotoxic agents, dehydration and hypotension Continue to closely monitor urine output with strict I's and O's Continue IV fluid hydration x1 day. Repeat BMP in the morning.   Type 2 diabetes on insulin pump with hyperglycemia Obtain hemoglobin A1c Serum glucose 169 Diabetes coordinator Hold off insulin pump for now Continue insulin sliding scale. Added Semglee 10 unit daily Add NovoLog 4 units 3 times daily   Diabetic polyneuropathy Continue home gabapentin   Paroxysmal A. fib on Xarelto Prior to admission on digoxin and amiodarone, hold off due to bradycardia. Continue to closely monitor on telemetry.   Essential hypertension BP stable. Continue home hydralazine at lower dose 10 mg 3 times daily Continue to closely monitor vital signs   Hyperlipidemia Follow CPK results prior to restarting home Zocor   Hypothyroidism TSH 5, follow-up free T4. Continue home levothyroxine   GERD Continue home PPI   Resolved elevated lactic acid On presentation lactic acid 2.7 Received 1 L IV fluid bolus in the ED Repeat lactic acid post IV fluid hydration normalized 0.9.   TOC PLAN OF CARE:  following for hhc pt and ot as needed.  Urine culture + for E.coli Iv rocephin ,   Expected Discharge Plan: [place] Barriers to Discharge: Continued Medical Work up   Patient Goals and CMS Choice Patient states their goals for this hospitalization and ongoing recovery  are:: i would like to go home CMS Medicare.gov Compare Post Acute Care list provided to:: Patient    Expected Discharge Plan and Services Expected Discharge Plan: [place]   Discharge  Planning Services: CM Consult   Living arrangements for the past 2 months: Single Family Home                                      Prior Living Arrangements/Services Living arrangements for the past 2 months: Single Family Home Lives with:: Spouse Patient language and need for interpreter reviewed:: Yes Do you feel safe going back to the place where you live?: Yes      Need for Family Participation in Patient Care: Yes (Comment) Care giver support system in place?: Yes (comment)   Criminal Activity/Legal Involvement Pertinent to Current Situation/Hospitalization: No - Comment as needed  Activities of Daily Living      Permission Sought/Granted                  Emotional Assessment Appearance:: Appears stated age Attitude/Demeanor/Rapport: Engaged Affect (typically observed): Calm Orientation: : Oriented to Place, Oriented to Self, Oriented to  Time, Oriented to Situation Alcohol / Substance Use: Not Applicable Psych Involvement: No (comment)  Admission diagnosis:  Hyponatremia [E87.1] Generalized weakness [R53.1] Patient Active Problem List   Diagnosis Date Noted   Hyponatremia 10/06/2021   Generalized weakness 10/06/2021   Type 1 diabetes with stage 3 chronic kidney disease moderate GFR 30-59 ([place]) 09/11/2021   Benign hypertension with chronic kidney disease, stage III ([place]) 09/11/2021   Hyperlipidemia 07/14/2021   Body mass index (BMI) 28.0-28.9, adult 01/10/2021   Chronic pain syndrome 01/10/2021   On anticoagulant therapy 01/10/2021   Primary osteoarthritis of right knee 01/10/2021   Hypertension    Atrial fibrillation ([place])    Pneumonia 10/20/2020   S/P right knee arthroscopy 04/25/2020   Chronic pain of right knee 04/11/2020   Essential (primary) hypertension 05/15/2015   Paroxysmal atrial fibrillation ([place]) 05/15/2015   Pure hypercholesterolemia 05/15/2015   Type 2 diabetes mellitus without complication ([place]) 48/54/6270   PCP:  [name], MD Pharmacy:   [facility], [place] [address] Phone: [phone] Fax: [phone]  [id number] - [address], [place] [address] Phone: [phone] Fax: [phone]     Social Determinants of Health (SDOH) Interventions    Readmission Risk Interventions No flowsheet data found.

## 2021-10-09 NOTE — Progress Notes (Signed)
Inpatient Diabetes Program Recommendations  AACE/ADA: New Consensus Statement on Inpatient Glycemic Control (2015)  Target Ranges:  Prepandial:   less than 140 mg/dL      Peak postprandial:   less than 180 mg/dL (1-2 hours)      Critically ill patients:  140 - 180 mg/dL    Latest Reference Range & Units 10/09/21 07:51 10/09/21 11:12  Glucose-Capillary 70 - 99 mg/dL 142 (H)  5 units Novolog  16 units Semglee  283 (H)     Home DM Meds: Insulin Pump   Current Orders: Semglee 16 units Daily                            Novolog Sensitive Correction Scale/ SSI (0-9 units) TID AC + HS                            Novolog 4 units TID with meals    MD- Please consider increasing Novolog Meal Coverage to 6 units TID with meals     ENDO: [facility] Last seen 08/09/2021 Insulin Pump Settings were as follows: Insulin: Lispro (Humalog) Pump Brand/Model: Medtronic  Basal Total Daily Basal Insulin: 32.1  Basal Time: 00:00 (1) Basal Rate (Units/Hr): 1.4  Basal Time: 04:00 (2) Basal Rate (Units/Hr): 1.5  Basal Time: 07:00 (3) Basal Rate (Units/Hr): 1.4  Basal Time: 18:00 (4) Basal Rate (Units/Hr): 1.1   Insulin:Carb ICR Time: 00:00 (1) ICR : 9.5 ICR Time: 06:00 (2) ICR : 7 ICR Time: 11:00 (3) ICR: 8  ICR Time: 18:00 (4) ICR: 7.5  ICR Time: 21:00 (5) ICR: 8.5   Sensitivity ISF Time: 00:00 (1) ISF: 20  Target BG Time: 00:00 (1) Target BG (mg/dL): 140  BG Time: 07:30 (2) Target BG (mg/dL): 130  BG Time: 22:00 (3) Target BG (mg/dL): 140     --Will follow patient during hospitalization--  Wyn Quaker RN, MSN, CDE Diabetes Coordinator Inpatient Glycemic Control Team Team Pager: [phone] (8a-5p)

## 2021-10-09 NOTE — Progress Notes (Addendum)
PROGRESS NOTE  Jeremiah Beasley. UKG:[id number] DOB: Oct 11, 1946 DOA: 10/06/2021 PCP: [name], MD  HPI/Recap of past 24 hours: Jeremiah Beasley. is a 75 y.o. male with medical history significant for paroxysmal atrial fibrillation on Xarelto, transient bradycardia followed by cardiology, type 2 diabetes on insulin pump, diabetic polyneuropathy, GERD, essential hypertension, hyperlipidemia, hypothyroidism, intolerance to ACE inhibitor due to tongue swelling, who presented to [facility] ED, brought in by his wife and son due to generalized weakness and multiple falls, 5 times this week, including 1 fall prior to presenting to [facility] ED.  Associated with poor oral intake.  Endorses a chronic right buttock wound present for 2 months, intermittent fevers at home with T-max 101.2 a few days prior to presentation.  Per his son at bedside his weakness has rapidly progressed for the past 2 weeks to the point where he shakes when he stands up due to his generalized weakness.  Has had unintentional weight loss, 24 pounds in the last 3 months due to lack of appetite and poor oral intake.     Per his wife at bedside his decline started almost a year ago after his gallbladder surgery.  He had a prolonged hospital stay and since then he has progressively declined.  Went from ambulating with a cane to needing a walker to ambulate short distances.     Upon presentation to the ED, patient appears weak and severely debilitated.  Per his son at bedside, he had an episode of staring off and gurgling while in the waiting room in the ED, unclear if the patient had a syncopal episode.  There were no witnessed seizure-like activity.     Work-up in the ED revealed hypovolemic hyponatremia, AKI, likely prerenal.  Patient was admitted as a direct admit from [facility] ED to [facility] progressive care unit by Dr. Olevia Bowens, from [facility], Hospitalist service.  Obstacles complicated by transient bradycardia for which cardiology was  consulted.  Also complicated by refractory chronic right buttock wound, general surgery consulted.  10/09/2021: Patient was seen and examined at his bedside.  His wife was present in the room.  Reports pain in his right buttocks at the site of his chronic wound.  Assessment/Plan: Principal Problem:   Hyponatremia Active Problems:   Generalized weakness  Generalized weakness and recurrent falls, suspect multifactorial Reported 5 falls this week, 1 at home, prior to presenting to [facility] ED -Positive orthostatic vital signs. Repeat orthostatic vital signs after receiving IV fluid hydration. TSH 5.005, free T4 1.13. -Infection from right buttock wound/cellulitis, present for 2 months. Continue to treat empirically with linezolid and Rocephin, history of MRSA- Continue Florastor 250 mg twice daily. Continue to follow urine culture, blood cultures x2 peripherally. Blood cultures negative to date Urine culture collected on 10/06/2021 grew 100,000 colonies of Enterococcus faecalis. Closely monitor fever curve and WBC Replete electrolytes as indicated Continue to treat underlying conditions Evaluated by PT OT with recommendation for home health PT OT. TOC consulted to assist with home health PT OT arrangement. Continue fall precautions  Transient bradycardia Continue to hold off Norvasc, digoxin and amiodarone Continue to avoid AV nodal blockade agents until seen by cardiology.   Management per cardiology. 2D echo pending  Refractory right buttock cellulitis/wound, POA Wound care specialist consulted Continue local wound care. Continue broad-spectrum IV antibiotics Follow MRSA screen General surgery consulted to assist with the management.  Anemia of chronic disease/chronic normocytic anemia in the setting of CKD 3B/chronic blood loss from chronic wound/iron deficiency  anemia/Acute blood loss anemia No overt bleeding. Hemoglobin dropped to 7.7K from 10.2K 1 unit PRBC ordered to be  transfused for hemoglobin of 7.7. Iron deficiency noted on iron studies. GI consulted, plan for EGD on 10/10/2021, patient declined colonoscopy at this time per GI.   Resolved hypovolemic hyponatremia Presented with serum sodium of 126. Serum sodium 135 on 10/09/2021 post IV fluid hydration normal saline at 50 cc/h.. Encouraged to increase oral solute intake.   Improving AKI on CKD 3B likely prerenal in the setting of dehydration and poor oral intake Baseline creatinine appears to be 1.51 with GFR of 52 Creatinine downtrending 1.87 from 1.92 from 2.07 Continue to avoid nephrotoxic agents, dehydration and hypotension Continue to closely monitor urine output with strict I's and O's   Type 2 diabetes on insulin pump with hyperglycemia Hemoglobin A1c 6.0 on 10/07/2021 Diabetes coordinator following Continue to hold off insulin pump for now Continue insulin sliding scale. Semglee 16 units daily Continue NovoLog 4 units 3 times daily   Diabetic polyneuropathy Continue home gabapentin   Paroxysmal A. fib on Xarelto Prior to admission on digoxin and amiodarone, hold off due to bradycardia. Continue to closely monitor on telemetry.   Essential hypertension BP stable. Continue home hydralazine at lower dose 10 mg 3 times daily Continue to closely monitor vital signs   Hyperlipidemia Follow CPK results prior to restarting home Zocor   Hypothyroidism TSH 5, free T4 113. Continue home levothyroxine Repeat TSH outpatient in 6 weeks after acute illness has resolved.   GERD Continue home PPI   Resolved elevated lactic acid On presentation lactic acid 2.7 Received 1 L IV fluid bolus in the ED Repeat lactic acid post IV fluid hydration normalized 0.9.  Ambulatory dysfunction PT OT assessed and recommended home health PT OT. Continue fall precautions  Critical care time: 65 minutes.       DVT prophylaxis: Xarelto, held for possible EGD in the morning.   Code Status: Full  code   Family Communication: Updated patient's wife via phone.   Disposition Plan: Admitted to progressive unit   Consults called: Cardiology   Admission status: Inpatient status.     Status is: Inpatient   Inpatient status.  Patient will require at least 2 midnights for further evaluation and treatment of present condition.       Objective: Vitals:   10/09/21 0531 10/09/21 1214 10/09/21 1343 10/09/21 1358  BP: 136/65 (!) 135/55 130/68 128/76  Pulse: (!) 44 (!) 42 (!) 41 (!) 40  Resp:  18 (!) 22 18  Temp: 98 F (36.7 C) 97.7 F (36.5 C) 97.7 F (36.5 C) 98 F (36.7 C)  TempSrc: Oral Oral Oral Oral  SpO2: 97% 97% 98% 98%  Weight:      Height:        Intake/Output Summary (Last 24 hours) at 10/09/2021 1514 Last data filed at 10/09/2021 1358 Gross per 24 hour  Intake 750 ml  Output 1430 ml  Net -680 ml   Filed Weights   10/07/21 0329 10/08/21 0500 10/09/21 0500  Weight: 85.1 kg 85.3 kg 85.4 kg    Exam:  General: 75 y.o. year-old male well-developed well-nourished in no acute distress.  He is alert and oriented x3.   Cardiovascular: Regular rate and rhythm no rubs or gallops.  Respiratory: Clear to auscultation with no wheezes or rales.   Abdomen: Soft nontender normal bowel sounds present.  Musculoskeletal: No lower extremity edema bilaterally.   Skin: No ulcerative lesions noted. Psychiatry: Mood  is appropriate for condition and setting. Neuro: Moves all 4 extremities equally.  Nonfocal.  Data Reviewed: CBC: Recent Labs  Lab 10/06/21 1216 10/06/21 1653 10/07/21 0446 10/08/21 0436 10/09/21 0909  WBC 9.1 4.6 3.6* 3.6* 4.5  NEUTROABS 7.3 3.7  --  2.3  --   HGB 10.2* 8.4* 8.1* 7.7* 9.5*  HCT 31.2* 25.6* 24.3* 23.5* 29.0*  MCV 90.2 90.1 89.7 92.5 92.7  PLT 177 186 196 191 185   Basic Metabolic Panel: Recent Labs  Lab 10/06/21 0920 10/06/21 1216 10/07/21 0446 10/08/21 0436 10/09/21 0909  NA 126* 129* 130* 132* 135  K 3.6 4.0 4.0 4.1 3.9  CL  94* 94* 101 103 103  CO2 23 25 22 24 24   GLUCOSE 154* 169* 312* 254* 211*  BUN 28* 27* 26* 25* 22  CREATININE 1.96* 2.07* 1.92* 1.94* 1.87*  CALCIUM 8.4* 8.6* 7.9* 7.9* 8.5*  MG  --   --  1.9 2.0  --   PHOS  --   --  3.1 3.5  --    GFR: Estimated Creatinine Clearance: 38.7 mL/min (A) (by C-G formula based on SCr of 1.87 mg/dL (H)). Liver Function Tests: Recent Labs  Lab 10/06/21 1216 10/07/21 0446  AST 30 20  ALT 25 18  ALKPHOS 84 64  BILITOT 0.6 0.9  PROT 7.8 6.2*  ALBUMIN 3.5 2.7*   No results for input(s): LIPASE, AMYLASE in the last 168 hours. Recent Labs  Lab 10/06/21 1216  AMMONIA 14   Coagulation Profile: Recent Labs  Lab 10/06/21 1216  INR 2.2*   Cardiac Enzymes: Recent Labs  Lab 10/07/21 0446 10/08/21 0436  CKTOTAL 32* 22*   BNP (last 3 results) No results for input(s): PROBNP in the last 8760 hours. HbA1C: Recent Labs    10/07/21 0446  HGBA1C 6.0*   CBG: Recent Labs  Lab 10/08/21 1156 10/08/21 1626 10/08/21 1941 10/09/21 0751 10/09/21 1112  GLUCAP 233* 181* 136* 142* 283*   Lipid Profile: No results for input(s): CHOL, HDL, LDLCALC, TRIG, CHOLHDL, LDLDIRECT in the last 72 hours. Thyroid Function Tests: Recent Labs    10/07/21 0446 10/07/21 1104  TSH 5.005*  --   FREET4  --  1.13*   Anemia Panel: Recent Labs    10/07/21 0441 10/07/21 0446  FERRITIN 73  --   TIBC 178*  --   IRON 29*  --   RETICCTPCT  --  1.9   Urine analysis:    Component Value Date/Time   COLORURINE YELLOW 10/06/2021 [place] 10/06/2021 1216   LABSPEC 1.010 10/06/2021 1216   PHURINE 5.5 10/06/2021 1216   GLUCOSEU NEGATIVE 10/06/2021 1216   HGBUR TRACE (A) 10/06/2021 1216   [place] 10/06/2021 [place] 10/06/2021 1216   PROTEINUR NEGATIVE 10/06/2021 1216   NITRITE NEGATIVE 10/06/2021 1216   LEUKOCYTESUR NEGATIVE 10/06/2021 1216   Sepsis Labs: @LABRCNTIP (procalcitonin:4,lacticidven:4)  ) Recent  Results (from the past 240 hour(s))  Blood culture (routine x 2)     Status: None (Preliminary result)   Collection Time: 10/06/21  9:50 AM   Specimen: BLOOD RIGHT ARM  Result Value Ref Range Status   Specimen Description   Final    BLOOD RIGHT ARM BLOOD Performed at [facility], [address] [place], [place] [address]    Special Requests   Final    Blood Culture adequate volume BOTTLES DRAWN AEROBIC AND ANAEROBIC Performed at [facility], [address]  [place], [place] [address]    Culture   Final    NO GROWTH 3 DAYS Performed at [facility], [place] [address], [place], [place] [address]    Report Status PENDING  Incomplete  Resp Panel by RT-PCR (Flu A&B, Covid) Nasopharyngeal Swab     Status: None   Collection Time: 10/06/21 10:14 AM   Specimen: Nasopharyngeal Swab; Nasopharyngeal(NP) swabs in vial transport medium  Result Value Ref Range Status   SARS Coronavirus 2 by RT PCR NEGATIVE NEGATIVE Final    Comment: (NOTE) SARS-CoV-2 target nucleic acids are NOT DETECTED.  The SARS-CoV-2 RNA is generally detectable in upper respiratory specimens during the acute phase of infection. The lowest concentration of SARS-CoV-2 viral copies this assay can detect is 138 copies/mL. A negative result does not preclude SARS-Cov-2 infection and should not be used as the sole basis for treatment or other patient management decisions. A negative result may occur with  improper specimen collection/handling, submission of specimen other than nasopharyngeal swab, presence of viral mutation(s) within the areas targeted by this assay, and inadequate number of viral copies(<138 copies/mL). A negative result must be combined with clinical observations, patient history, and epidemiological information. The expected result is Negative.  Fact Sheet for Patients:  EntrepreneurPulse.com.au  Fact Sheet for Healthcare Providers:   IncredibleEmployment.be  This test is no t yet approved or cleared by the [place] FDA and  has been authorized for detection and/or diagnosis of SARS-CoV-2 by FDA under an Emergency Use Authorization (EUA). This EUA will remain  in effect (meaning this test can be used) for the duration of the COVID-19 declaration under Section 564(b)(1) of the Act, 21 U.S.C.section 360bbb-3(b)(1), unless the authorization is terminated  or revoked sooner.       Influenza A by PCR NEGATIVE NEGATIVE Final   Influenza B by PCR NEGATIVE NEGATIVE Final    Comment: (NOTE) The Xpert Xpress SARS-CoV-2/FLU/RSV plus assay is intended as an aid in the diagnosis of influenza from Nasopharyngeal swab specimens and should not be used as a sole basis for treatment. Nasal washings and aspirates are unacceptable for Xpert Xpress SARS-CoV-2/FLU/RSV testing.  Fact Sheet for Patients: EntrepreneurPulse.com.au  Fact Sheet for Healthcare Providers: IncredibleEmployment.be  This test is not yet approved or cleared by the [place] FDA and has been authorized for detection and/or diagnosis of SARS-CoV-2 by FDA under an Emergency Use Authorization (EUA). This EUA will remain in effect (meaning this test can be used) for the duration of the COVID-19 declaration under Section 564(b)(1) of the Act, 21 U.S.C. section 360bbb-3(b)(1), unless the authorization is terminated or revoked.  Performed at [facility], [address], [place], [place] [address]   Urine Culture     Status: Abnormal   Collection Time: 10/06/21 12:16 PM   Specimen: In/Out Cath Urine  Result Value Ref Range Status   Specimen Description   Final    IN/OUT CATH URINE Performed at [facility], [address] [place], [place] [address]    Special Requests   Final    NONE Performed at [facility], [address] [place], [place] [address]     Culture 1,000 COLONIES/mL ENTEROCOCCUS FAECALIS (A)  Final   Report Status 10/08/2021 FINAL  Final   Organism ID, Bacteria ENTEROCOCCUS FAECALIS (A)  Final      Susceptibility   Enterococcus faecalis - MIC*    AMPICILLIN <=2 SENSITIVE Sensitive     NITROFURANTOIN <=  16 SENSITIVE Sensitive     VANCOMYCIN 1 SENSITIVE Sensitive     * 1,000 COLONIES/mL ENTEROCOCCUS FAECALIS  Blood Culture (routine x 2)     Status: None (Preliminary result)   Collection Time: 10/06/21 12:30 PM   Specimen: Left Antecubital; Blood  Result Value Ref Range Status   Specimen Description   Final    LEFT ANTECUBITAL Performed at [facility], [address] [place], [place] [address]    Special Requests   Final    BOTTLES DRAWN AEROBIC AND ANAEROBIC Blood Culture adequate volume Performed at [facility], [address] [place], [place] [address]    Culture   Final    NO GROWTH 3 DAYS Performed at [facility], [place] [address], [place], [place] [address]    Report Status PENDING  Incomplete  Blood culture (routine x 2)     Status: None (Preliminary result)   Collection Time: 10/06/21  4:53 PM   Specimen: BLOOD  Result Value Ref Range Status   Specimen Description   Final    BLOOD BLOOD RIGHT FOREARM Performed at [place] [address] [place], [place] [address]    Special Requests   Final    BOTTLES DRAWN AEROBIC ONLY Blood Culture adequate volume Performed at [place] [address] [place], [place] [address]    Culture   Final    NO GROWTH 2 DAYS Performed at [place] [address] [place], [place] [address]    Report Status PENDING  Incomplete  Blood Culture (routine x 2)     Status: None (Preliminary result)   Collection Time: 10/06/21  4:53 PM   Specimen: BLOOD  Result Value Ref Range Status   Specimen Description   Final    BLOOD BLOOD RIGHT HAND Performed at [place]  [address], [place], [place] [address]    Special Requests   Final    BOTTLES DRAWN AEROBIC ONLY Blood Culture adequate volume Performed at [place] [address] [place], [place] [address]    Culture   Final    NO GROWTH 2 DAYS Performed at [facility] [address] [place], [place] [address]    Report Status PENDING  Incomplete      Studies: No results found.  Scheduled Meds:  gabapentin  300 mg Oral BID   hydrALAZINE  10 mg Oral Q8H   insulin aspart  0-5 Units Subcutaneous QHS   insulin aspart  0-9 Units Subcutaneous TID WC   insulin aspart  4 Units Subcutaneous TID WC   insulin glargine-yfgn  16 Units Subcutaneous Daily   levothyroxine  75 mcg Oral Q0600   pantoprazole  40 mg Oral Daily   saccharomyces boulardii  250 mg Oral BID    Continuous Infusions:  cefTRIAXone (ROCEPHIN)  IV 2 g (10/09/21 0848)   linezolid (ZYVOX) IV 600 mg (10/09/21 1026)     LOS: 3 days     Kayleen Memos, MD [facility] Pager [phone]  If 7PM-7AM, please contact night-coverage [email or web address] Password [facility] 10/09/2021, 3:14 PM

## 2021-10-09 NOTE — Progress Notes (Signed)
Progress Note  Patient Name: Jeremiah Beasley. Date of Encounter: 10/09/2021  [facility] Cardiologist: [name], MD   Subjective   No CP or dyspnea  Inpatient Medications    Scheduled Meds:  gabapentin  300 mg Oral BID   hydrALAZINE  10 mg Oral Q8H   insulin aspart  0-5 Units Subcutaneous QHS   insulin aspart  0-9 Units Subcutaneous TID WC   insulin aspart  4 Units Subcutaneous TID WC   insulin glargine-yfgn  16 Units Subcutaneous Daily   levothyroxine  75 mcg Oral Q0600   pantoprazole  40 mg Oral Daily   rivaroxaban  15 mg Oral q1800   saccharomyces boulardii  250 mg Oral BID   Continuous Infusions:  cefTRIAXone (ROCEPHIN)  IV 2 g (10/08/21 0936)   linezolid (ZYVOX) IV 600 mg (10/08/21 2117)   PRN Meds: acetaminophen, HYDROmorphone (DILAUDID) injection, melatonin, ondansetron (ZOFRAN) IV, oxyCODONE, polyethylene glycol   Vital Signs    Vitals:   10/08/21 1158 10/08/21 2043 10/09/21 0500 10/09/21 0531  BP: (!) 130/54 134/64  136/65  Pulse: (!) 43 (!) 55  (!) 44  Resp: 18 16    Temp: 98.3 F (36.8 C) 98.2 F (36.8 C)  98 F (36.7 C)  TempSrc: Oral Oral  Oral  SpO2: 96% 96%  97%  Weight:   85.4 kg   Height:        Intake/Output Summary (Last 24 hours) at 10/09/2021 0825 Last data filed at 10/09/2021 0558 Gross per 24 hour  Intake 720 ml  Output 1430 ml  Net -710 ml   Last 3 Weights 10/09/2021 10/08/2021 10/07/2021  Weight (lbs) 188 lb 4.4 oz 188 lb 0.8 oz 187 lb 11.2 oz  Weight (kg) 85.4 kg 85.3 kg 85.14 kg      Telemetry    Sinus bradycardia with PVCs - Personally Reviewed   Physical Exam   GEN: No acute distress.   Neck: No JVD Cardiac: regular and bradycardic Respiratory: Clear to auscultation bilaterally. GI: Soft, nontender, non-distended  MS: No edema Neuro:  Nonfocal  Psych: Normal affect   Labs   Chemistry Recent Labs  Lab 10/06/21 1216 10/07/21 0446 10/08/21 0436  NA 129* 130* 132*  K 4.0 4.0 4.1  CL 94* 101  103  CO2 25 22 24   GLUCOSE 169* 312* 254*  BUN 27* 26* 25*  CREATININE 2.07* 1.92* 1.94*  CALCIUM 8.6* 7.9* 7.9*  MG  --  1.9 2.0  PROT 7.8 6.2*  --   ALBUMIN 3.5 2.7*  --   AST 30 20  --   ALT 25 18  --   ALKPHOS 84 64  --   BILITOT 0.6 0.9  --   GFRNONAA 33* 37* 36*  ANIONGAP 10 7 5    Hematology Recent Labs  Lab 10/06/21 1653 10/07/21 0446 10/08/21 0436  WBC 4.6 3.6* 3.6*  RBC 2.84* 2.71*  2.73* 2.54*  HGB 8.4* 8.1* 7.7*  HCT 25.6* 24.3* 23.5*  MCV 90.1 89.7 92.5  MCH 29.6 29.9 30.3  MCHC 32.8 33.3 32.8  RDW 13.1 13.0 13.1  PLT 186 196 191   Thyroid  Recent Labs  Lab 10/07/21 0446 10/07/21 1104  TSH 5.005*  --   FREET4  --  1.13*    BNP Recent Labs  Lab 10/06/21 1216  BNP 88.7     Patient Profile     75 year old male with past medical history of paroxysmal atrial fibrillation, history of bradycardia, diabetes mellitus, hypertension, hyperlipidemia,  chronic stage III-IV kidney disease for evaluation of bradycardia.  Last echocardiogram April 2021 showed normal LV function.  Nuclear study at that time showed no ischemia.  Monitor October 2022 showed sinus rhythm with minimum heart rate 39 and maximum 158, 4 beats of nonsustained ventricular tachycardia, brief PAT but no prolonged pauses.  Patient has had progressive weakness over the past 1 year following episode of cholecystitis.  He now has difficulty ambulating and has fallen multiple times but denies presyncope or syncope. He was admitted with generalized weakness and heart rate in the 40s.  Cardiology now asked to evaluate.  Assessment & Plan    1 bradycardia-I have personally reviewed the patient's telemetry.  He remains in a sinus bradycardia predominantly in the 40s but this morning he is in the 50s.  There are no prolonged pauses noted.  It is not clear that bradycardia is contributing to his weakness as outlined in previous note.  Would continue off of digoxin and amiodarone.  No indication for  pacemaker at this point.  Follow-up Dr. Agustin Cree following discharge.  We will arrange outpatient echocardiogram as well.     2 generalized weakness-etiology unclear.  Further management per primary care.   3 hypertension-blood pressure controlled.  Continue present medications and follow.   4 paroxysmal atrial fibrillation-as outlined above given bradycardia will discontinue digoxin and amiodarone.  Can continue Xarelto for now.  However if he falls frequently will need to consider discontinuing due to risk outweighing benefit.  Patient can be discharged from a cardiac standpoint.  We will arrange follow-up with Dr. Agustin Cree in 2 to 4 weeks.  Continue present medications at discharge.  Please call with questions.  For questions or updates, please contact [place] Please consult [email or web address] for contact info under        Signed, Kirk Ruths, MD  10/09/2021, 8:25 AM

## 2021-10-09 NOTE — Anesthesia Preprocedure Evaluation (Addendum)
Anesthesia Evaluation  Patient identified by MRN, date of birth, ID band Patient awake    Reviewed: Allergy & Precautions, NPO status , Patient's Chart, lab work & pertinent test results  History of Anesthesia Complications Negative for: history of anesthetic complications  Airway Mallampati: II  TM Distance: >3 FB Neck ROM: Full    Dental  (+) Dental Advisory Given, Edentulous Upper   Pulmonary neg pulmonary ROS,    Pulmonary exam normal        Cardiovascular hypertension, Pt. on medications Normal cardiovascular exam+ dysrhythmias Atrial Fibrillation      Neuro/Psych negative neurological ROS  negative psych ROS   GI/Hepatic negative GI ROS, Neg liver ROS,   Endo/Other  diabetes, Type 2, Insulin DependentHypothyroidism  Insulin pump   Renal/GU negative Renal ROS     Musculoskeletal  (+) Arthritis ,   Abdominal   Peds  Hematology  (+) anemia ,  On xarelto   Anesthesia Other Findings   Reproductive/Obstetrics                            Anesthesia Physical Anesthesia Plan  ASA: 3  Anesthesia Plan: MAC   Post-op Pain Management: Minimal or no pain anticipated   Induction:   PONV Risk Score and Plan: 1 and Propofol infusion and Treatment may vary due to age or medical condition  Airway Management Planned: Nasal Cannula and Natural Airway  Additional Equipment: None  Intra-op Plan:   Post-operative Plan:   Informed Consent: I have reviewed the patients History and Physical, chart, labs and discussed the procedure including the risks, benefits and alternatives for the proposed anesthesia with the patient or authorized representative who has indicated his/her understanding and acceptance.       Plan Discussed with: CRNA and Anesthesiologist  Anesthesia Plan Comments:        Anesthesia Quick Evaluation

## 2021-10-09 NOTE — Progress Notes (Signed)
Physical Therapy Treatment Patient Details Name: Jeremiah Beasley. MRN: [id number] DOB: 1946-10-08 Today's Date: 10/09/2021   History of Present Illness Pt admitted from home with c/o weakness and falls and dx with hyponatremia.  Pt with hx of DM, p-af, diabetic polyneuropathy, and chronic R knee pain (scope 04/25/21).    PT Comments    Pt continues to participate. He remains unsteady when mobilizing. He tolerated distance well. Will continue to follow and progress activity as tolerated.    Recommendations for follow up therapy are one component of a multi-disciplinary discharge planning process, led by the attending physician.  Recommendations may be updated based on patient status, additional functional criteria and insurance authorization.  Follow Up Recommendations  Home health PT     Assistance Recommended at Discharge Frequent or constant Supervision/Assistance  Equipment Recommendations  None recommended by PT    Recommendations for Other Services       Precautions / Restrictions Precautions Precautions: Fall Precaution Comments: multiple falls recently Restrictions Weight Bearing Restrictions: No     Mobility  Bed Mobility               General bed mobility comments: oob in recliner    Transfers Overall transfer level: Needs assistance Equipment used: Rolling walker (2 wheels) Transfers: Sit to/from Stand Sit to Stand: Min guard           General transfer comment: for safety. cues to steady himself once standing and before taking any steps    Ambulation/Gait Ambulation/Gait assistance: Min assist Gait Distance (Feet): 120 Feet Assistive device: Rolling walker (2 wheels) Gait Pattern/deviations: Step-through pattern;Decreased stride length       General Gait Details: Assist to steady throughout distance. Cues for safety. Pt tolerated distance well.   Stairs             Wheelchair Mobility    Modified Rankin (Stroke Patients Only)        Balance Overall balance assessment: Needs assistance Sitting-balance support: Feet supported;No upper extremity supported Sitting balance-Leahy Scale: Good     Standing balance support: Bilateral upper extremity supported;Reliant on assistive device for balance Standing balance-Leahy Scale: Poor Standing balance comment: reliant on walker with ambulaton                            Cognition Arousal/Alertness: Awake/alert Behavior During Therapy: WFL for tasks assessed/performed Overall Cognitive Status: Within Functional Limits for tasks assessed                                 General Comments: wife was present during session as well.        Exercises      General Comments        Pertinent Vitals/Pain Pain Assessment: Faces Faces Pain Scale: Hurts little more Pain Location: buttock wound Pain Descriptors / Indicators: Discomfort;Sore Pain Intervention(s): Monitored during session    Home Living                          Prior Function            PT Goals (current goals can now be found in the care plan section) Progress towards PT goals: Progressing toward goals    Frequency    Min 3X/week      PT Plan Current plan remains appropriate    Co-evaluation  AM-PAC PT "6 Clicks" Mobility   Outcome Measure  Help needed turning from your back to your side while in a flat bed without using bedrails?: None Help needed moving from lying on your back to sitting on the side of a flat bed without using bedrails?: A Little Help needed moving to and from a bed to a chair (including a wheelchair)?: A Little Help needed standing up from a chair using your arms (e.g., wheelchair or bedside chair)?: A Little Help needed to walk in hospital room?: A Little Help needed climbing 3-5 steps with a railing? : A Little 6 Click Score: 19    End of Session Equipment Utilized During Treatment: Gait belt Activity  Tolerance: Patient tolerated treatment well Patient left: in chair;with call bell/phone within reach;with family/visitor present   PT Visit Diagnosis: Difficulty in walking, not elsewhere classified (R26.2);Muscle weakness (generalized) (M62.81)     Time: 1610-9604 PT Time Calculation (min) (ACUTE ONLY): 9 min  Charges:  $Gait Training: 8-22 mins                         Doreatha Massed, PT Acute Rehabilitation  Office: [phone] Pager: [phone]

## 2021-10-09 NOTE — H&P (View-Only) (Signed)
Referring Provider: [facility] Primary Care Physician:  [name], [name], MD Primary Gastroenterologist:  Althia Forts  Reason for Consultation:  Chronic iron deficiency anemia and fatigue  HPI: Jeremiah Beasley. is a 75 y.o. male with medical history significant for paroxysmal atrial fibrillation on Xarelto, transient bradycardia followed by cardiology, type 2 diabetes on insulin pump, diabetic polyneuropathy, GERD, essential hypertension, hyperlipidemia, hypothyroidism, intolerance to ACE inhibitor due to tongue swelling, who presented to [facility] ED, brought in by his wife and son due to generalized weakness and multiple falls, 5 times this week, including 1 fall prior to presenting to [facility] ED.  Associated with poor oral intake, a wound on his right buttock present for 2 months, intermittent fevers at home with T-max 101.2 a few days prior to presentation.  Per his family  at bedside his weakness has rapidly progressed for the past 2 weeks to the point where he shakes when he stands up due to his generalized weakness.  Has had unintentional weight loss, roughly 20 pounds in the last 3 months due to lack of appetite and poor oral intake.    GI consulted to evaluate chronic iron deficiency anemia and fatigue. Patient resting comfortably in chair this morning, his wife is with him. Patient states he has only seen GI for a colonoscopy 15 years ago.  Last colonoscopy was normal, without polyps, 10 year repeat recommended. Patient has never had an upper endoscopy.  Patient states he has been having worsening fatigue and weakness since 10/2020 when he was hospitalized for a ruptured gallbladder and sepsis. Since then, he has been seen for iron infusions with his PCP [facility] in [place]) - had two infusions in July for iron def anemia. He was scheduled for a repeat colonoscopy in August 2022 but cancelled due to a MRSA infection.   Patient denies dysphagia, nausea, and vomiting, though he did have  one episode of vomiting yesterday after eating a cup of pudding. Denies hematemesis. Patient has a history of GERD, controlled on Omeprazole 40 mg daily. Denies heartburn or reflux. Denies abdominal pain, diarrhea, hematochezia, melena. Patient has long history of constipation. Usually has two bowel movements per week. Last BM was Thursday, 11/17.   Patient's wife states he has lost his appetite and has had about 17 pounds of weight loss in the last 3 months. Denies family history of GI malignancies.   Patient is on Xarelto for A. Fib. Last dose given 11/21 at 17:07.    Lab Results  Component Value Date   WBC 3.6 (L) 10/08/2021   HGB 7.7 (L) 10/08/2021   HCT 23.5 (L) 10/08/2021   MCV 92.5 10/08/2021   PLT 191 10/08/2021      Past Medical History:  Diagnosis Date   Atrial fibrillation (HCC)    Chronic pain of right knee 04/11/2020   Diabetes mellitus without complication ([place])    Essential hypertension 05/15/2015   Hyperlipidemia    Hypertension    MRSA (methicillin resistant staph aureus) culture positive    Paroxysmal atrial fibrillation ([place]) 05/15/2015   Pneumonia 10/20/2020   Pure hypercholesterolemia 05/15/2015   S/P right knee arthroscopy 04/25/2020   Type 2 diabetes mellitus without complication ([place]) 69/62/9528    Past Surgical History:  Procedure Laterality Date   gall bladder removed   10/20/2020   Iron infusion  05/18/2021   x2   NO PAST SURGERIES      Prior to Admission medications   Medication Sig Start Date End Date Taking? Authorizing Provider  amiodarone (PACERONE) 200 MG tablet Take 100 mg by mouth daily. 11/12/16  Yes [provider]  amLODipine (NORVASC) 5 MG tablet Take 5 mg by mouth at bedtime.   Yes [provider]  digoxin (LANOXIN) 0.125 MG tablet Take 0.125 mg by mouth every other day.   Yes [provider]  diphenhydrAMINE (BENADRYL) 25 mg capsule Take 25 mg by mouth at bedtime.   Yes [provider]   ezetimibe (ZETIA) 10 MG tablet Take 1 tablet (10 mg total) by mouth daily. 06/08/20 11/05/21 Yes Park Liter, MD  furosemide (LASIX) 20 MG tablet TAKE ONE (1) TABLET BY MOUTH EVERY DAY. Patient taking differently: Take 20 mg by mouth in the morning. 04/02/21  Yes Park Liter, MD  gabapentin (NEURONTIN) 300 MG capsule Take 1 capsule (300 mg total) by mouth 2 (two) times daily. 09/24/21  Yes Patel, Donika K, DO  hydrALAZINE (APRESOLINE) 100 MG tablet Take 100 mg by mouth 3 (three) times daily. 01/25/21  Yes [provider]  Insulin Human (INSULIN PUMP) SOLN Inject 1 each into the skin continuous.   Yes [provider]  levothyroxine (SYNTHROID, LEVOTHROID) 75 MCG tablet Take 75 mcg by mouth daily before breakfast. 05/22/16  Yes [provider]  Multiple Vitamins-Minerals (CENTRUM SILVER ADULT 50+ PO) Take 1 tablet by mouth daily with breakfast.   Yes [provider]  omeprazole (PRILOSEC) 40 MG capsule Take 40 mg by mouth daily before supper. 02/16/21  Yes [provider]  rivaroxaban (XARELTO) 20 MG TABS tablet Take 20 mg by mouth daily with supper. 10/08/16  Yes [provider]  simvastatin (ZOCOR) 80 MG tablet Take 80 mg by mouth daily. 03/12/19  Yes [provider]  amLODipine (NORVASC) 10 MG tablet Take 1 tablet (10 mg total) by mouth daily. Patient not taking: Reported on 10/06/2021 12/21/20 10/07/21  Park Liter, MD    Scheduled Meds:  gabapentin  300 mg Oral BID   hydrALAZINE  10 mg Oral Q8H   insulin aspart  0-5 Units Subcutaneous QHS   insulin aspart  0-9 Units Subcutaneous TID WC   insulin aspart  4 Units Subcutaneous TID WC   insulin glargine-yfgn  16 Units Subcutaneous Daily   levothyroxine  75 mcg Oral Q0600   pantoprazole  40 mg Oral Daily   rivaroxaban  15 mg Oral q1800   saccharomyces boulardii  250 mg Oral BID   Continuous Infusions:  cefTRIAXone (ROCEPHIN)  IV 2 g (10/08/21 0936)   linezolid  (ZYVOX) IV 600 mg (10/08/21 2117)   PRN Meds:.acetaminophen, HYDROmorphone (DILAUDID) injection, melatonin, ondansetron (ZOFRAN) IV, oxyCODONE, polyethylene glycol  Allergies as of 10/06/2021 - Review Complete 10/06/2021  Allergen Reaction Noted   Quinapril Shortness Of Breath, Swelling, and Other (See Comments) 11/08/2020   Methaqualone Other (See Comments) 03/28/2020   Tape Rash and Other (See Comments) 10/06/2021    Family History  Problem Relation Age of Onset   Heart attack Father    Hyperlipidemia Father    Stroke Father     Social History   Socioeconomic History   Marital status: Married    Spouse name: Not on file   Number of children: Not on file   Years of education: Not on file   Highest education level: Not on file  Occupational History   Not on file  Tobacco Use   Smoking status: Never   Smokeless tobacco: Current  Vaping Use   Vaping Use: Never used  Substance and  Sexual Activity   Alcohol use: No   Drug use: No   Sexual activity: Not on file  Other Topics Concern   Not on file  Social History Narrative   Likes bird/deer watching, grand children   Social Determinants of Health   Financial Resource Strain: Low Risk    Difficulty of Paying Living Expenses: Not hard at all  Food Insecurity: No Food Insecurity   Worried About Charity fundraiser in the Last Year: Never true   [place] in the Last Year: Never true  Transportation Needs: No Transportation Needs   Lack of Transportation (Medical): No   Lack of Transportation (Non-Medical): No  Physical Activity: Not on file  Stress: No Stress Concern Present   Feeling of Stress : Only a little  Social Connections: Not on file  Intimate Partner Violence: Not At Risk   Fear of Current or Ex-Partner: No   Emotionally Abused: No   Physically Abused: No   Sexually Abused: No    Review of Systems:  Review of Systems  Constitutional:  Positive for weight loss (17 pounds in 3 months). Negative  for chills and fever.  HENT:  Negative for sore throat.   Respiratory:  Positive for shortness of breath (with exertion (walking)). Negative for hemoptysis.   Cardiovascular:  Negative for chest pain.  Gastrointestinal:  Positive for constipation (longstanding, last BM Thursday). Negative for abdominal pain, blood in stool, diarrhea, heartburn, melena, nausea and vomiting.  Musculoskeletal:  Positive for falls (5 in the last week).  Neurological:  Positive for weakness. Negative for seizures and loss of consciousness.   Physical Exam: Vital signs: Vitals:   10/08/21 2043 10/09/21 0531  BP: 134/64 136/65  Pulse: (!) 55 (!) 44  Resp: 16   Temp: 98.2 F (36.8 C) 98 F (36.7 C)  SpO2: 96% 97%     General:   Alert, in NAD Heart:  Regular rhythm, bradycardia, no murmurs Pulm: Clear anteriorly; no wheezing Abdomen:  Soft, not distended, Normal bowel sounds. No tenderness. Without guarding and Without rebound, without organomegaly. Extremities:  Minimal edema. Neurologic:  Alert and  oriented x4;  grossly normal neurologically. Psych:  Alert and cooperative. Normal mood and affect.  GI:  Lab Results: Recent Labs    10/06/21 1653 10/07/21 0446 10/08/21 0436  WBC 4.6 3.6* 3.6*  HGB 8.4* 8.1* 7.7*  HCT 25.6* 24.3* 23.5*  PLT 186 196 191   BMET Recent Labs    10/06/21 1216 10/07/21 0446 10/08/21 0436  NA 129* 130* 132*  K 4.0 4.0 4.1  CL 94* 101 103  CO2 25 22 24   GLUCOSE 169* 312* 254*  BUN 27* 26* 25*  CREATININE 2.07* 1.92* 1.94*  CALCIUM 8.6* 7.9* 7.9*   LFT Recent Labs    10/07/21 0446  PROT 6.2*  ALBUMIN 2.7*  AST 20  ALT 18  ALKPHOS 64  BILITOT 0.9   PT/INR Recent Labs    10/06/21 1216  LABPROT 24.7*  INR 2.2*    Studies/Results: No results found.  Impression Iron deficiency anemia  Labs 10/08/21: Hgb 7.7 Hct 23.5 MCV 92.5 BUN 25 Cr 1.94 Platelets 191  Iron panel 10/07/21: Iron 29 Ferritin 73  Hgb trending down since 10/06/21 - 10.1 on  admission, now 7.7  04/30/21 - H[facility] 9.5 at [facility]  Patient had 2 iron infusions in July with PCP  Fatigue/generalized weakness Progressive since hospitalization December 2021 and worse over the last 2 weeks  Multiple falls  Patient has had 5 falls this week.  No syncopal episodes reported.  CT 10/06/21 - No acute intracranial abnormality.  Unintentional weight loss 17 pounds over the last 3 months per patient's wife  Hypovolemic hyponatremia with AKI  Type 2 diabetes on insulin pump with hyperglycemia  Paroxysmal A. Fib on Xarelto  10/06/21: PT 24.7 INR 2.2  Last dose 11/21 at 17:07. Patient has been taking since 2014.  Cardiology consulted and will follow outpatient.   Hypothyroidism 10/07/21: TSH 5  9. GERD  On Omeprazole 40mg , no symptoms currently.   Patient has never had EGD.  10. Bradycardia  Patient seen by cardiology  HR 44 while in the room with patient, no CP, has baseline SOB.   Plan Recommended EGD/colon during this hospitalization to evaluate possible GI bleed. Patient refused colonoscopy at this time as he states he is too weak to do the prep and prefers to be able to do the prep in the comforts of his house but is willing to do it outpatient.  He is agreeable to the EGD.  Will try to get him on the schedule for tomorrow morning but will need to monitor his bradycardia closely, cardiology has seen the patient and signed off with follow up outpatient.    Last dose Xarelto 11/21. Will need to hold prior to procedure.   Continue to monitor Hgb, transfuse below 7.   Hemoccult testing has been ordered this morning. Will monitor results.     LOS: 3 days   Vladimir Crofts  PA-C 10/09/2021, 8:11 AM  Contact #  [phone]

## 2021-10-09 NOTE — Consult Note (Signed)
Referring Provider: [facility] Primary Care Physician:  [name], [name], MD Primary Gastroenterologist:  Althia Forts  Reason for Consultation:  Chronic iron deficiency anemia and fatigue  HPI: Jeremiah Beasley. is a 75 y.o. male with medical history significant for paroxysmal atrial fibrillation on Xarelto, transient bradycardia followed by cardiology, type 2 diabetes on insulin pump, diabetic polyneuropathy, GERD, essential hypertension, hyperlipidemia, hypothyroidism, intolerance to ACE inhibitor due to tongue swelling, who presented [facility], brought in by his wife and son due to generalized weakness and multiple falls, 5 times this week, including 1 fall prior to presenting to [facility] ED.  Associated with poor oral intake, a wound on his right buttock present for 2 months, intermittent fevers at home with T-max 101.2 a few days prior to presentation.  Per his family  at bedside his weakness has rapidly progressed for the past 2 weeks to the point where he shakes when he stands up due to his generalized weakness.  Has had unintentional weight loss, roughly 20 pounds in the last 3 months due to lack of appetite and poor oral intake.    GI consulted to evaluate chronic iron deficiency anemia and fatigue. Patient resting comfortably in chair this morning, his wife is with him. Patient states he has only seen GI for a colonoscopy 15 years ago.  Last colonoscopy was normal, without polyps, 10 year repeat recommended. Patient has never had an upper endoscopy.  Patient states he has been having worsening fatigue and weakness since 10/2020 when he was hospitalized for a ruptured gallbladder and sepsis. Since then, he has been seen for iron infusions with his PCP [facility] in [place]) - had two infusions in July for iron def anemia. He was scheduled for a repeat colonoscopy in August 2022 but cancelled due to a MRSA infection.   Patient denies dysphagia, nausea, and vomiting, though he did have  one episode of vomiting yesterday after eating a cup of pudding. Denies hematemesis. Patient has a history of GERD, controlled on Omeprazole 40 mg daily. Denies heartburn or reflux. Denies abdominal pain, diarrhea, hematochezia, melena. Patient has long history of constipation. Usually has two bowel movements per week. Last BM was Thursday, 11/17.   Patient's wife states he has lost his appetite and has had about 17 pounds of weight loss in the last 3 months. Denies family history of GI malignancies.   Patient is on Xarelto for A. Fib. Last dose given 11/21 at 17:07.    Lab Results  Component Value Date   WBC 3.6 (L) 10/08/2021   HGB 7.7 (L) 10/08/2021   HCT 23.5 (L) 10/08/2021   MCV 92.5 10/08/2021   PLT 191 10/08/2021      Past Medical History:  Diagnosis Date   Atrial fibrillation (HCC)    Chronic pain of right knee 04/11/2020   Diabetes mellitus without complication ([place])    Essential hypertension 05/15/2015   Hyperlipidemia    Hypertension    MRSA (methicillin resistant staph aureus) culture positive    Paroxysmal atrial fibrillation ([place]) 05/15/2015   Pneumonia 10/20/2020   Pure hypercholesterolemia 05/15/2015   S/P right knee arthroscopy 04/25/2020   Type 2 diabetes mellitus without complication ([place]) 34/19/3790    Past Surgical History:  Procedure Laterality Date   gall bladder removed   10/20/2020   Iron infusion  05/18/2021   x2   NO PAST SURGERIES      Prior to Admission medications   Medication Sig Start Date End Date Taking? Authorizing Provider  amiodarone (PACERONE) 200 MG tablet Take 100 mg by mouth daily. 11/12/16  Yes [provider]  amLODipine (NORVASC) 5 MG tablet Take 5 mg by mouth at bedtime.   Yes [provider]  digoxin (LANOXIN) 0.125 MG tablet Take 0.125 mg by mouth every other day.   Yes [provider]  diphenhydrAMINE (BENADRYL) 25 mg capsule Take 25 mg by mouth at bedtime.   Yes [provider]   ezetimibe (ZETIA) 10 MG tablet Take 1 tablet (10 mg total) by mouth daily. 06/08/20 11/05/21 Yes Park Liter, MD  furosemide (LASIX) 20 MG tablet TAKE ONE (1) TABLET BY MOUTH EVERY DAY. Patient taking differently: Take 20 mg by mouth in the morning. 04/02/21  Yes Park Liter, MD  gabapentin (NEURONTIN) 300 MG capsule Take 1 capsule (300 mg total) by mouth 2 (two) times daily. 09/24/21  Yes Patel, Donika K, DO  hydrALAZINE (APRESOLINE) 100 MG tablet Take 100 mg by mouth 3 (three) times daily. 01/25/21  Yes [provider]  Insulin Human (INSULIN PUMP) SOLN Inject 1 each into the skin continuous.   Yes [provider]  levothyroxine (SYNTHROID, LEVOTHROID) 75 MCG tablet Take 75 mcg by mouth daily before breakfast. 05/22/16  Yes [provider]  Multiple Vitamins-Minerals (CENTRUM SILVER ADULT 50+ PO) Take 1 tablet by mouth daily with breakfast.   Yes [provider]  omeprazole (PRILOSEC) 40 MG capsule Take 40 mg by mouth daily before supper. 02/16/21  Yes [provider]  rivaroxaban (XARELTO) 20 MG TABS tablet Take 20 mg by mouth daily with supper. 10/08/16  Yes [provider]  simvastatin (ZOCOR) 80 MG tablet Take 80 mg by mouth daily. 03/12/19  Yes [provider]  amLODipine (NORVASC) 10 MG tablet Take 1 tablet (10 mg total) by mouth daily. Patient not taking: Reported on 10/06/2021 12/21/20 10/07/21  Park Liter, MD    Scheduled Meds:  gabapentin  300 mg Oral BID   hydrALAZINE  10 mg Oral Q8H   insulin aspart  0-5 Units Subcutaneous QHS   insulin aspart  0-9 Units Subcutaneous TID WC   insulin aspart  4 Units Subcutaneous TID WC   insulin glargine-yfgn  16 Units Subcutaneous Daily   levothyroxine  75 mcg Oral Q0600   pantoprazole  40 mg Oral Daily   rivaroxaban  15 mg Oral q1800   saccharomyces boulardii  250 mg Oral BID   Continuous Infusions:  cefTRIAXone (ROCEPHIN)  IV 2 g (10/08/21 0936)   linezolid  (ZYVOX) IV 600 mg (10/08/21 2117)   PRN Meds:.acetaminophen, HYDROmorphone (DILAUDID) injection, melatonin, ondansetron (ZOFRAN) IV, oxyCODONE, polyethylene glycol  Allergies as of 10/06/2021 - Review Complete 10/06/2021  Allergen Reaction Noted   Quinapril Shortness Of Breath, Swelling, and Other (See Comments) 11/08/2020   Methaqualone Other (See Comments) 03/28/2020   Tape Rash and Other (See Comments) 10/06/2021    Family History  Problem Relation Age of Onset   Heart attack Father    Hyperlipidemia Father    Stroke Father     Social History   Socioeconomic History   Marital status: Married    Spouse name: Not on file   Number of children: Not on file   Years of education: Not on file   Highest education level: Not on file  Occupational History   Not on file  Tobacco Use   Smoking status: Never   Smokeless tobacco: Current  Vaping Use   Vaping Use: Never used  Substance and  Sexual Activity   Alcohol use: No   Drug use: No   Sexual activity: Not on file  Other Topics Concern   Not on file  Social History Narrative   Likes bird/deer watching, grand children   Social Determinants of Health   Financial Resource Strain: Low Risk    Difficulty of Paying Living Expenses: Not hard at all  Food Insecurity: No Food Insecurity   Worried About Charity fundraiser in the Last Year: Never true   [place] in the Last Year: Never true  Transportation Needs: No Transportation Needs   Lack of Transportation (Medical): No   Lack of Transportation (Non-Medical): No  Physical Activity: Not on file  Stress: No Stress Concern Present   Feeling of Stress : Only a little  Social Connections: Not on file  Intimate Partner Violence: Not At Risk   Fear of Current or Ex-Partner: No   Emotionally Abused: No   Physically Abused: No   Sexually Abused: No    Review of Systems:  Review of Systems  Constitutional:  Positive for weight loss (17 pounds in 3 months). Negative  for chills and fever.  HENT:  Negative for sore throat.   Respiratory:  Positive for shortness of breath (with exertion (walking)). Negative for hemoptysis.   Cardiovascular:  Negative for chest pain.  Gastrointestinal:  Positive for constipation (longstanding, last BM Thursday). Negative for abdominal pain, blood in stool, diarrhea, heartburn, melena, nausea and vomiting.  Musculoskeletal:  Positive for falls (5 in the last week).  Neurological:  Positive for weakness. Negative for seizures and loss of consciousness.   Physical Exam: Vital signs: Vitals:   10/08/21 2043 10/09/21 0531  BP: 134/64 136/65  Pulse: (!) 55 (!) 44  Resp: 16   Temp: 98.2 F (36.8 C) 98 F (36.7 C)  SpO2: 96% 97%     General:   Alert, in NAD Heart:  Regular rhythm, bradycardia, no murmurs Pulm: Clear anteriorly; no wheezing Abdomen:  Soft, not distended, Normal bowel sounds. No tenderness. Without guarding and Without rebound, without organomegaly. Extremities:  Minimal edema. Neurologic:  Alert and  oriented x4;  grossly normal neurologically. Psych:  Alert and cooperative. Normal mood and affect.  GI:  Lab Results: Recent Labs    10/06/21 1653 10/07/21 0446 10/08/21 0436  WBC 4.6 3.6* 3.6*  HGB 8.4* 8.1* 7.7*  HCT 25.6* 24.3* 23.5*  PLT 186 196 191   BMET Recent Labs    10/06/21 1216 10/07/21 0446 10/08/21 0436  NA 129* 130* 132*  K 4.0 4.0 4.1  CL 94* 101 103  CO2 25 22 24   GLUCOSE 169* 312* 254*  BUN 27* 26* 25*  CREATININE 2.07* 1.92* 1.94*  CALCIUM 8.6* 7.9* 7.9*   LFT Recent Labs    10/07/21 0446  PROT 6.2*  ALBUMIN 2.7*  AST 20  ALT 18  ALKPHOS 64  BILITOT 0.9   PT/INR Recent Labs    10/06/21 1216  LABPROT 24.7*  INR 2.2*    Studies/Results: No results found.  Impression Iron deficiency anemia  Labs 10/08/21: Hgb 7.7 Hct 23.5 MCV 92.5 BUN 25 Cr 1.94 Platelets 191  Iron panel 10/07/21: Iron 29 Ferritin 73  Hgb trending down since 10/06/21 - 10.1 on  admission, now 7.7  04/30/21 - Hgb 9.5 at [facility]  Patient had 2 iron infusions in July with PCP  Fatigue/generalized weakness Progressive since hospitalization December 2021 and worse over the last 2 weeks  Multiple falls  Patient has had 5 falls this week.  No syncopal episodes reported.  CT 10/06/21 - No acute intracranial abnormality.  Unintentional weight loss 17 pounds over the last 3 months per patient's wife  Hypovolemic hyponatremia with AKI  Type 2 diabetes on insulin pump with hyperglycemia  Paroxysmal A. Fib on Xarelto  10/06/21: PT 24.7 INR 2.2  Last dose 11/21 at 17:07. Patient has been taking since 2014.  Cardiology consulted and will follow outpatient.   Hypothyroidism 10/07/21: TSH 5  9. GERD  On Omeprazole 40mg , no symptoms currently.   Patient has never had EGD.  10. Bradycardia  Patient seen by cardiology  HR 44 while in the room with patient, no CP, has baseline SOB.   Plan Recommended EGD/colon during this hospitalization to evaluate possible GI bleed. Patient refused colonoscopy at this time as he states he is too weak to do the prep and prefers to be able to do the prep in the comforts of his house but is willing to do it outpatient.  He is agreeable to the EGD.  Will try to get him on the schedule for tomorrow morning but will need to monitor his bradycardia closely, cardiology has seen the patient and signed off with follow up outpatient.    Last dose Xarelto 11/21. Will need to hold prior to procedure.   Continue to monitor Hgb, transfuse below 7.   Hemoccult testing has been ordered this morning. Will monitor results.     LOS: 3 days   Vladimir Crofts  PA-C 10/09/2021, 8:11 AM  Contact #  [phone]

## 2021-10-09 NOTE — Progress Notes (Signed)
Occupational Therapy Treatment Patient Details Name: Jeremiah Beasley. MRN: [id number] DOB: 1946/04/05 Today's Date: 10/09/2021   History of present illness Pt admitted from home with c/o weakness and falls and dx with hyponatremia.  Pt with hx of DM, p-af, diabetic polyneuropathy, and chronic R knee pain (scope 04/25/21).   OT comments  Patient was educated on positioning to reduce pressure on bottom. Patient and wife verbalized understanding. Patient reported improved comfort with implementation of positioning techniques. Patient was noted to have LOB with min A for maintaining standing balance with education to keep RW on the floor and to keep BUE on RW. Patient demonstrated understanding. Patients HR was noted to increase to 54 bpm with functional mobility. Patient's discharge plan remains appropriate at this time. OT will continue to follow acutely.     Recommendations for follow up therapy are one component of a multi-disciplinary discharge planning process, led by the attending physician.  Recommendations may be updated based on patient status, additional functional criteria and insurance authorization.    Follow Up Recommendations  Home health OT    Assistance Recommended at Discharge Frequent or constant Supervision/Assistance  Equipment Recommendations  None recommended by OT    Recommendations for Other Services      Precautions / Restrictions Precautions Precautions: Fall Precaution Comments: multiple falls recently Restrictions Weight Bearing Restrictions: No       Mobility Bed Mobility               General bed mobility comments: oob in recliner    Transfers Overall transfer level: Needs assistance Equipment used: Rolling walker (2 wheels) Transfers: Sit to/from Stand Sit to Stand: Min guard           General transfer comment: for safety. cues to steady himself once standing and before taking any steps     Balance Overall balance assessment: Needs  assistance Sitting-balance support: Feet supported;No upper extremity supported Sitting balance-Leahy Scale: Good     Standing balance support: Bilateral upper extremity supported;Reliant on assistive device for balance Standing balance-Leahy Scale: Poor Standing balance comment: reliant on walker with ambulaton                           ADL either performed or assessed with clinical judgement   ADL Overall ADL's : Needs assistance/impaired                                       General ADL Comments: patient declined to particiapte in ADLs stating he already completed them this AM. patient was educated on positiong for in bed and in recliner to reduce pressure on pressure injury on bottom. patient reported increased comfort with postioning tips when implemented. patient was able to particiapte in functional mobiltiy with HR increasing to 54bpm with activity from 44 bpm at rest. patient was noted to have one LOB with min A to maintain standing balance with education to keep RW on floor at all times. patient verbalized understanding with wife verbalizing the same. patient was educated on importance of taking it slow at home and [facility] services. patient verbalized understanding.    Extremity/Trunk Assessment              Vision Patient Visual Report: No change from baseline     Perception     Praxis      Cognition Arousal/Alertness: Awake/alert Behavior During  Therapy: WFL for tasks assessed/performed Overall Cognitive Status: Within Functional Limits for tasks assessed                                 General Comments: wife was present during session as well.          Exercises     Shoulder Instructions       General Comments      Pertinent Vitals/ Pain       Pain Assessment: Faces Faces Pain Scale: Hurts little more Pain Location: buttock wound Pain Descriptors / Indicators: Discomfort;Sore Pain Intervention(s): Monitored  during session  Home Living                                          Prior Functioning/Environment              Frequency  Min 2X/week        Progress Toward Goals  OT Goals(current goals can now be found in the care plan section)  Progress towards OT goals: Progressing toward goals     Plan Discharge plan remains appropriate    Co-evaluation                 AM-PAC OT "6 Clicks" Daily Activity     Outcome Measure   Help from another person eating meals?: None Help from another person taking care of personal grooming?: A Little Help from another person toileting, which includes using toliet, bedpan, or urinal?: A Little Help from another person bathing (including washing, rinsing, drying)?: A Little Help from another person to put on and taking off regular upper body clothing?: A Little Help from another person to put on and taking off regular lower body clothing?: A Lot 6 Click Score: 18    End of Session Equipment Utilized During Treatment: Rolling walker (2 wheels)  OT Visit Diagnosis: Unsteadiness on feet (R26.81)   Activity Tolerance Patient tolerated treatment well   Patient Left in chair;with call bell/phone within reach;with chair alarm set;with family/visitor present   Nurse Communication Mobility status        Time: 8416-6063 OT Time Calculation (min): 21 min  Charges:    Jackelyn Poling OTR/L, [place] [facility] Office# [phone] Pager# [phone]   Marcellina Millin 10/09/2021, 1:22 PM

## 2021-10-10 ENCOUNTER — Inpatient Hospital Stay (HOSPITAL_COMMUNITY): Payer: Medicare Other | Admitting: Anesthesiology

## 2021-10-10 ENCOUNTER — Inpatient Hospital Stay (HOSPITAL_COMMUNITY): Payer: Medicare Other

## 2021-10-10 ENCOUNTER — Encounter (HOSPITAL_COMMUNITY): Payer: Self-pay | Admitting: Internal Medicine

## 2021-10-10 ENCOUNTER — Encounter (HOSPITAL_COMMUNITY)
Admission: EM | Disposition: A | Discharge status: Home Health | Payer: Self-pay | Source: Home / Self Care | Attending: Internal Medicine

## 2021-10-10 DIAGNOSIS — R0609 Other forms of dyspnea: Secondary | ICD-10-CM

## 2021-10-10 DIAGNOSIS — R001 Bradycardia, unspecified: Secondary | ICD-10-CM

## 2021-10-10 HISTORY — PX: BIOPSY: SHX5522

## 2021-10-10 HISTORY — PX: ESOPHAGOGASTRODUODENOSCOPY (EGD) WITH PROPOFOL: SHX5813

## 2021-10-10 LAB — GLUCOSE, CAPILLARY
Glucose-Capillary: 146 mg/dL — ABNORMAL HIGH (ref 70–99)
Glucose-Capillary: 173 mg/dL — ABNORMAL HIGH (ref 70–99)
Glucose-Capillary: 172 mg/dL — ABNORMAL HIGH (ref 70–99)
Glucose-Capillary: 79 mg/dL (ref 70–99)
Glucose-Capillary: 160 mg/dL — ABNORMAL HIGH (ref 70–99)

## 2021-10-10 LAB — ECHOCARDIOGRAM COMPLETE
Area-P 1/2: 3.34 cm2
Calc EF: 48.3 %
Height: 69 in
S' Lateral: 3.8 cm
Single Plane A2C EF: 49.1 %
Single Plane A4C EF: 50.7 %
Weight: 2921.6 oz

## 2021-10-10 LAB — BASIC METABOLIC PANEL
Anion gap: 8 (ref 5–15)
BUN: 16 mg/dL (ref 8–23)
CO2: 23 mmol/L (ref 22–32)
Calcium: 8.6 mg/dL — ABNORMAL LOW (ref 8.9–10.3)
Chloride: 105 mmol/L (ref 98–111)
Creatinine, Ser: 1.48 mg/dL — ABNORMAL HIGH (ref 0.61–1.24)
GFR, Estimated: 50 mL/min — ABNORMAL LOW (ref >60)
Glucose, Bld: 149 mg/dL — ABNORMAL HIGH (ref 70–99)
Potassium: 3.8 mmol/L (ref 3.5–5.1)
Sodium: 136 mmol/L (ref 135–145)

## 2021-10-10 LAB — CBC
HCT: 27.6 % — ABNORMAL LOW (ref 39.0–52.0)
Hemoglobin: 9.1 g/dL — ABNORMAL LOW (ref 13.0–17.0)
MCH: 30.1 pg (ref 26.0–34.0)
MCHC: 33 g/dL (ref 30.0–36.0)
MCV: 91.4 fL (ref 80.0–100.0)
Platelets: 214 10*3/uL (ref 150–400)
RBC: 3.02 MIL/uL — ABNORMAL LOW (ref 4.22–5.81)
RDW: 13.2 % (ref 11.5–15.5)
WBC: 3.6 10*3/uL — ABNORMAL LOW (ref 4.0–10.5)
nRBC: 0 % (ref 0.0–0.2)

## 2021-10-10 LAB — MAGNESIUM: Magnesium: 2 mg/dL (ref 1.7–2.4)

## 2021-10-10 SURGERY — ESOPHAGOGASTRODUODENOSCOPY (EGD) WITH PROPOFOL
Anesthesia: Monitor Anesthesia Care

## 2021-10-10 MED ORDER — COLLAGENASE 250 UNIT/GM EX OINT
TOPICAL_OINTMENT | Freq: Every day | CUTANEOUS | Status: DC
  Filled 2021-10-10: qty 30

## 2021-10-10 MED ORDER — SODIUM CHLORIDE 0.9 % IV SOLN: INTRAVENOUS | Status: DC

## 2021-10-10 MED ORDER — MINOCYCLINE HCL 100 MG PO CAPS: 100.0000 mg | ORAL_CAPSULE | Freq: Two times a day (BID) | ORAL | 0 refills | Status: AC

## 2021-10-10 MED ORDER — ACETAMINOPHEN 325 MG PO TABS: 650.0000 mg | ORAL_TABLET | Freq: Four times a day (QID) | ORAL | Status: AC | PRN

## 2021-10-10 MED ORDER — DOXYCYCLINE HYCLATE 100 MG PO TABS: 100.0000 mg | ORAL_TABLET | Freq: Two times a day (BID) | ORAL | Status: DC

## 2021-10-10 MED ORDER — PROPOFOL 500 MG/50ML IV EMUL
INTRAVENOUS | Status: DC | PRN
  Administered 2021-10-10: 150 ug/kg/min via INTRAVENOUS

## 2021-10-10 MED ORDER — AMOXICILLIN-POT CLAVULANATE 875-125 MG PO TABS: 1.0000 | ORAL_TABLET | Freq: Two times a day (BID) | ORAL | 0 refills | Status: AC

## 2021-10-10 SURGICAL SUPPLY — 15 items

## 2021-10-10 NOTE — Op Note (Signed)
[facility] Patient Name: Jeremiah Beasley Procedure Date: 10/10/2021 MRN: [id number] Attending MD: Otis Brace , MD Date of Birth: Jul 11, 1946 CSN: [id number] Age: 75 Admit Type: Inpatient Procedure:                Upper GI endoscopy Indications:              Iron deficiency anemia Providers:                Otis Brace, MD, Dulcy Fanny, Frazier Richards, Technician Referring MD:              Medicines:                Sedation Administered by an Anesthesia Professional Complications:            No immediate complications. Estimated Blood Loss:     Estimated blood loss was minimal. Procedure:                Pre-Anesthesia Assessment:                           - Prior to the procedure, a History and Physical                            was performed, and patient medications and                            allergies were reviewed. The patient's tolerance of                            previous anesthesia was also reviewed. The risks                            and benefits of the procedure and the sedation                            options and risks were discussed with the patient.                            All questions were answered, and informed consent                            was obtained. Prior Anticoagulants: The patient has                            taken no previous anticoagulant or antiplatelet                            agents. ASA Grade Assessment: II - A patient with                            mild systemic disease. After reviewing the risks  and benefits, the patient was deemed in                            satisfactory condition to undergo the procedure.                           After obtaining informed consent, the endoscope was                            passed under direct vision. Throughout the                            procedure, the patient's blood pressure, pulse, and                             oxygen saturations were monitored continuously. The                            [id number]-[id number] ([id number]) Olympus endoscope was introduced                            through the mouth, and advanced to the second part                            of duodenum. The upper GI endoscopy was                            accomplished without difficulty. The patient                            tolerated the procedure well. Scope In: Scope Out: Findings:      The Z-line was regular and was found 40 cm from the incisors.      Diffuse moderate inflammation characterized by congestion (edema) and       erythema was found in the entire examined stomach. Biopsies were taken       with a cold forceps for histology.      The cardia and gastric fundus were normal on retroflexion.      The duodenal bulb, first portion of the duodenum and second portion of       the duodenum were normal. Impression:               - Z-line regular, 40 cm from the incisors.                           - Chronic gastritis. Biopsied.                           - Normal duodenal bulb, first portion of the                            duodenum and second portion of the duodenum. Moderate Sedation:      Moderate (conscious) sedation was personally administered by an       anesthesia professional. The following parameters were monitored: oxygen  saturation, heart rate, blood pressure, and response to care. Recommendation:           - Return patient to hospital ward for ongoing care.                           - Soft diet.                           - Continue present medications.                           - Await pathology results.                           - Return to my office in 2 months. Procedure Code(s):        --- Professional ---                           [phone], Esophagogastroduodenoscopy, flexible,                            transoral; with biopsy, single or multiple Diagnosis Code(s):        --- Professional ---                            K29.50, Unspecified chronic gastritis without                            bleeding                           D50.9, Iron deficiency anemia, unspecified CPT copyright 2019 American Medical Association. All rights reserved. The codes documented in this report are preliminary and upon coder review may  be revised to meet current compliance requirements. Otis Brace, MD Otis Brace, MD 10/10/2021 9:36:56 AM Number of Addenda: 0

## 2021-10-10 NOTE — Progress Notes (Addendum)
Inpatient Diabetes Program Recommendations  AACE/ADA: New Consensus Statement on Inpatient Glycemic Control (2015)  Target Ranges:  Prepandial:   less than 140 mg/dL      Peak postprandial:   less than 180 mg/dL (1-2 hours)      Critically ill patients:  140 - 180 mg/dL    Latest Reference Range & Units 10/10/21 03:01 10/10/21 07:29 10/10/21 08:18 10/10/21 11:20  Glucose-Capillary 70 - 99 mg/dL 146 (H) 173 (H) 172 (H) 79    Home DM Meds: Insulin Pump   Current Orders: Semglee 16 units Daily                            Novolog Sensitive Correction Scale/ SSI (0-9 units) TID AC + HS                            Novolog 4 units TID with meals    Called by Cruzita Lederer about this pt.  Per RN, Dr. Wynelle Cleveland would like for pt to resume his home insulin pump in preparation for discharge home.  Pt did NOT get Semglee this AM so it is OK for pt to resume his home insulin pump this AM.  Met w/ RN Magda Paganini and Dr. Wynelle Cleveland on nursing unit and Dr. Wynelle Cleveland gave the go ahead to have pt restart insulin pump.  Met w/ pt and wife at bedside.  Wife replaced battery in insulin pump and we were able to set the correct time and date on the pump.  Pt was able to refill pump reservoir, prime tubing, and place new insertion site independently with wife watching and assisting sightly with all new supplies from home.  Insertion site placed by pt on right lower abdomen and pump restarted by pt at 12:15pm.  Reviewed insulin pump settings with pt--they are the exact same settings as those listed below.  Pump appears to be up and running without issues.    ENDO: [facility] Last seen 08/09/2021 Insulin Pump Settings were as follows: Insulin: Lispro (Humalog) Pump Brand/Model: Medtronic  Basal Total Daily Basal Insulin: 32.1  Basal Time: 00:00 (1) Basal Rate (Units/Hr): 1.4  Basal Time: 04:00 (2) Basal Rate (Units/Hr): 1.5  Basal Time: 07:00 (3) Basal Rate (Units/Hr): 1.4  Basal Time: 18:00 (4) Basal Rate  (Units/Hr): 1.1   Insulin:Carb ICR Time: 00:00 (1) ICR : 9.5 ICR Time: 06:00 (2) ICR : 7 ICR Time: 11:00 (3) ICR: 8  ICR Time: 18:00 (4) ICR: 7.5  ICR Time: 21:00 (5) ICR: 8.5   Sensitivity ISF Time: 00:00 (1) ISF: 20  Target BG Time: 00:00 (1) Target BG (mg/dL): 140  BG Time: 07:30 (2) Target BG (mg/dL): 130  BG Time: 22:00 (3) Target BG (mg/dL): 140    --Will follow patient during hospitalization--   Wyn Quaker RN, MSN, CDE Diabetes Coordinator Inpatient Glycemic Control Team Team Pager: [phone] (8a-5p)

## 2021-10-10 NOTE — Progress Notes (Signed)
Inpatient Diabetes Program Recommendations  AACE/ADA: New Consensus Statement on Inpatient Glycemic Control (2015)  Target Ranges:  Prepandial:   less than 140 mg/dL      Peak postprandial:   less than 180 mg/dL (1-2 hours)      Critically ill patients:  140 - 180 mg/dL    Latest Reference Range & Units 10/09/21 07:51 10/09/21 11:12 10/09/21 16:27 10/09/21 20:12 10/09/21 20:29 10/09/21 21:28  Glucose-Capillary 70 - 99 mg/dL 142 (H)  5 units Novolog  16 units Semglee @0844   283 (H)  9 units Novolog  127 (H)  5 units Novolog  42 (LL) 56 (L) 121 (H)    Home DM Meds: Insulin Pump   Current Orders: Semglee 16 units Daily                            Novolog Sensitive Correction Scale/ SSI (0-9 units) TID AC + HS                            Novolog 4 units TID with meals    MD- Note Hypoglycemia yesterday at 8pm.  Please consider reducing the Novolog Meal Coverage to 2 units TID with meals   ENDO: [facility] Last seen 08/09/2021 Insulin Pump Settings were as follows: Insulin: Lispro (Humalog) Pump Brand/Model: Medtronic  Basal Total Daily Basal Insulin: 32.1  Basal Time: 00:00 (1) Basal Rate (Units/Hr): 1.4  Basal Time: 04:00 (2) Basal Rate (Units/Hr): 1.5  Basal Time: 07:00 (3) Basal Rate (Units/Hr): 1.4  Basal Time: 18:00 (4) Basal Rate (Units/Hr): 1.1   Insulin:Carb ICR Time: 00:00 (1) ICR : 9.5 ICR Time: 06:00 (2) ICR : 7 ICR Time: 11:00 (3) ICR: 8  ICR Time: 18:00 (4) ICR: 7.5  ICR Time: 21:00 (5) ICR: 8.5   Sensitivity ISF Time: 00:00 (1) ISF: 20  Target BG Time: 00:00 (1) Target BG (mg/dL): 140  BG Time: 07:30 (2) Target BG (mg/dL): 130  BG Time: 22:00 (3) Target BG (mg/dL): 140    --Will follow patient during hospitalization--   Wyn Quaker RN, MSN, CDE Diabetes Coordinator Inpatient Glycemic Control Team Team Pager: [phone] (8a-5p)

## 2021-10-10 NOTE — Brief Op Note (Addendum)
10/06/2021 - 10/10/2021  9:37 AM  PATIENT:  Jeremiah Beasley.  75 y.o. male  PRE-OPERATIVE DIAGNOSIS:  iron deficiency anemia, weakness, fatigue  POST-OPERATIVE DIAGNOSIS:  Gastritis  PROCEDURE:  Procedure(s): ESOPHAGOGASTRODUODENOSCOPY (EGD) WITH PROPOFOL (N/A) BIOPSY  SURGEON:  Surgeon(s) and Role:    * Jerriyah Louis, MD - Primary  Findings ---------- -EGD showed moderate chronic gastritis.  Biopsies taken.  No evidence of active bleeding.  Recommendations ----------------------- -Patient continues to decline colonoscopy -Start soft diet and advance as tolerated -Protonix 40 mg once a day -Recommend follow-up in GI office in 2 months to discuss outpatient colonoscopy -Okay to resume anticoagulation from GI standpoint -No further inpatient GI work-up planned.  GI will sign off.  Call us back if needed.  Otis Brace MD, [place] 10/10/2021, 9:38 AM  Contact #  [phone]

## 2021-10-10 NOTE — Anesthesia Postprocedure Evaluation (Signed)
Anesthesia Post Note  Patient: Jeremiah Beasley.  Procedure(s) Performed: ESOPHAGOGASTRODUODENOSCOPY (EGD) WITH PROPOFOL BIOPSY     Patient location during evaluation: PACU Anesthesia Type: MAC Level of consciousness: awake and alert Pain management: pain level controlled Vital Signs Assessment: post-procedure vital signs reviewed and stable Respiratory status: spontaneous breathing, nonlabored ventilation and respiratory function stable Cardiovascular status: stable and blood pressure returned to baseline Anesthetic complications: no   No notable events documented.  Last Vitals:  Vitals:   10/10/21 0940 10/10/21 0950  BP: (!) 134/58 (!) 164/55  Pulse: (!) 42 (!) 42  Resp: 13 17  Temp:    SpO2: 95% 94%    Last Pain:  Vitals:   10/10/21 0950  TempSrc:   PainSc: 0-No pain                 Audry Pili

## 2021-10-10 NOTE — Discharge Summary (Addendum)
Physician Discharge Summary   Patient name: Jeremiah Beasley.  Admit date:     10/06/2021  Discharge date: 10/10/2021  Discharge Physician: Debbe Odea   PCP: [name], MD   Recommendations at discharge: F/u on right buttock wound and encourage patient to avoid direct pressure on this area, in order to allow healing F/u on Bmet in 1 week to assess sodium and renal function Lasix, Digoxin, Amiodarone and Amlodipine on hold  Discharge Diagnoses Principal Problem:   Hyponatremia Active Problems:   Generalized weakness   Bradycardia   Essential (primary) hypertension   Paroxysmal atrial fibrillation (HCC)   Hyperlipidemia   Benign hypertension with chronic kidney disease, stage III [facility])    Hospital Course   This is a 75 year old male with paroxysmal atrial fibrillation on Xarelto, type 2 diabetes on insulin pump, diabetic polyneuropathy, GERD, essential hypertension, hyperlipidemia, hypothyroidism, intolerance to ACE inhibitor due to tongue swelling, who presented to [facility] ED, brought in by his wife and son due to generalized weakness and multiple falls.  The patient admitted to having at least 5 falls this prior week.  Also noted that he was having temperatures up to 101 a few days ago.  Per family, the patient was so weak that he would shake when he tried to stand up and also had about a 24 pound weight loss in the past 3 months due to lack of appetite and poor oral intake. The ED, sodium was noted to be 126.  He was started on normal saline and Lasix was held.  Due to bradycardia, both Digoxin metoprolol and amiodarone were held by cardiology and it has been recommended to continue to hold these  Acute generalized weakness possibly secondary to hyponatremia, poor oral intake & possibly bradycardia AKI on CKD 3 Lactic acidosis -Multifactorial-possibly related to poor oral intake, Lasix, bradycardia and infected right buttock wound -Sodium was 126, creatinine 1.96 (rising  to 2.07) and lactic acid 2.7 - Patient sodium creatinine and lactic acid steadily improved after holding Lasix and starting IV fluids - Cr now 1.48 - Has been assessed by physical therapy he feels that he would be safe to go home with home health  Infected full thickness right buttock wound - Wound has been present for many weeks now-treated in the hospital with multiple broad-spectrum antibiotics and also evaluated by general surgery-no abscess was noted and therefore no intervention was done - Wound care has evaluated the patient and has made recommendations: Wound care to right buttock wound, full thickness:  Cleanse with soap and water, rinse and pat dry. Cover with xeroform gauze Kellie Simmering # [id number]), top with dry gauze and cover with silicone foam dressing. Change xeroform daily, may reuse silicone foam for up to 3 days. Change PRN soiling. Position patient off of lesion. - I have clearly explained to both the patient and the wife that the patient should not be laying directly on the wound as the wound will have significant difficulty in healing-I have helped the patient turn to relieve pressure from the wound and advised that they continue to do the same at home -Have transitioned him to doxycycline and Augmentin to continue for 5 more days  Bradycardia-paroxysmal atrial fibrillation - Have discontinued digoxin and amiodarone per cardiology recommendations-no further interventions needed per cardiology -Continue Xarelto  Iron deficiency anemia - GI was consulted and an EGD and colonoscopy recommended however, the patient declined a colonoscopy - The patient underwent an EGD today and is found to have moderate chronic  gastritis - Per GI, resume Xarelto, and continue Protonix 40 mg daily  Type 2 diabetes mellitus on insulin pump - Hemoglobin A1c is 6.0 reveals good control  HTN - currently being discharged on Hydralazine only  Procedures performed: EGD   Condition at discharge:  stable  Exam Physical Exam General exam: Appears comfortable  HEENT: PERRLA, oral mucosa moist, no sclera icterus or thrush Respiratory system: Clear to auscultation. Respiratory effort normal. Cardiovascular system: S1 & S2 heard, regular rate and rhythm Gastrointestinal system: Abdomen soft, non-tender, nondistended. Normal bowel sounds   Central nervous system: Alert and oriented. No focal neurological deficits. Extremities: No cyanosis, clubbing or edema Skin: No rashes or ulcers Psychiatry:  Mood & affect appropriate.    Disposition: Home  Discharge time: greater than 30 minutes.  Follow-up Information     Park Liter, MD Follow up on 11/09/2021.   Specialty: Cardiology Why: @11am  Contact information: [place] [address] [address] Follow up.   Why: [place] nursing/physical therapy/occupational therapy Contact information: [address] [place] [address] [phone]         [name], MD Follow up.   Specialty: Internal Medicine Why: Please check Bmet in 1 wk on follow up in the office. Contact information: [address] [place] [address] [phone]         Park Liter, MD .   Specialty: Cardiology Contact information: [place] [address] [phone]                 Allergies as of 10/10/2021       Reactions   Quinapril Shortness Of Breath, Swelling, Other (See Comments)   TONGUE SWELLLING   Methaqualone Other (See Comments)   Unknown to patient   Tape Rash, Other (See Comments)   Skin is sensitive!!        Medication List     STOP taking these medications    amiodarone 200 MG tablet Commonly known as: PACERONE   amLODipine 10 MG tablet Commonly known as: NORVASC   amLODipine 5 MG tablet Commonly known as: NORVASC   digoxin 0.125 MG tablet Commonly known as: LANOXIN   furosemide 20 MG tablet Commonly known as: LASIX       TAKE  these medications    acetaminophen 325 MG tablet Commonly known as: TYLENOL Take 2 tablets (650 mg total) by mouth every 6 (six) hours as needed for mild pain, fever or headache.   amoxicillin-clavulanate 875-125 MG tablet Commonly known as: Augmentin Take 1 tablet by mouth every 12 (twelve) hours for 10 doses.   CENTRUM SILVER ADULT 50+ PO Take 1 tablet by mouth daily with breakfast.   diphenhydrAMINE 25 mg capsule Commonly known as: BENADRYL Take 25 mg by mouth at bedtime.   ezetimibe 10 MG tablet Commonly known as: ZETIA Take 1 tablet (10 mg total) by mouth daily.   gabapentin 300 MG capsule Commonly known as: NEURONTIN Take 1 capsule (300 mg total) by mouth 2 (two) times daily.   hydrALAZINE 100 MG tablet Commonly known as: APRESOLINE Take 100 mg by mouth 3 (three) times daily.   insulin pump Soln Inject 1 each into the skin continuous.   levothyroxine 75 MCG tablet Commonly known as: SYNTHROID Take 75 mcg by mouth daily before breakfast.   minocycline 100 MG capsule Commonly known as: MINOCIN Take 1 capsule (100 mg total) by mouth 2 (two)  times daily for 5 days.   omeprazole 40 MG capsule Commonly known as: PRILOSEC Take 40 mg by mouth daily before supper.   rivaroxaban 20 MG Tabs tablet Commonly known as: XARELTO Take 20 mg by mouth daily with supper.   simvastatin 80 MG tablet Commonly known as: ZOCOR Take 80 mg by mouth daily.        CT Head Wo Contrast  Result Date: 10/06/2021 CLINICAL DATA:  75 year old male with weakness and multiple falls. EXAM: CT HEAD WITHOUT CONTRAST TECHNIQUE: Contiguous axial images were obtained from the base of the skull through the vertex without intravenous contrast. COMPARISON:  Brain MRI 06/25/2006.  Head CT 04/26/2021. FINDINGS: Brain: Stable cerebral volume since June. No midline shift, ventriculomegaly, mass effect, evidence of mass lesion, intracranial hemorrhage or evidence of cortically based acute infarction.  Gray-white matter differentiation is stable and largely normal for age. There is minimal to mild periventricular white matter hypodensity. Vascular: Calcified atherosclerosis at the skull base. No suspicious intracranial vascular hyperdensity. Skull: Stable, intact. Sinuses/Orbits: Visualized paranasal sinuses and mastoids are stable and well aerated. Other: Visualized orbits and scalp soft tissues are within normal limits. IMPRESSION: No acute intracranial abnormality. Stable mild for age cerebral white matter changes. Electronically Signed   By: Genevie Ann M.D.   On: 10/06/2021 11:37   US PELVIS LIMITED (TRANSABDOMINAL ONLY)  Result Date: 10/09/2021 CLINICAL DATA:  Right buttock wound for 6-8 weeks EXAM: LIMITED ULTRASOUND OF PELVIS TECHNIQUE: Limited transabdominal ultrasound examination of the pelvis was performed. COMPARISON:  08/20/2021 FINDINGS: Sonographic evaluation of the soft tissue wound within the right buttock was performed. There is diffuse subcutaneous edema and increased vascularity, suspicious for cellulitis. No fluid collection or abscess. IMPRESSION: 1. Hyperemia and subcutaneous edema at the site of right buttock wound, compatible with cellulitis. No evidence of abscess. Electronically Signed   By: Randa Ngo M.D.   On: 10/09/2021 19:33   DG Chest Port 1 View  Result Date: 10/06/2021 CLINICAL DATA:  75 year old male with possible sepsis. EXAM: PORTABLE CHEST 1 VIEW COMPARISON:  CT Chest, Abdomen, and Pelvis 11/20/2020 and earlier. FINDINGS: Portable AP upright view at 1056 hours. Mildly improved lung volumes. Mediastinal contours are stable. Borderline to mild cardiomegaly. Allowing for portable technique the lungs are clear. Visualized tracheal air column is within normal limits. No pneumothorax or pleural effusion. Negative visible bowel gas. No acute osseous abnormality identified. IMPRESSION: No acute cardiopulmonary abnormality. Electronically Signed   By: Genevie Ann M.D.   On:  10/06/2021 11:38   Results for orders placed or performed during the hospital encounter of 10/06/21  Blood culture (routine x 2)     Status: None (Preliminary result)   Collection Time: 10/06/21  9:50 AM   Specimen: BLOOD RIGHT ARM  Result Value Ref Range Status   Specimen Description   Final    BLOOD RIGHT ARM BLOOD Performed at [facility], [address] [place], [place] [address]    Special Requests   Final    Blood Culture adequate volume BOTTLES DRAWN AEROBIC AND ANAEROBIC Performed at [facility], [address] [place], [place] [address]    Culture   Final    NO GROWTH 4 DAYS Performed at [facility], [place] [address], [place], [place] [address]    Report Status PENDING  Incomplete  Resp Panel by RT-PCR (Flu A&B, Covid) Nasopharyngeal Swab     Status: None   Collection Time: 10/06/21 10:14 AM  Specimen: Nasopharyngeal Swab; Nasopharyngeal(NP) swabs in vial transport medium  Result Value Ref Range Status   SARS Coronavirus 2 by RT PCR NEGATIVE NEGATIVE Final    Comment: (NOTE) SARS-CoV-2 target nucleic acids are NOT DETECTED.  The SARS-CoV-2 RNA is generally detectable in upper respiratory specimens during the acute phase of infection. The lowest concentration of SARS-CoV-2 viral copies this assay can detect is 138 copies/mL. A negative result does not preclude SARS-Cov-2 infection and should not be used as the sole basis for treatment or other patient management decisions. A negative result may occur with  improper specimen collection/handling, submission of specimen other than nasopharyngeal swab, presence of viral mutation(s) within the areas targeted by this assay, and inadequate number of viral copies(<138 copies/mL). A negative result must be combined with clinical observations, patient history, and epidemiological information. The expected result is Negative.  Fact Sheet for Patients:   EntrepreneurPulse.com.au  Fact Sheet for Healthcare Providers:  IncredibleEmployment.be  This test is no t yet approved or cleared by the [place] FDA and  has been authorized for detection and/or diagnosis of SARS-CoV-2 by FDA under an Emergency Use Authorization (EUA). This EUA will remain  in effect (meaning this test can be used) for the duration of the COVID-19 declaration under Section 564(b)(1) of the Act, 21 U.S.C.section 360bbb-3(b)(1), unless the authorization is terminated  or revoked sooner.       Influenza A by PCR NEGATIVE NEGATIVE Final   Influenza B by PCR NEGATIVE NEGATIVE Final    Comment: (NOTE) The Xpert Xpress SARS-CoV-2/FLU/RSV plus assay is intended as an aid in the diagnosis of influenza from Nasopharyngeal swab specimens and should not be used as a sole basis for treatment. Nasal washings and aspirates are unacceptable for Xpert Xpress SARS-CoV-2/FLU/RSV testing.  Fact Sheet for Patients: EntrepreneurPulse.com.au  Fact Sheet for Healthcare Providers: IncredibleEmployment.be  This test is not yet approved or cleared by the [place] FDA and has been authorized for detection and/or diagnosis of SARS-CoV-2 by FDA under an Emergency Use Authorization (EUA). This EUA will remain in effect (meaning this test can be used) for the duration of the COVID-19 declaration under Section 564(b)(1) of the Act, 21 U.S.C. section 360bbb-3(b)(1), unless the authorization is terminated or revoked.  Performed at [facility], [address] [place], [place] [address]   Urine Culture     Status: Abnormal   Collection Time: 10/06/21 12:16 PM   Specimen: In/Out Cath Urine  Result Value Ref Range Status   Specimen Description   Final    IN/OUT CATH URINE Performed at [facility], [address] [place], [place] [address]    Special Requests   Final     NONE Performed at [facility], [address] [place], [place] [address]    Culture 1,000 COLONIES/mL ENTEROCOCCUS FAECALIS (A)  Final   Report Status 10/08/2021 FINAL  Final   Organism ID, Bacteria ENTEROCOCCUS FAECALIS (A)  Final      Susceptibility   Enterococcus faecalis - MIC*    AMPICILLIN <=2 SENSITIVE Sensitive     NITROFURANTOIN <=16 SENSITIVE Sensitive     VANCOMYCIN 1 SENSITIVE Sensitive     * 1,000 COLONIES/mL ENTEROCOCCUS FAECALIS  Blood Culture (routine x 2)     Status: None (Preliminary result)   Collection Time: 10/06/21 12:30 PM   Specimen: Left Antecubital; Blood  Result Value Ref Range Status   Specimen Description   Final    LEFT  ANTECUBITAL Performed at [facility], [address] [place], [place] [address]    Special Requests   Final    BOTTLES DRAWN AEROBIC AND ANAEROBIC Blood Culture adequate volume Performed at [facility], [address] [place], [place] [address]    Culture   Final    NO GROWTH 4 DAYS Performed at [facility], [place] [address], [place], [place] [address]    Report Status PENDING  Incomplete  Blood culture (routine x 2)     Status: None (Preliminary result)   Collection Time: 10/06/21  4:53 PM   Specimen: BLOOD  Result Value Ref Range Status   Specimen Description   Final    BLOOD BLOOD RIGHT FOREARM Performed at [facility] [address], [place], [place] [address]    Special Requests   Final    BOTTLES DRAWN AEROBIC ONLY Blood Culture adequate volume Performed at [facility] [address], [place], [place] [address]    Culture   Final    NO GROWTH 3 DAYS Performed at [facility], [place] [address] [place], [place] [address]    Report Status PENDING  Incomplete  Blood Culture (routine x 2)     Status: None (Preliminary result)   Collection Time: 10/06/21  4:53 PM   Specimen: BLOOD  Result Value Ref Range Status   Specimen  Description   Final    BLOOD BLOOD RIGHT HAND Performed at [facility] [address] [place] [address], [place], [place] [address]    Special Requests   Final    BOTTLES DRAWN AEROBIC ONLY Blood Culture adequate volume Performed at [facility] [address] [place], [place] [address]    Culture   Final    NO GROWTH 3 DAYS Performed at [facility], [place] [address], [place], [place] [address]    Report Status PENDING  Incomplete    Signed:  Debbe Odea MD.  [facility] 10/10/2021, 3:28 PM

## 2021-10-10 NOTE — Progress Notes (Signed)
  Echocardiogram 2D Echocardiogram has been performed.  Jeremiah Beasley 10/10/2021, 3:36 PM

## 2021-10-10 NOTE — Discharge Instructions (Addendum)
Wound Care Instructions Wound care to right buttock wound:  Cleanse with soap and water, rinse and pat dry. Cover with xeroform gauze, top with dry gauze and cover with silicone foam dressing. Change xeroform daily, may reuse silicone foam for up to 3 days. Change PRN soiling. Position patient off of lesion.  High-Protein Foods List (2022) Your protein needs may increase or decrease if you have certain medical conditions.  Your protein goal is 100-115 grams per day.  Food Group Food Serving Amount (grams) Grains Pancakes, high protein  cup mix, dry 6-14 Pasta, high protein  cup cooked 7-13 Quinoa, farro, teff, brown rice, or whole wheat pasta 1 cup 6-8 Beans, peas, or lentils  cup 7 Bone broth 1 cup 9-13 Egg or egg substitute 1 egg or  cup egg substitute 7 Meats, poultry, fish, or seafood 1 ounce 7 Nuts  cup 7 Nut butters 2 tablespoons 6-8 Seeds 2 tablespoons 7 Soybeans (shelled edamame)  cup 7 Tofu  cup 9-11 Veggie burger 1 patty 11 Dairy and Dairy Alternatives  Cheese 1 ounce 7 Cottage cheese  cup 7 Milk 1 cup 8 Pea milk 1 cup 8 Soymilk 1 cup 7 Yogurt or kefir  cup 8 Yogurt, Greek  cup 13-16

## 2021-10-10 NOTE — Transfer of Care (Signed)
Immediate Anesthesia Transfer of Care Note  Patient: Jeremiah Beasley.  Procedure(s) Performed: ESOPHAGOGASTRODUODENOSCOPY (EGD) WITH PROPOFOL BIOPSY  Patient Location: PACU  Anesthesia Type:MAC  Level of Consciousness: sedated, patient cooperative and responds to stimulation  Airway & Oxygen Therapy: Patient Spontanous Breathing and Patient connected to face mask oxygen  Post-op Assessment: Report given to RN and Post -op Vital signs reviewed and stable  Post vital signs: Reviewed and stable  Last Vitals:  Vitals Value Taken Time  BP    Temp    Pulse 40 10/10/21 0932  Resp 9 10/10/21 0932  SpO2 100 % 10/10/21 0932  Vitals shown include unvalidated device data.  Last Pain:  Vitals:   10/10/21 0809  TempSrc: Oral  PainSc: 8          Complications: No notable events documented.

## 2021-10-10 NOTE — Progress Notes (Signed)
Hypoglycemic Event  CBG: 42  Treatment: 8 oz juice/soda  Symptoms: Shaky  Follow-up CBG: Time:2029 CBG Result:56  Possible Reasons for Event: Unknown  Comments/MD notified: Nurse driven hypoglycemic protocol initiated     Mandell Pangborn, [name]

## 2021-10-10 NOTE — TOC Transition Note (Signed)
Transition of Care [facility]) - CM/SW Discharge Note   Patient Details  Name: Jeremiah Beasley. MRN: [id number] Date of Birth: Sep 06, 1946  Transition of Care [facility]) CM/SW Contact:  Dessa Phi, RN Phone Number: 10/10/2021, 1:30 PM   Clinical Narrative:d/c today home w/HHC-no preference. [facility] accepted for HHRN-instruction on wound care/HHPT/OT. Patient/spouse already independent w/insulin pump. Offered transport to patient/spouse but they declined-they will provide own transport w/family assistance. No further CM needs.      Final next level of care: [place] Barriers to Discharge: No Barriers Identified   Patient Goals and CMS Choice Patient states their goals for this hospitalization and ongoing recovery are:: i would like to go home CMS Medicare.gov Compare Post Acute Care list provided to:: Patient    Discharge Placement                       Discharge Plan and Services   Discharge Planning Services: CM Consult                      HH Arranged: RN, PT, OT [facility] Agency: [place] Date [place]: 10/10/21 Time [place]: 1330 Representative spoke with at [place]: [place] Determinants of Health ([place]) Interventions     Readmission Risk Interventions No flowsheet data found.

## 2021-10-10 NOTE — Progress Notes (Signed)
Patient transported to EGD via wheelchair in stable condition. Dentures removed and remaining with wife at bedside. Coolidge Breeze, RN 10/10/2021

## 2021-10-10 NOTE — Progress Notes (Signed)
Patient ID: Jeremiah Beasley., male   DOB: [birth year], 75 y.o.   MRN: [id number] U/s of right buttock wound shows cellulitis with no signs of abscess. No indication for I&D. Continue antibiotics per primary team. Will add santyl to wound care. We will sign off, please call with questions or concerns.  Wellington Hampshire, [facility] 10/10/2021, 7:40 AM Please see Amion for pager number during day hours 7:00am-4:30pm

## 2021-10-10 NOTE — Progress Notes (Signed)
Initial Nutrition Assessment  DOCUMENTATION CODES:   Not applicable  INTERVENTION:  - will order chocolate milk with all meals. - entered High Protein Foods list handout from the Academy [employer] in Discharge Instructions/AVS.  - will order 1 tablet multivitamin with minerals/day.    NUTRITION DIAGNOSIS:   Inadequate oral intake related to poor appetite as evidenced by per patient/family report.  GOAL:   Patient will meet greater than or equal to 90% of their needs  MONITOR:   PO intake, Supplement acceptance, Labs, Weight trends, Skin  REASON FOR ASSESSMENT:   Malnutrition Screening Tool  ASSESSMENT:   75 y.o. male with medical history of afib on Xarelto, type 2 DM on insulin pump, diabetic polyneuropathy, GERD, HTN, HLD, hypothyroidism, and hypercholesterolemia. He was brought to the ED by his wife and son due to generalized weakness, fall x5 in 1 week, poor PO intake, and R buttocks wound. He was having intermittent fevers at home.  Patient sitting in the chair with wife at bedside. Able to obtain information from both of them.   Patient has had a poor appetite for several weeks to months and has required an extensive amount of encouragement to eat. He does share that his wife is a Systems developer and he enjoys her cooking, but that he has a very poor appetite/lack of interest in eating. He feels this is, in part, due to inability to walk or get much movement in throughout the day. He feels that with increased movement appetite would increase and he would eat more. His wife reports that he usually picks at things/only takes small bites.   Wife has purchased many types of protein shakes for him but he does not like any of them. He enjoys chocolate ice cream and milk shakes.   Talked about protein food sources, drinking chocolate milk, making milkshakes at home, and viewing food as medicine if needed.  Patient reports constipation for unknown length of time and  that he only has 1 BM/week. His wife has bought dulcolax in the past but he only took 1 dose shortly after she bought it.   Weight today is 183 lb and weight on 03/22/21 was 192 lb. This indicates 9 lb weight loss (4.7% body weight) in the past 6 months; not significant for time frame. Patient reports UBW more recently has been ~182 lb. Wife reports patient used to weigh ~205 lb.    Labs reviewed; CBGs: 146, 173, 172, 79 mg/dl, creatinine: 1.48 mg/dl, Ca: 8.6 mg/dl, GFR: 50 ml/min.   Medications reviewed; sliding scale novolog, 4 units novolog TID, 16 units semglee/day, 75 mcg oral synthroid/day, 40 mg oral protonix/day, 250 mg florastor BID.     NUTRITION - FOCUSED PHYSICAL EXAM:  Patient deferred at this time.   Diet Order:   Diet Order             DIET SOFT Room service appropriate? Yes; Fluid consistency: Thin  Diet effective now                   EDUCATION NEEDS:   Education needs have been addressed  Skin:  Skin Assessment: Skin Integrity Issues: Skin Integrity Issues:: Other (Comment) Other: non-pressure injury to R buttocks  Last BM:  PTA/unknown  Height:   Ht Readings from Last 1 Encounters:  10/06/21 5\' 9"  (1.753 m)    Weight:   Wt Readings from Last 1 Encounters:  10/10/21 82.8 kg     Estimated Nutritional Needs:  Kcal:  2070-2250 kcal Protein:  100-115 grams Fluid:  >/= 2 L/day      Jarome Matin, MS, RD, LDN, CNSC Inpatient Clinical Dietitian RD pager # available in [facility]  After hours/weekend pager # available in [facility]

## 2021-10-10 NOTE — Interval H&P Note (Signed)
History and Physical Interval Note:  10/10/2021 8:27 AM  Jeremiah Beasley.  has presented today for surgery, with the diagnosis of iron deficiency anemia, weakness, fatigue.  The various methods of treatment have been discussed with the patient and family. After consideration of risks, benefits and other options for treatment, the patient has consented to  Procedure(s): ESOPHAGOGASTRODUODENOSCOPY (EGD) WITH PROPOFOL (N/A) as a surgical intervention.  The patient's history has been reviewed, patient examined, no change in status, stable for surgery.  I have reviewed the patient's chart and labs.  Questions were answered to the patient's satisfaction.     [name]

## 2021-10-10 NOTE — Progress Notes (Signed)
Hypoglycemic Event  CBG: 56  Treatment: 8 oz juice/soda  Symptoms: Shaky  Follow-up CBG: Time:2128 CBG Result:121  Possible Reasons for Event: Unknown  Comments/MD notified:Nurse driven hypoglycemic protocol initiated     Jeremiah Beasley, [name]

## 2021-10-10 NOTE — Plan of Care (Signed)
  Problem: Education: Goal: Knowledge of General Education information will improve Description: Including pain rating scale, medication(s)/side effects and non-pharmacologic comfort measures Outcome: Progressing   Problem: Clinical Measurements: Goal: Will remain free from infection Outcome: Progressing Goal: Diagnostic test results will improve Outcome: Progressing Goal: Respiratory complications will improve Outcome: Progressing Goal: Cardiovascular complication will be avoided Outcome: Progressing   Problem: Activity: Goal: Risk for activity intolerance will decrease Outcome: Progressing

## 2021-10-11 LAB — CULTURE, BLOOD (ROUTINE X 2)
Culture: NO GROWTH
Culture: NO GROWTH
Special Requests: ADEQUATE
Special Requests: ADEQUATE

## 2021-10-12 DIAGNOSIS — E785 Hyperlipidemia, unspecified: Secondary | ICD-10-CM | POA: Diagnosis not present

## 2021-10-12 DIAGNOSIS — E1122 Type 2 diabetes mellitus with diabetic chronic kidney disease: Secondary | ICD-10-CM | POA: Diagnosis not present

## 2021-10-12 DIAGNOSIS — S31819D Unspecified open wound of right buttock, subsequent encounter: Secondary | ICD-10-CM | POA: Diagnosis not present

## 2021-10-12 DIAGNOSIS — E039 Hypothyroidism, unspecified: Secondary | ICD-10-CM | POA: Diagnosis not present

## 2021-10-12 DIAGNOSIS — D631 Anemia in chronic kidney disease: Secondary | ICD-10-CM | POA: Diagnosis not present

## 2021-10-12 DIAGNOSIS — G894 Chronic pain syndrome: Secondary | ICD-10-CM | POA: Diagnosis not present

## 2021-10-12 DIAGNOSIS — N400 Enlarged prostate without lower urinary tract symptoms: Secondary | ICD-10-CM | POA: Diagnosis not present

## 2021-10-12 DIAGNOSIS — E1165 Type 2 diabetes mellitus with hyperglycemia: Secondary | ICD-10-CM | POA: Diagnosis not present

## 2021-10-12 DIAGNOSIS — E7800 Pure hypercholesterolemia, unspecified: Secondary | ICD-10-CM | POA: Diagnosis not present

## 2021-10-12 DIAGNOSIS — K219 Gastro-esophageal reflux disease without esophagitis: Secondary | ICD-10-CM | POA: Diagnosis not present

## 2021-10-12 DIAGNOSIS — I130 Hypertensive heart and chronic kidney disease with heart failure and stage 1 through stage 4 chronic kidney disease, or unspecified chronic kidney disease: Secondary | ICD-10-CM | POA: Diagnosis not present

## 2021-10-12 DIAGNOSIS — Z8701 Personal history of pneumonia (recurrent): Secondary | ICD-10-CM | POA: Diagnosis not present

## 2021-10-12 DIAGNOSIS — M1711 Unilateral primary osteoarthritis, right knee: Secondary | ICD-10-CM | POA: Diagnosis not present

## 2021-10-12 DIAGNOSIS — N179 Acute kidney failure, unspecified: Secondary | ICD-10-CM | POA: Diagnosis not present

## 2021-10-12 DIAGNOSIS — Z794 Long term (current) use of insulin: Secondary | ICD-10-CM | POA: Diagnosis not present

## 2021-10-12 DIAGNOSIS — L03317 Cellulitis of buttock: Secondary | ICD-10-CM | POA: Diagnosis not present

## 2021-10-12 DIAGNOSIS — I480 Paroxysmal atrial fibrillation: Secondary | ICD-10-CM | POA: Diagnosis not present

## 2021-10-12 DIAGNOSIS — E871 Hypo-osmolality and hyponatremia: Secondary | ICD-10-CM | POA: Diagnosis not present

## 2021-10-12 DIAGNOSIS — E1142 Type 2 diabetes mellitus with diabetic polyneuropathy: Secondary | ICD-10-CM | POA: Diagnosis not present

## 2021-10-12 DIAGNOSIS — Z7901 Long term (current) use of anticoagulants: Secondary | ICD-10-CM | POA: Diagnosis not present

## 2021-10-12 DIAGNOSIS — N1831 Chronic kidney disease, stage 3a: Secondary | ICD-10-CM | POA: Diagnosis not present

## 2021-10-12 DIAGNOSIS — I5032 Chronic diastolic (congestive) heart failure: Secondary | ICD-10-CM | POA: Diagnosis not present

## 2021-10-12 LAB — CULTURE, BLOOD (ROUTINE X 2)
Culture: NO GROWTH
Culture: NO GROWTH
Special Requests: ADEQUATE
Special Requests: ADEQUATE

## 2021-10-12 LAB — SURGICAL PATHOLOGY

## 2021-10-13 LAB — TYPE AND SCREEN
ABO/RH(D): A NEG
Antibody Screen: NEGATIVE
Unit division: 0
Unit division: 0

## 2021-10-13 LAB — BPAM RBC
Blood Product Expiration Date: 202212262359
Blood Product Expiration Date: 202212262359
ISSUE DATE / TIME: 202211221327
Unit Type and Rh: 600
Unit Type and Rh: 600

## 2021-10-14 ENCOUNTER — Encounter (HOSPITAL_COMMUNITY): Payer: Self-pay | Admitting: Gastroenterology

## 2021-10-16 DIAGNOSIS — L03317 Cellulitis of buttock: Secondary | ICD-10-CM | POA: Diagnosis not present

## 2021-10-16 DIAGNOSIS — I130 Hypertensive heart and chronic kidney disease with heart failure and stage 1 through stage 4 chronic kidney disease, or unspecified chronic kidney disease: Secondary | ICD-10-CM | POA: Diagnosis not present

## 2021-10-16 DIAGNOSIS — I5032 Chronic diastolic (congestive) heart failure: Secondary | ICD-10-CM | POA: Diagnosis not present

## 2021-10-16 DIAGNOSIS — N1831 Chronic kidney disease, stage 3a: Secondary | ICD-10-CM | POA: Diagnosis not present

## 2021-10-16 DIAGNOSIS — S31819D Unspecified open wound of right buttock, subsequent encounter: Secondary | ICD-10-CM | POA: Diagnosis not present

## 2021-10-16 DIAGNOSIS — E1122 Type 2 diabetes mellitus with diabetic chronic kidney disease: Secondary | ICD-10-CM | POA: Diagnosis not present

## 2021-10-18 DIAGNOSIS — L03317 Cellulitis of buttock: Secondary | ICD-10-CM | POA: Diagnosis not present

## 2021-10-18 DIAGNOSIS — E1122 Type 2 diabetes mellitus with diabetic chronic kidney disease: Secondary | ICD-10-CM | POA: Diagnosis not present

## 2021-10-18 DIAGNOSIS — I130 Hypertensive heart and chronic kidney disease with heart failure and stage 1 through stage 4 chronic kidney disease, or unspecified chronic kidney disease: Secondary | ICD-10-CM | POA: Diagnosis not present

## 2021-10-18 DIAGNOSIS — I5032 Chronic diastolic (congestive) heart failure: Secondary | ICD-10-CM | POA: Diagnosis not present

## 2021-10-18 DIAGNOSIS — S31819D Unspecified open wound of right buttock, subsequent encounter: Secondary | ICD-10-CM | POA: Diagnosis not present

## 2021-10-18 DIAGNOSIS — N1831 Chronic kidney disease, stage 3a: Secondary | ICD-10-CM | POA: Diagnosis not present

## 2021-10-19 DIAGNOSIS — S31819D Unspecified open wound of right buttock, subsequent encounter: Secondary | ICD-10-CM | POA: Diagnosis not present

## 2021-10-19 DIAGNOSIS — N1831 Chronic kidney disease, stage 3a: Secondary | ICD-10-CM | POA: Diagnosis not present

## 2021-10-19 DIAGNOSIS — E1122 Type 2 diabetes mellitus with diabetic chronic kidney disease: Secondary | ICD-10-CM | POA: Diagnosis not present

## 2021-10-19 DIAGNOSIS — I5032 Chronic diastolic (congestive) heart failure: Secondary | ICD-10-CM | POA: Diagnosis not present

## 2021-10-19 DIAGNOSIS — L03317 Cellulitis of buttock: Secondary | ICD-10-CM | POA: Diagnosis not present

## 2021-10-19 DIAGNOSIS — I130 Hypertensive heart and chronic kidney disease with heart failure and stage 1 through stage 4 chronic kidney disease, or unspecified chronic kidney disease: Secondary | ICD-10-CM | POA: Diagnosis not present

## 2021-10-22 DIAGNOSIS — N1831 Chronic kidney disease, stage 3a: Secondary | ICD-10-CM | POA: Diagnosis not present

## 2021-10-22 DIAGNOSIS — I130 Hypertensive heart and chronic kidney disease with heart failure and stage 1 through stage 4 chronic kidney disease, or unspecified chronic kidney disease: Secondary | ICD-10-CM | POA: Diagnosis not present

## 2021-10-22 DIAGNOSIS — E1122 Type 2 diabetes mellitus with diabetic chronic kidney disease: Secondary | ICD-10-CM | POA: Diagnosis not present

## 2021-10-22 DIAGNOSIS — S31819D Unspecified open wound of right buttock, subsequent encounter: Secondary | ICD-10-CM | POA: Diagnosis not present

## 2021-10-22 DIAGNOSIS — I5032 Chronic diastolic (congestive) heart failure: Secondary | ICD-10-CM | POA: Diagnosis not present

## 2021-10-22 DIAGNOSIS — L03317 Cellulitis of buttock: Secondary | ICD-10-CM | POA: Diagnosis not present

## 2021-10-23 DIAGNOSIS — N1831 Chronic kidney disease, stage 3a: Secondary | ICD-10-CM | POA: Diagnosis not present

## 2021-10-23 DIAGNOSIS — I130 Hypertensive heart and chronic kidney disease with heart failure and stage 1 through stage 4 chronic kidney disease, or unspecified chronic kidney disease: Secondary | ICD-10-CM | POA: Diagnosis not present

## 2021-10-23 DIAGNOSIS — I5032 Chronic diastolic (congestive) heart failure: Secondary | ICD-10-CM | POA: Diagnosis not present

## 2021-10-23 DIAGNOSIS — E1122 Type 2 diabetes mellitus with diabetic chronic kidney disease: Secondary | ICD-10-CM | POA: Diagnosis not present

## 2021-10-23 DIAGNOSIS — S31819D Unspecified open wound of right buttock, subsequent encounter: Secondary | ICD-10-CM | POA: Diagnosis not present

## 2021-10-23 DIAGNOSIS — L03317 Cellulitis of buttock: Secondary | ICD-10-CM | POA: Diagnosis not present

## 2021-10-25 DIAGNOSIS — L89313 Pressure ulcer of right buttock, stage 3: Secondary | ICD-10-CM | POA: Diagnosis not present

## 2021-10-26 DIAGNOSIS — E1122 Type 2 diabetes mellitus with diabetic chronic kidney disease: Secondary | ICD-10-CM | POA: Diagnosis not present

## 2021-10-26 DIAGNOSIS — S31819D Unspecified open wound of right buttock, subsequent encounter: Secondary | ICD-10-CM | POA: Diagnosis not present

## 2021-10-26 DIAGNOSIS — I5032 Chronic diastolic (congestive) heart failure: Secondary | ICD-10-CM | POA: Diagnosis not present

## 2021-10-26 DIAGNOSIS — L03317 Cellulitis of buttock: Secondary | ICD-10-CM | POA: Diagnosis not present

## 2021-10-26 DIAGNOSIS — I130 Hypertensive heart and chronic kidney disease with heart failure and stage 1 through stage 4 chronic kidney disease, or unspecified chronic kidney disease: Secondary | ICD-10-CM | POA: Diagnosis not present

## 2021-10-26 DIAGNOSIS — N1831 Chronic kidney disease, stage 3a: Secondary | ICD-10-CM | POA: Diagnosis not present

## 2021-10-30 DIAGNOSIS — N1831 Chronic kidney disease, stage 3a: Secondary | ICD-10-CM | POA: Diagnosis not present

## 2021-10-30 DIAGNOSIS — I5032 Chronic diastolic (congestive) heart failure: Secondary | ICD-10-CM | POA: Diagnosis not present

## 2021-10-30 DIAGNOSIS — S31819D Unspecified open wound of right buttock, subsequent encounter: Secondary | ICD-10-CM | POA: Diagnosis not present

## 2021-10-30 DIAGNOSIS — E1122 Type 2 diabetes mellitus with diabetic chronic kidney disease: Secondary | ICD-10-CM | POA: Diagnosis not present

## 2021-10-30 DIAGNOSIS — I130 Hypertensive heart and chronic kidney disease with heart failure and stage 1 through stage 4 chronic kidney disease, or unspecified chronic kidney disease: Secondary | ICD-10-CM | POA: Diagnosis not present

## 2021-10-30 DIAGNOSIS — L03317 Cellulitis of buttock: Secondary | ICD-10-CM | POA: Diagnosis not present

## 2021-11-02 DIAGNOSIS — N1831 Chronic kidney disease, stage 3a: Secondary | ICD-10-CM | POA: Diagnosis not present

## 2021-11-02 DIAGNOSIS — Z22322 Carrier or suspected carrier of Methicillin resistant Staphylococcus aureus: Secondary | ICD-10-CM | POA: Insufficient documentation

## 2021-11-02 DIAGNOSIS — I130 Hypertensive heart and chronic kidney disease with heart failure and stage 1 through stage 4 chronic kidney disease, or unspecified chronic kidney disease: Secondary | ICD-10-CM | POA: Diagnosis not present

## 2021-11-02 DIAGNOSIS — E1122 Type 2 diabetes mellitus with diabetic chronic kidney disease: Secondary | ICD-10-CM | POA: Diagnosis not present

## 2021-11-02 DIAGNOSIS — L03317 Cellulitis of buttock: Secondary | ICD-10-CM | POA: Diagnosis not present

## 2021-11-02 DIAGNOSIS — I5032 Chronic diastolic (congestive) heart failure: Secondary | ICD-10-CM | POA: Diagnosis not present

## 2021-11-02 DIAGNOSIS — S31819D Unspecified open wound of right buttock, subsequent encounter: Secondary | ICD-10-CM | POA: Diagnosis not present

## 2021-11-06 DIAGNOSIS — I130 Hypertensive heart and chronic kidney disease with heart failure and stage 1 through stage 4 chronic kidney disease, or unspecified chronic kidney disease: Secondary | ICD-10-CM | POA: Diagnosis not present

## 2021-11-06 DIAGNOSIS — N1831 Chronic kidney disease, stage 3a: Secondary | ICD-10-CM | POA: Diagnosis not present

## 2021-11-06 DIAGNOSIS — I5032 Chronic diastolic (congestive) heart failure: Secondary | ICD-10-CM | POA: Diagnosis not present

## 2021-11-06 DIAGNOSIS — L03317 Cellulitis of buttock: Secondary | ICD-10-CM | POA: Diagnosis not present

## 2021-11-06 DIAGNOSIS — E1122 Type 2 diabetes mellitus with diabetic chronic kidney disease: Secondary | ICD-10-CM | POA: Diagnosis not present

## 2021-11-06 DIAGNOSIS — S31819D Unspecified open wound of right buttock, subsequent encounter: Secondary | ICD-10-CM | POA: Diagnosis not present

## 2021-11-08 DIAGNOSIS — E1122 Type 2 diabetes mellitus with diabetic chronic kidney disease: Secondary | ICD-10-CM | POA: Diagnosis not present

## 2021-11-08 DIAGNOSIS — I5032 Chronic diastolic (congestive) heart failure: Secondary | ICD-10-CM | POA: Diagnosis not present

## 2021-11-08 DIAGNOSIS — L03317 Cellulitis of buttock: Secondary | ICD-10-CM | POA: Diagnosis not present

## 2021-11-08 DIAGNOSIS — N1831 Chronic kidney disease, stage 3a: Secondary | ICD-10-CM | POA: Diagnosis not present

## 2021-11-08 DIAGNOSIS — I130 Hypertensive heart and chronic kidney disease with heart failure and stage 1 through stage 4 chronic kidney disease, or unspecified chronic kidney disease: Secondary | ICD-10-CM | POA: Diagnosis not present

## 2021-11-08 DIAGNOSIS — S31819D Unspecified open wound of right buttock, subsequent encounter: Secondary | ICD-10-CM | POA: Diagnosis not present

## 2021-11-09 ENCOUNTER — Ambulatory Visit (INDEPENDENT_AMBULATORY_CARE_PROVIDER_SITE_OTHER): Payer: Medicare Other | Admitting: Cardiology

## 2021-11-09 ENCOUNTER — Other Ambulatory Visit: Payer: Self-pay

## 2021-11-09 ENCOUNTER — Encounter: Payer: Self-pay | Admitting: Cardiology

## 2021-11-09 VITALS — BP 150/76 | HR 61 | Ht 69.5 in | Wt 183.4 lb

## 2021-11-09 DIAGNOSIS — R001 Bradycardia, unspecified: Secondary | ICD-10-CM | POA: Diagnosis not present

## 2021-11-09 DIAGNOSIS — R531 Weakness: Secondary | ICD-10-CM | POA: Diagnosis not present

## 2021-11-09 DIAGNOSIS — E871 Hypo-osmolality and hyponatremia: Secondary | ICD-10-CM | POA: Diagnosis not present

## 2021-11-09 DIAGNOSIS — N1832 Chronic kidney disease, stage 3b: Secondary | ICD-10-CM | POA: Diagnosis not present

## 2021-11-09 DIAGNOSIS — L0390 Cellulitis, unspecified: Secondary | ICD-10-CM | POA: Diagnosis not present

## 2021-11-09 DIAGNOSIS — I10 Essential (primary) hypertension: Secondary | ICD-10-CM | POA: Diagnosis not present

## 2021-11-09 DIAGNOSIS — I480 Paroxysmal atrial fibrillation: Secondary | ICD-10-CM | POA: Diagnosis not present

## 2021-11-09 DIAGNOSIS — E1022 Type 1 diabetes mellitus with diabetic chronic kidney disease: Secondary | ICD-10-CM | POA: Diagnosis not present

## 2021-11-09 DIAGNOSIS — D649 Anemia, unspecified: Secondary | ICD-10-CM | POA: Diagnosis not present

## 2021-11-09 NOTE — Progress Notes (Signed)
Cardiology Office Note:    Date:  11/09/2021   ID:  Jeremiah Beasley., DOB June 03, 1946, MRN [id number]  PCP:  [name], MD  Cardiologist:  [name], MD    Referring MD: [name], [name], MD   Chief Complaint  Patient presents with   hospital visit at [facility] 09/2021    History of Present Illness:    Jeremiah Beasley. is a 75 y.o. male   with past medical history significant for paroxysmal atrial fibrillation, initially successfully suppressed with amiodarone but then became bradycardic and amiodarone actually was discontinued.  Also have history of diabetes, essential hypertension, dyslipidemia.  He is intolerant to ACE inhibitor because of tongue swelling.  Recently he ended going to the hospital with ruptured gallbladder required surgery and went to atrial fibrillation after surgery required amiodarone again to control his rate.  Eventually he converted to sinus rhythm maintaining his sinus rhythm.  About a month ago he ended up going to the hospital because of hyponatremia.  He also was noted to have significant anemia.  Amiodarone digoxin has been withdrawn he seems to maintaining sinus rhythm he also got pressure ulcer on his buttock he comes today to my office he looks exhausted and tired.  He does not have appetite he does not feel well.  He spent majority of time sitting in the chair.  He comes to the office with his wife denies have any chest pain tightness squeezing pressure burning chest no palpitations no dizziness.  Past Medical History:  Diagnosis Date   Atrial fibrillation (HCC)    Chronic pain of right knee 04/11/2020   Diabetes mellitus without complication ([place])    Essential hypertension 05/15/2015   Hyperlipidemia    Hypertension    MRSA (methicillin resistant staph aureus) culture positive    Paroxysmal atrial fibrillation (HCC) 05/15/2015   Pneumonia 10/20/2020   Pure hypercholesterolemia 05/15/2015   S/P right knee arthroscopy 04/25/2020   Type 2 diabetes  mellitus without complication ([place]) 33/54/5625    Past Surgical History:  Procedure Laterality Date   BIOPSY  10/10/2021   Procedure: BIOPSY;  Surgeon: Otis Brace, MD;  Location: [facility];  Service: Gastroenterology;;   ESOPHAGOGASTRODUODENOSCOPY (EGD) WITH PROPOFOL N/A 10/10/2021   Procedure: ESOPHAGOGASTRODUODENOSCOPY (EGD) WITH PROPOFOL;  Surgeon: Otis Brace, MD;  Location: [facility];  Service: Gastroenterology;  Laterality: N/A;   gall bladder removed   10/20/2020   Iron infusion  05/18/2021   x2   NO PAST SURGERIES      Current Medications: Current Meds  Medication Sig   acetaminophen (TYLENOL) 325 MG tablet Take 2 tablets (650 mg total) by mouth every 6 (six) hours as needed for mild pain, fever or headache.   diphenhydrAMINE (BENADRYL) 25 mg capsule Take 25 mg by mouth at bedtime.   ezetimibe (ZETIA) 10 MG tablet Take 1 tablet (10 mg total) by mouth daily.   gabapentin (NEURONTIN) 300 MG capsule Take 1 capsule (300 mg total) by mouth 2 (two) times daily.   hydrALAZINE (APRESOLINE) 100 MG tablet Take 100 mg by mouth 3 (three) times daily.   Insulin Human (INSULIN PUMP) SOLN Inject 1 each into the skin continuous.   levothyroxine (SYNTHROID, LEVOTHROID) 75 MCG tablet Take 75 mcg by mouth daily before breakfast.   Multiple Vitamins-Minerals (CENTRUM SILVER ADULT 50+ PO) Take 1 tablet by mouth daily with breakfast.   omeprazole (PRILOSEC) 40 MG capsule Take 40 mg by mouth daily before supper.   rivaroxaban (XARELTO) 20 MG TABS tablet  Take 20 mg by mouth daily with supper.   simvastatin (ZOCOR) 80 MG tablet Take 80 mg by mouth daily.     Allergies:   Quinapril, Methaqualone, and Tape   Social History   Socioeconomic History   Marital status: Married    Spouse name: Not on file   Number of children: Not on file   Years of education: Not on file   Highest education level: Not on file  Occupational History   Not on file  Tobacco Use   Smoking status:  Never   Smokeless tobacco: Current  Vaping Use   Vaping Use: Never used  Substance and Sexual Activity   Alcohol use: No   Drug use: No   Sexual activity: Not on file  Other Topics Concern   Not on file  Social History Narrative   Likes bird/deer watching, grand children   Social Determinants of Health   Financial Resource Strain: Low Risk    Difficulty of Paying Living Expenses: Not hard at all  Food Insecurity: No Food Insecurity   Worried About Charity fundraiser in the Last Year: Never true   [place] in the Last Year: Never true  Transportation Needs: No Transportation Needs   Lack of Transportation (Medical): No   Lack of Transportation (Non-Medical): No  Physical Activity: Not on file  Stress: No Stress Concern Present   Feeling of Stress : Only a little  Social Connections: Not on file     Family History: The patient's family history includes Heart attack in his father; Hyperlipidemia in his father; Stroke in his father. ROS:   Please see the history of present illness.    All 14 point review of systems negative except as described per history of present illness  EKGs/Labs/Other Studies Reviewed:      Recent Labs: 10/06/2021: B Natriuretic Peptide 88.7 10/07/2021: ALT 18; TSH 5.005 10/10/2021: BUN 16; Creatinine, Ser 1.48; Hemoglobin 9.1; Magnesium 2.0; Platelets 214; Potassium 3.8; Sodium 136  Recent Lipid Panel    Component Value Date/Time   CHOL 134 05/31/2020 1324   TRIG 82 05/31/2020 1324   HDL 32 (L) 05/31/2020 1324   CHOLHDL 4.2 05/31/2020 1324   LDLCALC 86 05/31/2020 1324    Physical Exam:    VS:  BP (!) 150/76 (BP Location: Left Arm, Patient Position: Sitting)    Pulse 61    Ht 5' 9.5" (1.765 m)    Wt 183 lb 6.4 oz (83.2 kg)    SpO2 96%    BMI 26.70 kg/m     Wt Readings from Last 3 Encounters:  11/09/21 183 lb 6.4 oz (83.2 kg)  10/10/21 182 lb 9.6 oz (82.8 kg)  09/24/21 184 lb (83.5 kg)     GEN:  Well nourished, well developed  in no acute distress HEENT: Normal NECK: No JVD; No carotid bruits LYMPHATICS: No lymphadenopathy CARDIAC: RRR, no murmurs, no rubs, no gallops RESPIRATORY:  Clear to auscultation without rales, wheezing or rhonchi  ABDOMEN: Soft, non-tender, non-distended MUSCULOSKELETAL:  No edema; No deformity  SKIN: Warm and dry LOWER EXTREMITIES: no swelling NEUROLOGIC:  Alert and oriented x 3 PSYCHIATRIC:  Normal affect   ASSESSMENT:    1. Paroxysmal atrial fibrillation (HCC)   2. Essential (primary) hypertension   3. Type 1 diabetes mellitus with stage 3b chronic kidney disease ([place])   4. Bradycardia    PLAN:    In order of problems listed above:  Paroxysmal atrial fibrillation seems to be  in sinus rhythm today he is anticoagulant Xarelto however I suspect doses have too high.  I will check his complete metabolic panel based on that we will adjust his dose of Xarelto I suspect he will require to 15 mg daily.  He is off amiodarone and likely maintaining sinus rhythm, History of anemia: We will do his CBC. Essential hypertension mildly elevated but he is upset today and exhausted.  We will continue monitoring. Type 1 diabetes has been using insulin pump however she does have appointment with endocrinologist to adjust that since he does have some episode of hypoglycemia when he is not eating well. Bradycardia not noted today on the physical exam.  We will continue monitoring. I reviewed records for this visit from hospitalization Medication Adjustments/Labs and Tests Ordered: Current medicines are reviewed at length with the patient today.  Concerns regarding medicines are outlined above.  No orders of the defined types were placed in this encounter.  Medication changes: No orders of the defined types were placed in this encounter.   Signed, Park Liter, MD, [facility] 11/09/2021 9:02 AM    [place]

## 2021-11-09 NOTE — Patient Instructions (Signed)
Medication Instructions:  Your physician recommends that you continue on your current medications as directed. Please refer to the Current Medication list given to you today.  *If you need a refill on your cardiac medications before your next appointment, please call your pharmacy*   Lab Work: Your physician recommends that you should have a BMET and CBC today in the office.  If you have labs (blood work) drawn today and your tests are completely normal, you will receive your results only by: [place] (if you have MyChart) OR A paper copy in the mail If you have any lab test that is abnormal or we need to change your treatment, we will call you to review the results.   Testing/Procedures: None ordered   Follow-Up: At [facility], you and your health needs are our priority.  As part of our continuing mission to provide you with exceptional heart care, we have created designated Provider Care Teams.  These Care Teams include your primary Cardiologist (physician) and Advanced Practice Providers (APPs -  Physician Assistants and Nurse Practitioners) who all work together to provide you with the care you need, when you need it.  We recommend signing up for the patient portal called "MyChart".  Sign up information is provided on this After Visit Summary.  MyChart is used to connect with patients for Virtual Visits (Telemedicine).  Patients are able to view lab/test results, encounter notes, upcoming appointments, etc.  Non-urgent messages can be sent to your provider as well.   To learn more about what you can do with MyChart, go to [email or web address].    Your next appointment:   1 month(s)  The format for your next appointment:   In Person  Provider:   [name], MD   Other Instructions NA

## 2021-11-10 LAB — CBC WITH DIFFERENTIAL/PLATELET
Basophils Absolute: 0 10*3/uL (ref 0.0–0.2)
Basos: 1 %
EOS (ABSOLUTE): 0.1 10*3/uL (ref 0.0–0.4)
Eos: 2 %
Hematocrit: 31.2 % — ABNORMAL LOW (ref 37.5–51.0)
Hemoglobin: 10.4 g/dL — ABNORMAL LOW (ref 13.0–17.7)
Immature Grans (Abs): 0 10*3/uL (ref 0.0–0.1)
Immature Granulocytes: 1 %
Lymphocytes Absolute: 0.5 10*3/uL — ABNORMAL LOW (ref 0.7–3.1)
Lymphs: 17 %
MCH: 29.4 pg (ref 26.6–33.0)
MCHC: 33.3 g/dL (ref 31.5–35.7)
MCV: 88 fL (ref 79–97)
Monocytes Absolute: 0.3 10*3/uL (ref 0.1–0.9)
Monocytes: 11 %
Neutrophils Absolute: 1.9 10*3/uL (ref 1.4–7.0)
Neutrophils: 68 %
Platelets: 130 10*3/uL — ABNORMAL LOW (ref 150–450)
RBC: 3.54 x10E6/uL — ABNORMAL LOW (ref 4.14–5.80)
RDW: 12.5 % (ref 11.6–15.4)
WBC: 2.8 10*3/uL — ABNORMAL LOW (ref 3.4–10.8)

## 2021-11-10 LAB — BASIC METABOLIC PANEL
BUN/Creatinine Ratio: 11 (ref 10–24)
BUN: 19 mg/dL (ref 8–27)
CO2: 23 mmol/L (ref 20–29)
Calcium: 8.4 mg/dL — ABNORMAL LOW (ref 8.6–10.2)
Chloride: 97 mmol/L (ref 96–106)
Creatinine, Ser: 1.73 mg/dL — ABNORMAL HIGH (ref 0.76–1.27)
Glucose: 95 mg/dL (ref 70–99)
Potassium: 4.3 mmol/L (ref 3.5–5.2)
Sodium: 133 mmol/L — ABNORMAL LOW (ref 134–144)
eGFR: 41 mL/min/{1.73_m2} — ABNORMAL LOW (ref >59)

## 2021-11-11 DIAGNOSIS — S31819D Unspecified open wound of right buttock, subsequent encounter: Secondary | ICD-10-CM | POA: Diagnosis not present

## 2021-11-11 DIAGNOSIS — G894 Chronic pain syndrome: Secondary | ICD-10-CM | POA: Diagnosis not present

## 2021-11-11 DIAGNOSIS — Z8701 Personal history of pneumonia (recurrent): Secondary | ICD-10-CM | POA: Diagnosis not present

## 2021-11-11 DIAGNOSIS — N400 Enlarged prostate without lower urinary tract symptoms: Secondary | ICD-10-CM | POA: Diagnosis not present

## 2021-11-11 DIAGNOSIS — Z7901 Long term (current) use of anticoagulants: Secondary | ICD-10-CM | POA: Diagnosis not present

## 2021-11-11 DIAGNOSIS — E039 Hypothyroidism, unspecified: Secondary | ICD-10-CM | POA: Diagnosis not present

## 2021-11-11 DIAGNOSIS — E871 Hypo-osmolality and hyponatremia: Secondary | ICD-10-CM | POA: Diagnosis not present

## 2021-11-11 DIAGNOSIS — E1142 Type 2 diabetes mellitus with diabetic polyneuropathy: Secondary | ICD-10-CM | POA: Diagnosis not present

## 2021-11-11 DIAGNOSIS — E1165 Type 2 diabetes mellitus with hyperglycemia: Secondary | ICD-10-CM | POA: Diagnosis not present

## 2021-11-11 DIAGNOSIS — I130 Hypertensive heart and chronic kidney disease with heart failure and stage 1 through stage 4 chronic kidney disease, or unspecified chronic kidney disease: Secondary | ICD-10-CM | POA: Diagnosis not present

## 2021-11-11 DIAGNOSIS — Z794 Long term (current) use of insulin: Secondary | ICD-10-CM | POA: Diagnosis not present

## 2021-11-11 DIAGNOSIS — E785 Hyperlipidemia, unspecified: Secondary | ICD-10-CM | POA: Diagnosis not present

## 2021-11-11 DIAGNOSIS — E1122 Type 2 diabetes mellitus with diabetic chronic kidney disease: Secondary | ICD-10-CM | POA: Diagnosis not present

## 2021-11-11 DIAGNOSIS — L03317 Cellulitis of buttock: Secondary | ICD-10-CM | POA: Diagnosis not present

## 2021-11-11 DIAGNOSIS — E7800 Pure hypercholesterolemia, unspecified: Secondary | ICD-10-CM | POA: Diagnosis not present

## 2021-11-11 DIAGNOSIS — K219 Gastro-esophageal reflux disease without esophagitis: Secondary | ICD-10-CM | POA: Diagnosis not present

## 2021-11-11 DIAGNOSIS — N179 Acute kidney failure, unspecified: Secondary | ICD-10-CM | POA: Diagnosis not present

## 2021-11-11 DIAGNOSIS — M1711 Unilateral primary osteoarthritis, right knee: Secondary | ICD-10-CM | POA: Diagnosis not present

## 2021-11-11 DIAGNOSIS — N1831 Chronic kidney disease, stage 3a: Secondary | ICD-10-CM | POA: Diagnosis not present

## 2021-11-11 DIAGNOSIS — I480 Paroxysmal atrial fibrillation: Secondary | ICD-10-CM | POA: Diagnosis not present

## 2021-11-11 DIAGNOSIS — D631 Anemia in chronic kidney disease: Secondary | ICD-10-CM | POA: Diagnosis not present

## 2021-11-11 DIAGNOSIS — I5032 Chronic diastolic (congestive) heart failure: Secondary | ICD-10-CM | POA: Diagnosis not present

## 2021-11-13 DIAGNOSIS — I130 Hypertensive heart and chronic kidney disease with heart failure and stage 1 through stage 4 chronic kidney disease, or unspecified chronic kidney disease: Secondary | ICD-10-CM | POA: Diagnosis not present

## 2021-11-13 DIAGNOSIS — I5032 Chronic diastolic (congestive) heart failure: Secondary | ICD-10-CM | POA: Diagnosis not present

## 2021-11-13 DIAGNOSIS — E1122 Type 2 diabetes mellitus with diabetic chronic kidney disease: Secondary | ICD-10-CM | POA: Diagnosis not present

## 2021-11-13 DIAGNOSIS — S31819D Unspecified open wound of right buttock, subsequent encounter: Secondary | ICD-10-CM | POA: Diagnosis not present

## 2021-11-13 DIAGNOSIS — L03317 Cellulitis of buttock: Secondary | ICD-10-CM | POA: Diagnosis not present

## 2021-11-13 DIAGNOSIS — N1831 Chronic kidney disease, stage 3a: Secondary | ICD-10-CM | POA: Diagnosis not present

## 2021-11-15 DIAGNOSIS — L89313 Pressure ulcer of right buttock, stage 3: Secondary | ICD-10-CM | POA: Diagnosis not present

## 2021-11-15 DIAGNOSIS — D649 Anemia, unspecified: Secondary | ICD-10-CM | POA: Diagnosis not present

## 2021-11-15 DIAGNOSIS — R5381 Other malaise: Secondary | ICD-10-CM | POA: Diagnosis not present

## 2021-11-16 DIAGNOSIS — S31819D Unspecified open wound of right buttock, subsequent encounter: Secondary | ICD-10-CM | POA: Diagnosis not present

## 2021-11-16 DIAGNOSIS — I130 Hypertensive heart and chronic kidney disease with heart failure and stage 1 through stage 4 chronic kidney disease, or unspecified chronic kidney disease: Secondary | ICD-10-CM | POA: Diagnosis not present

## 2021-11-16 DIAGNOSIS — L03317 Cellulitis of buttock: Secondary | ICD-10-CM | POA: Diagnosis not present

## 2021-11-16 DIAGNOSIS — E1122 Type 2 diabetes mellitus with diabetic chronic kidney disease: Secondary | ICD-10-CM | POA: Diagnosis not present

## 2021-11-16 DIAGNOSIS — N1831 Chronic kidney disease, stage 3a: Secondary | ICD-10-CM | POA: Diagnosis not present

## 2021-11-16 DIAGNOSIS — I5032 Chronic diastolic (congestive) heart failure: Secondary | ICD-10-CM | POA: Diagnosis not present

## 2021-11-22 DIAGNOSIS — R509 Fever, unspecified: Secondary | ICD-10-CM | POA: Diagnosis not present

## 2021-11-22 DIAGNOSIS — I5032 Chronic diastolic (congestive) heart failure: Secondary | ICD-10-CM | POA: Diagnosis not present

## 2021-11-22 DIAGNOSIS — I130 Hypertensive heart and chronic kidney disease with heart failure and stage 1 through stage 4 chronic kidney disease, or unspecified chronic kidney disease: Secondary | ICD-10-CM | POA: Diagnosis not present

## 2021-11-22 DIAGNOSIS — E1122 Type 2 diabetes mellitus with diabetic chronic kidney disease: Secondary | ICD-10-CM | POA: Diagnosis not present

## 2021-11-22 DIAGNOSIS — S31819D Unspecified open wound of right buttock, subsequent encounter: Secondary | ICD-10-CM | POA: Diagnosis not present

## 2021-11-22 DIAGNOSIS — L03317 Cellulitis of buttock: Secondary | ICD-10-CM | POA: Diagnosis not present

## 2021-11-22 DIAGNOSIS — N1831 Chronic kidney disease, stage 3a: Secondary | ICD-10-CM | POA: Diagnosis not present

## 2021-11-22 DIAGNOSIS — Z743 Need for continuous supervision: Secondary | ICD-10-CM | POA: Diagnosis not present

## 2021-11-23 ENCOUNTER — Emergency Department (HOSPITAL_COMMUNITY): Payer: Medicare Other

## 2021-11-23 ENCOUNTER — Other Ambulatory Visit: Payer: Self-pay

## 2021-11-23 ENCOUNTER — Inpatient Hospital Stay (HOSPITAL_COMMUNITY)
Admission: EM | Admit: 2021-11-23 | Discharge: 2021-11-27 | DRG: 602 | Disposition: A | Discharge status: Home Health | Payer: Medicare Other | Attending: Internal Medicine | Admitting: Internal Medicine

## 2021-11-23 ENCOUNTER — Encounter (HOSPITAL_COMMUNITY): Payer: Self-pay | Admitting: Emergency Medicine

## 2021-11-23 DIAGNOSIS — L89323 Pressure ulcer of left buttock, stage 3: Secondary | ICD-10-CM | POA: Diagnosis present

## 2021-11-23 DIAGNOSIS — E1042 Type 1 diabetes mellitus with diabetic polyneuropathy: Secondary | ICD-10-CM | POA: Diagnosis present

## 2021-11-23 DIAGNOSIS — R627 Adult failure to thrive: Secondary | ICD-10-CM | POA: Diagnosis present

## 2021-11-23 DIAGNOSIS — K2950 Unspecified chronic gastritis without bleeding: Secondary | ICD-10-CM | POA: Diagnosis present

## 2021-11-23 DIAGNOSIS — Z9641 Presence of insulin pump (external) (internal): Secondary | ICD-10-CM | POA: Diagnosis present

## 2021-11-23 DIAGNOSIS — E43 Unspecified severe protein-calorie malnutrition: Secondary | ICD-10-CM | POA: Diagnosis present

## 2021-11-23 DIAGNOSIS — S62642A Nondisplaced fracture of proximal phalanx of right middle finger, initial encounter for closed fracture: Secondary | ICD-10-CM | POA: Diagnosis present

## 2021-11-23 DIAGNOSIS — D649 Anemia, unspecified: Secondary | ICD-10-CM | POA: Diagnosis not present

## 2021-11-23 DIAGNOSIS — I129 Hypertensive chronic kidney disease with stage 1 through stage 4 chronic kidney disease, or unspecified chronic kidney disease: Secondary | ICD-10-CM | POA: Diagnosis present

## 2021-11-23 DIAGNOSIS — L8990 Pressure ulcer of unspecified site, unspecified stage: Secondary | ICD-10-CM | POA: Diagnosis present

## 2021-11-23 DIAGNOSIS — Z20822 Contact with and (suspected) exposure to covid-19: Secondary | ICD-10-CM | POA: Diagnosis present

## 2021-11-23 DIAGNOSIS — L03317 Cellulitis of buttock: Secondary | ICD-10-CM | POA: Diagnosis not present

## 2021-11-23 DIAGNOSIS — R791 Abnormal coagulation profile: Secondary | ICD-10-CM | POA: Diagnosis present

## 2021-11-23 DIAGNOSIS — E860 Dehydration: Secondary | ICD-10-CM | POA: Diagnosis present

## 2021-11-23 DIAGNOSIS — I480 Paroxysmal atrial fibrillation: Secondary | ICD-10-CM | POA: Diagnosis present

## 2021-11-23 DIAGNOSIS — E1022 Type 1 diabetes mellitus with diabetic chronic kidney disease: Secondary | ICD-10-CM | POA: Diagnosis not present

## 2021-11-23 DIAGNOSIS — D509 Iron deficiency anemia, unspecified: Secondary | ICD-10-CM | POA: Diagnosis present

## 2021-11-23 DIAGNOSIS — R531 Weakness: Secondary | ICD-10-CM

## 2021-11-23 DIAGNOSIS — E871 Hypo-osmolality and hyponatremia: Secondary | ICD-10-CM | POA: Diagnosis not present

## 2021-11-23 DIAGNOSIS — E039 Hypothyroidism, unspecified: Secondary | ICD-10-CM | POA: Diagnosis present

## 2021-11-23 DIAGNOSIS — Z823 Family history of stroke: Secondary | ICD-10-CM

## 2021-11-23 DIAGNOSIS — R161 Splenomegaly, not elsewhere classified: Secondary | ICD-10-CM | POA: Diagnosis not present

## 2021-11-23 DIAGNOSIS — Z22322 Carrier or suspected carrier of Methicillin resistant Staphylococcus aureus: Secondary | ICD-10-CM

## 2021-11-23 DIAGNOSIS — D72819 Decreased white blood cell count, unspecified: Secondary | ICD-10-CM | POA: Diagnosis present

## 2021-11-23 DIAGNOSIS — N183 Chronic kidney disease, stage 3 unspecified: Secondary | ICD-10-CM

## 2021-11-23 DIAGNOSIS — L89312 Pressure ulcer of right buttock, stage 2: Secondary | ICD-10-CM | POA: Diagnosis present

## 2021-11-23 DIAGNOSIS — D122 Benign neoplasm of ascending colon: Secondary | ICD-10-CM

## 2021-11-23 DIAGNOSIS — A419 Sepsis, unspecified organism: Secondary | ICD-10-CM | POA: Diagnosis not present

## 2021-11-23 DIAGNOSIS — L89159 Pressure ulcer of sacral region, unspecified stage: Secondary | ICD-10-CM | POA: Diagnosis present

## 2021-11-23 DIAGNOSIS — I10 Essential (primary) hypertension: Secondary | ICD-10-CM | POA: Diagnosis not present

## 2021-11-23 DIAGNOSIS — L89302 Pressure ulcer of unspecified buttock, stage 2: Secondary | ICD-10-CM

## 2021-11-23 DIAGNOSIS — E7800 Pure hypercholesterolemia, unspecified: Secondary | ICD-10-CM | POA: Diagnosis present

## 2021-11-23 DIAGNOSIS — N1831 Chronic kidney disease, stage 3a: Secondary | ICD-10-CM | POA: Diagnosis present

## 2021-11-23 DIAGNOSIS — D123 Benign neoplasm of transverse colon: Secondary | ICD-10-CM | POA: Diagnosis not present

## 2021-11-23 DIAGNOSIS — Z9049 Acquired absence of other specified parts of digestive tract: Secondary | ICD-10-CM

## 2021-11-23 DIAGNOSIS — Z888 Allergy status to other drugs, medicaments and biological substances status: Secondary | ICD-10-CM

## 2021-11-23 DIAGNOSIS — Z8614 Personal history of Methicillin resistant Staphylococcus aureus infection: Secondary | ICD-10-CM

## 2021-11-23 DIAGNOSIS — N1832 Chronic kidney disease, stage 3b: Secondary | ICD-10-CM

## 2021-11-23 DIAGNOSIS — R6510 Systemic inflammatory response syndrome (SIRS) of non-infectious origin without acute organ dysfunction: Secondary | ICD-10-CM | POA: Diagnosis not present

## 2021-11-23 DIAGNOSIS — I517 Cardiomegaly: Secondary | ICD-10-CM | POA: Diagnosis not present

## 2021-11-23 DIAGNOSIS — K648 Other hemorrhoids: Secondary | ICD-10-CM | POA: Diagnosis present

## 2021-11-23 DIAGNOSIS — Z7901 Long term (current) use of anticoagulants: Secondary | ICD-10-CM | POA: Diagnosis not present

## 2021-11-23 DIAGNOSIS — Z8249 Family history of ischemic heart disease and other diseases of the circulatory system: Secondary | ICD-10-CM

## 2021-11-23 DIAGNOSIS — J9811 Atelectasis: Secondary | ICD-10-CM | POA: Diagnosis not present

## 2021-11-23 DIAGNOSIS — R000 Tachycardia, unspecified: Secondary | ICD-10-CM | POA: Diagnosis not present

## 2021-11-23 DIAGNOSIS — Z79899 Other long term (current) drug therapy: Secondary | ICD-10-CM

## 2021-11-23 DIAGNOSIS — M7989 Other specified soft tissue disorders: Secondary | ICD-10-CM | POA: Diagnosis not present

## 2021-11-23 DIAGNOSIS — I700 Atherosclerosis of aorta: Secondary | ICD-10-CM | POA: Diagnosis not present

## 2021-11-23 DIAGNOSIS — E1140 Type 2 diabetes mellitus with diabetic neuropathy, unspecified: Secondary | ICD-10-CM | POA: Diagnosis present

## 2021-11-23 DIAGNOSIS — D500 Iron deficiency anemia secondary to blood loss (chronic): Secondary | ICD-10-CM | POA: Diagnosis not present

## 2021-11-23 DIAGNOSIS — K635 Polyp of colon: Secondary | ICD-10-CM | POA: Diagnosis not present

## 2021-11-23 DIAGNOSIS — N400 Enlarged prostate without lower urinary tract symptoms: Secondary | ICD-10-CM | POA: Diagnosis present

## 2021-11-23 DIAGNOSIS — Z79890 Hormone replacement therapy (postmenopausal): Secondary | ICD-10-CM

## 2021-11-23 DIAGNOSIS — Z794 Long term (current) use of insulin: Secondary | ICD-10-CM

## 2021-11-23 LAB — URINALYSIS, ROUTINE W REFLEX MICROSCOPIC
Bacteria, UA: NONE SEEN
Bilirubin Urine: NEGATIVE
Glucose, UA: NEGATIVE mg/dL
Ketones, ur: NEGATIVE mg/dL
Leukocytes,Ua: NEGATIVE
Nitrite: NEGATIVE
Protein, ur: 30 mg/dL — AB
Specific Gravity, Urine: 1.011 (ref 1.005–1.030)
pH: 5 (ref 5.0–8.0)

## 2021-11-23 LAB — RESPIRATORY PANEL BY PCR

## 2021-11-23 LAB — COMPREHENSIVE METABOLIC PANEL
ALT: 16 U/L (ref 0–44)
AST: 25 U/L (ref 15–41)
Albumin: 2.4 g/dL — ABNORMAL LOW (ref 3.5–5.0)
Alkaline Phosphatase: 59 U/L (ref 38–126)
Anion gap: 7 (ref 5–15)
BUN: 19 mg/dL (ref 8–23)
CO2: 22 mmol/L (ref 22–32)
Calcium: 7.6 mg/dL — ABNORMAL LOW (ref 8.9–10.3)
Chloride: 101 mmol/L (ref 98–111)
Creatinine, Ser: 1.56 mg/dL — ABNORMAL HIGH (ref 0.61–1.24)
GFR, Estimated: 47 mL/min — ABNORMAL LOW (ref >60)
Glucose, Bld: 105 mg/dL — ABNORMAL HIGH (ref 70–99)
Potassium: 3.5 mmol/L (ref 3.5–5.1)
Sodium: 130 mmol/L — ABNORMAL LOW (ref 135–145)
Total Bilirubin: 0.8 mg/dL (ref 0.3–1.2)
Total Protein: 6.5 g/dL (ref 6.5–8.1)

## 2021-11-23 LAB — CBG MONITORING, ED
Glucose-Capillary: 46 mg/dL — ABNORMAL LOW (ref 70–99)
Glucose-Capillary: 82 mg/dL (ref 70–99)
Glucose-Capillary: 121 mg/dL — ABNORMAL HIGH (ref 70–99)
Glucose-Capillary: 115 mg/dL — ABNORMAL HIGH (ref 70–99)

## 2021-11-23 LAB — CBC WITH DIFFERENTIAL/PLATELET
Abs Immature Granulocytes: 0.01 10*3/uL (ref 0.00–0.07)
Basophils Absolute: 0 10*3/uL (ref 0.0–0.1)
Basophils Relative: 1 %
Eosinophils Absolute: 0 10*3/uL (ref 0.0–0.5)
Eosinophils Relative: 2 %
HCT: 25.7 % — ABNORMAL LOW (ref 39.0–52.0)
Hemoglobin: 8.4 g/dL — ABNORMAL LOW (ref 13.0–17.0)
Immature Granulocytes: 1 %
Lymphocytes Relative: 22 %
Lymphs Abs: 0.4 10*3/uL — ABNORMAL LOW (ref 0.7–4.0)
MCH: 29.4 pg (ref 26.0–34.0)
MCHC: 32.7 g/dL (ref 30.0–36.0)
MCV: 89.9 fL (ref 80.0–100.0)
Monocytes Absolute: 0.2 10*3/uL (ref 0.1–1.0)
Monocytes Relative: 11 %
Neutro Abs: 1.2 10*3/uL — ABNORMAL LOW (ref 1.7–7.7)
Neutrophils Relative %: 63 %
Platelets: 203 10*3/uL (ref 150–400)
RBC: 2.86 MIL/uL — ABNORMAL LOW (ref 4.22–5.81)
RDW: 14 % (ref 11.5–15.5)
WBC: 1.8 10*3/uL — ABNORMAL LOW (ref 4.0–10.5)
nRBC: 0 % (ref 0.0–0.2)

## 2021-11-23 LAB — LACTIC ACID, PLASMA
Lactic Acid, Venous: 0.8 mmol/L (ref 0.5–1.9)
Lactic Acid, Venous: 0.8 mmol/L (ref 0.5–1.9)

## 2021-11-23 LAB — APTT: aPTT: 83 seconds — ABNORMAL HIGH (ref 24–36)

## 2021-11-23 LAB — RESP PANEL BY RT-PCR (FLU A&B, COVID) ARPGX2
Influenza A by PCR: NEGATIVE
Influenza B by PCR: NEGATIVE
SARS Coronavirus 2 by RT PCR: NEGATIVE

## 2021-11-23 LAB — PROTIME-INR
INR: 3.5 — ABNORMAL HIGH (ref 0.8–1.2)
Prothrombin Time: 35.5 seconds — ABNORMAL HIGH (ref 11.4–15.2)

## 2021-11-23 LAB — FIBRINOGEN: Fibrinogen: 429 mg/dL (ref 210–475)

## 2021-11-23 LAB — GLUCOSE, CAPILLARY: Glucose-Capillary: 133 mg/dL — ABNORMAL HIGH (ref 70–99)

## 2021-11-23 LAB — POC OCCULT BLOOD, ED: Fecal Occult Bld: NEGATIVE

## 2021-11-23 MED ORDER — INSULIN GLARGINE-YFGN 100 UNIT/ML ~~LOC~~ SOLN
15.0000 [IU] | Freq: Every day | SUBCUTANEOUS | Status: DC
  Administered 2021-11-23 – 2021-11-24 (×2): 15 [IU] via SUBCUTANEOUS
  Filled 2021-11-23 (×3): qty 0.15

## 2021-11-23 MED ORDER — ONDANSETRON HCL 4 MG PO TABS: 4.0000 mg | ORAL_TABLET | Freq: Four times a day (QID) | ORAL | Status: DC | PRN

## 2021-11-23 MED ORDER — INSULIN ASPART 100 UNIT/ML IJ SOLN
3.0000 [IU] | Freq: Three times a day (TID) | INTRAMUSCULAR | Status: DC
  Administered 2021-11-23 – 2021-11-27 (×7): 3 [IU] via SUBCUTANEOUS

## 2021-11-23 MED ORDER — PIPERACILLIN-TAZOBACTAM 3.375 G IVPB 30 MIN
3.3750 g | Freq: Once | INTRAVENOUS | Status: AC
  Administered 2021-11-23: 3.375 g via INTRAVENOUS
  Filled 2021-11-23: qty 50

## 2021-11-23 MED ORDER — HYDRALAZINE HCL 50 MG PO TABS: 100.0000 mg | ORAL_TABLET | Freq: Three times a day (TID) | ORAL | Status: DC

## 2021-11-23 MED ORDER — PHYTONADIONE 5 MG PO TABS: 5.0000 mg | ORAL_TABLET | Freq: Once | ORAL | Status: DC

## 2021-11-23 MED ORDER — VANCOMYCIN HCL 1750 MG/350ML IV SOLN
1750.0000 mg | Freq: Once | INTRAVENOUS | Status: AC
  Administered 2021-11-23: 1750 mg via INTRAVENOUS
  Filled 2021-11-23: qty 350

## 2021-11-23 MED ORDER — IOHEXOL 300 MG/ML  SOLN
100.0000 mL | Freq: Once | INTRAMUSCULAR | Status: AC | PRN
  Administered 2021-11-23: 100 mL via INTRAVENOUS

## 2021-11-23 MED ORDER — SODIUM CHLORIDE 0.9 % IV SOLN
1.0000 g | INTRAVENOUS | Status: DC
  Administered 2021-11-23 – 2021-11-27 (×5): 1 g via INTRAVENOUS
  Filled 2021-11-23 (×5): qty 10

## 2021-11-23 MED ORDER — VANCOMYCIN HCL 1250 MG/250ML IV SOLN
1250.0000 mg | INTRAVENOUS | Status: DC
  Administered 2021-11-24: 1250 mg via INTRAVENOUS
  Filled 2021-11-23: qty 250

## 2021-11-23 MED ORDER — EZETIMIBE 10 MG PO TABS
10.0000 mg | ORAL_TABLET | Freq: Every day | ORAL | Status: DC
  Administered 2021-11-23 – 2021-11-27 (×4): 10 mg via ORAL
  Filled 2021-11-23 (×4): qty 1

## 2021-11-23 MED ORDER — MUPIROCIN CALCIUM 2 % EX CREA
TOPICAL_CREAM | Freq: Every day | CUTANEOUS | Status: DC
  Filled 2021-11-23: qty 15

## 2021-11-23 MED ORDER — ACETAMINOPHEN 325 MG PO TABS
650.0000 mg | ORAL_TABLET | Freq: Four times a day (QID) | ORAL | Status: DC | PRN
  Administered 2021-11-23 – 2021-11-24 (×2): 650 mg via ORAL
  Filled 2021-11-23 (×2): qty 2

## 2021-11-23 MED ORDER — PANTOPRAZOLE SODIUM 40 MG PO TBEC
80.0000 mg | DELAYED_RELEASE_TABLET | Freq: Every day | ORAL | Status: DC
  Administered 2021-11-23 – 2021-11-26 (×4): 80 mg via ORAL
  Filled 2021-11-23 (×4): qty 2

## 2021-11-23 MED ORDER — INSULIN PUMP
Freq: Three times a day (TID) | SUBCUTANEOUS | Status: DC
  Filled 2021-11-23: qty 1

## 2021-11-23 MED ORDER — GABAPENTIN 300 MG PO CAPS
300.0000 mg | ORAL_CAPSULE | Freq: Two times a day (BID) | ORAL | Status: DC
  Administered 2021-11-23 – 2021-11-27 (×8): 300 mg via ORAL
  Filled 2021-11-23 (×8): qty 1

## 2021-11-23 MED ORDER — RIVAROXABAN 20 MG PO TABS
20.0000 mg | ORAL_TABLET | Freq: Every day | ORAL | Status: DC
  Administered 2021-11-23: 20 mg via ORAL
  Filled 2021-11-23: qty 2

## 2021-11-23 MED ORDER — ATORVASTATIN CALCIUM 40 MG PO TABS
40.0000 mg | ORAL_TABLET | Freq: Every day | ORAL | Status: DC
  Administered 2021-11-23: 40 mg via ORAL
  Filled 2021-11-23: qty 1

## 2021-11-23 MED ORDER — INSULIN ASPART 100 UNIT/ML IJ SOLN
0.0000 [IU] | INTRAMUSCULAR | Status: DC
  Administered 2021-11-23: 1 [IU] via SUBCUTANEOUS

## 2021-11-23 MED ORDER — FERROUS SULFATE 325 (65 FE) MG PO TABS
325.0000 mg | ORAL_TABLET | Freq: Two times a day (BID) | ORAL | Status: DC
  Administered 2021-11-23 – 2021-11-24 (×3): 325 mg via ORAL
  Filled 2021-11-23 (×3): qty 1

## 2021-11-23 MED ORDER — SODIUM CHLORIDE 0.9 % IV SOLN: INTRAVENOUS | Status: DC

## 2021-11-23 MED ORDER — LEVOTHYROXINE SODIUM 75 MCG PO TABS
75.0000 ug | ORAL_TABLET | Freq: Every day | ORAL | Status: DC
  Administered 2021-11-23 – 2021-11-27 (×5): 75 ug via ORAL
  Filled 2021-11-23 (×5): qty 1

## 2021-11-23 MED ORDER — DIPHENHYDRAMINE HCL 25 MG PO CAPS
25.0000 mg | ORAL_CAPSULE | Freq: Every day | ORAL | Status: DC
  Administered 2021-11-23 – 2021-11-26 (×4): 25 mg via ORAL
  Filled 2021-11-23 (×4): qty 1

## 2021-11-23 MED ORDER — INSULIN ASPART 100 UNIT/ML IJ SOLN
0.0000 [IU] | Freq: Three times a day (TID) | INTRAMUSCULAR | Status: DC
  Administered 2021-11-24: 5 [IU] via SUBCUTANEOUS
  Administered 2021-11-24: 1 [IU] via SUBCUTANEOUS
  Administered 2021-11-24: 5 [IU] via SUBCUTANEOUS
  Administered 2021-11-25: 2 [IU] via SUBCUTANEOUS
  Administered 2021-11-26: 5 [IU] via SUBCUTANEOUS
  Administered 2021-11-26: 3 [IU] via SUBCUTANEOUS
  Administered 2021-11-27: 5 [IU] via SUBCUTANEOUS
  Administered 2021-11-27: 6 [IU] via SUBCUTANEOUS

## 2021-11-23 MED ORDER — SIMVASTATIN 20 MG PO TABS
40.0000 mg | ORAL_TABLET | Freq: Every day | ORAL | Status: DC
  Administered 2021-11-24 – 2021-11-26 (×3): 40 mg via ORAL
  Filled 2021-11-23 (×4): qty 2

## 2021-11-23 MED ORDER — ONDANSETRON HCL 4 MG/2ML IJ SOLN: 4.0000 mg | Freq: Four times a day (QID) | INTRAMUSCULAR | Status: DC | PRN

## 2021-11-23 NOTE — Progress Notes (Addendum)
Inpatient Diabetes Program Recommendations  AACE/ADA: New Consensus Statement on Inpatient Glycemic Control (2015)  Target Ranges:  Prepandial:   less than 140 mg/dL      Peak postprandial:   less than 180 mg/dL (1-2 hours)      Critically ill patients:  140 - 180 mg/dL   Lab Results  Component Value Date   GLUCAP 82 11/23/2021   HGBA1C 6.0 (H) 10/07/2021    Review of Glycemic Control  Diabetes history: type 1 Outpatient Diabetes medications: Insulin pump Current orders for Inpatient glycemic control: insulin pump  Inpatient Diabetes Program Recommendations:   Spoke with patient at the bedside. Blood sugar was 46 mg/dl at noon. Was treated for hypoglycemia. Patient states that he did not eat enough this morning. States that he does not have an appetite and does not like the hospital food. States that he has had low blood sugars at home.   He was an inpatient and was seen by our inpatient team in November, 2022. States that his settings on his insulin pump have not changed since then.   Recommend stopping insulin pump while in the hospital and starting SQ insulin while he is here. Patient agrees to this change if needed.   Recommend starting with Semglee 15 units at HS (start now),  Novolog SENSITIVE correction scale every 4 hours (if not eating well). When eating, may need Novolog 3 units TID as meal coverage if eating at least 50% of meal. Titrate dosages as needed.   Will speak with Dr. Broadus John in regards to these changes.   Insulin: Lispro (Humalog) Pump Brand/Model: Medtronic  Basal Total Daily Basal Insulin: 32.1  Basal Time: 00:00 (1) Basal Rate (Units/Hr): 1.4  Basal Time: 04:00 (2) Basal Rate (Units/Hr): 1.5  Basal Time: 07:00 (3) Basal Rate (Units/Hr): 1.4  Basal Time: 18:00 (4) Basal Rate (Units/Hr): 1.1   Insulin:Carb ICR Time: 00:00 (1) ICR : 9.5 ICR Time: 06:00 (2) ICR : 7 ICR Time: 11:00 (3) ICR: 8  ICR Time: 18:00 (4) ICR: 7.5  ICR Time: 21:00 (5) ICR: 8.5    Sensitivity ISF Time: 00:00 (1) ISF: 20  Target BG Time: 00:00 (1) Target BG (mg/dL): 140  BG Time: 07:30 (2) Target BG (mg/dL): 130  BG Time: 22:00 (3) Target BG (mg/dL): 140   Harvel Ricks RN BSN CDE Diabetes Coordinator Pager: [phone]  8am-5pm

## 2021-11-23 NOTE — ED Notes (Signed)
Hemacult placed in mini lab

## 2021-11-23 NOTE — Progress Notes (Signed)
Patient seen and examined, admitted earlier this morning by Dr. Alcario Drought, please see his H&P for details briefly Mr. Jeremiah Beasley is a 75/M with history of paroxysmal atrial fibrillation on Xarelto, insulin-dependent diabetes, CKD stage IIIa, iron deficiency anemia, with poor functional status and progressive decline since December 2021.  He was hospitalized in September with sacral decubitus wounds, cellulitis requiring antibiotics and inpatient admission. -Continue to have failure to thrive, minimally ambulatory at baseline. -Presented to the ED 1/6 with fever, decreased appetite, per EMS temp was 104.  Also has a low-grade cough.  In the ED temp was 100.7, WBC was 1.8, hemoglobin 8.4 down from 10.4 in December  Fever, SIRS -Has superficial sacral decubitus wounds and mild cellulitis on CT, physical exam not very impressive -Also has a mild cough, check influenza COVID and respiratory virus panel -Continue empiric Rocephin and ceftriaxone for now, follow-up blood cultures -Gentle IV fluids today  History of iron deficiency anemia -Diagnosed 2 months ago, Hemoccult negative -Remains on Xarelto, had an EGD in November which was unrevealing, colonoscopy was discussed then but patient was too weak to complete the prep, deferred for now, follow-up with gastroenterology -Repeat anemia panel, will consider IV iron  Paroxysmal atrial fibrillation -Continue Xarelto, monitor hemoglobin  Type 1 diabetes mellitus -Continue insulin pump  Hypertension -BP stable, hydralazine on hold  Sacral decubitus wounds -WOC consult, frequent repositioning  Moderate protein calorie malnutrition  CKD 3a -Creatinine at baseline  DVT prophylaxis: Xarelto CODE STATUS, discussed with patient, he wishes for CPR, would not want intubation  Domenic Polite, MD

## 2021-11-23 NOTE — ED Provider Notes (Addendum)
[facility] EMERGENCY DEPARTMENT Provider Note   CSN: [id number] Arrival date & time: 11/23/21  0044     History  Chief Complaint  Patient presents with   Fever    Jeremiah Beasley. is a 76 y.o. male with PMH paroxysmal A. fib on Xarelto, T2DM, CKD 3, BPH, hypothyroidism who presents emergency department for evaluation of fever.  The patient had a recent admission in November 2022 for hyponatremia and weakness and he was ultimately discharged home with home health.  Patient has known pressure ulcers and he is following with outpatient wound care who instructed the patient to return to the emergency department if he has a fever.  Patient was found to have a fever of 101.0 at home by his wife and come to the emergency department for evaluation.  He endorses buttock pain worsening over the last 3 days.  He arrives febrile to 100.7 despite receiving Tylenol 3 hours prior to arrival.  Denies chest pain, shortness of breath, Donnell pain, nausea, vomiting or other systemic symptoms.   Fever     Home Medications Prior to Admission medications   Medication Sig Start Date End Date Taking? Authorizing Provider  acetaminophen (TYLENOL) 325 MG tablet Take 2 tablets (650 mg total) by mouth every 6 (six) hours as needed for mild pain, fever or headache. 10/10/21   Debbe Odea, MD  diphenhydrAMINE (BENADRYL) 25 mg capsule Take 25 mg by mouth at bedtime.    [provider]  ezetimibe (ZETIA) 10 MG tablet Take 1 tablet (10 mg total) by mouth daily. 06/08/20 11/09/21  Park Liter, MD  gabapentin (NEURONTIN) 300 MG capsule Take 1 capsule (300 mg total) by mouth 2 (two) times daily. 09/24/21   Narda Amber K, DO  hydrALAZINE (APRESOLINE) 100 MG tablet Take 100 mg by mouth 3 (three) times daily. 01/25/21   [provider]  Insulin Human (INSULIN PUMP) SOLN Inject 1 each into the skin continuous.    [provider]  levothyroxine (SYNTHROID, LEVOTHROID) 75  MCG tablet Take 75 mcg by mouth daily before breakfast. 05/22/16   [provider]  Multiple Vitamins-Minerals (CENTRUM SILVER ADULT 50+ PO) Take 1 tablet by mouth daily with breakfast.    [provider]  omeprazole (PRILOSEC) 40 MG capsule Take 40 mg by mouth daily before supper. 02/16/21   [provider]  rivaroxaban (XARELTO) 20 MG TABS tablet Take 20 mg by mouth daily with supper. 10/08/16   [provider]  simvastatin (ZOCOR) 80 MG tablet Take 80 mg by mouth daily. 03/12/19   [provider]      Allergies    Quinapril, Methaqualone, and Tape    Review of Systems   Review of Systems  Constitutional:  Positive for fever.  Musculoskeletal:  Positive for back pain.   Physical Exam Updated Vital Signs BP 126/78    Pulse 74    Temp (!) 100.7 F (38.2 C) (Rectal)    Resp 20    SpO2 97%  Physical Exam Vitals and nursing note reviewed.  Constitutional:      General: He is not in acute distress.    Appearance: He is well-developed.  HENT:     Head: Normocephalic and atraumatic.  Eyes:     Conjunctiva/sclera: Conjunctivae normal.  Cardiovascular:     Rate and Rhythm: Normal rate and regular rhythm.     Heart sounds: No murmur heard. Pulmonary:     Effort: Pulmonary effort is normal. No respiratory distress.  Breath sounds: Normal breath sounds.  Abdominal:     Palpations: Abdomen is soft.     Tenderness: There is no abdominal tenderness.  Musculoskeletal:        General: No swelling.     Cervical back: Neck supple.  Skin:    General: Skin is warm and dry.     Capillary Refill: Capillary refill takes less than 2 seconds.     Findings: Lesion (Multiple stage II pressure ulcers see picture below) present.  Neurological:     Mental Status: He is alert.  Psychiatric:        Mood and Affect: Mood normal.         ED Results / Procedures / Treatments   Labs (all labs ordered are listed, but only abnormal results are  displayed) Labs Reviewed  CBC WITH DIFFERENTIAL/PLATELET - Abnormal; Notable for the following components:      Result Value   WBC 1.8 (*)    RBC 2.86 (*)    Hemoglobin 8.4 (*)    HCT 25.7 (*)    Neutro Abs 1.2 (*)    Lymphs Abs 0.4 (*)    All other components within normal limits  PROTIME-INR - Abnormal; Notable for the following components:   Prothrombin Time 35.5 (*)    INR 3.5 (*)    All other components within normal limits  APTT - Abnormal; Notable for the following components:   aPTT 83 (*)    All other components within normal limits  CULTURE, BLOOD (ROUTINE X 2)  CULTURE, BLOOD (ROUTINE X 2)  URINE CULTURE  LACTIC ACID, PLASMA  LACTIC ACID, PLASMA  COMPREHENSIVE METABOLIC PANEL  URINALYSIS, ROUTINE W REFLEX MICROSCOPIC    EKG None  Radiology DG Chest Port 1 View  Result Date: 11/23/2021 CLINICAL DATA:  Fever and decreased appetite. EXAM: PORTABLE CHEST 1 VIEW COMPARISON:  October 06, 2021 FINDINGS: Mild atelectasis is seen within the bilateral lung bases. There is no evidence of acute infiltrate, pleural effusion or pneumothorax. The cardiac silhouette is mildly enlarged and unchanged in size. The visualized skeletal structures are unremarkable. IMPRESSION: Stable cardiomegaly with mild bibasilar atelectasis. Electronically Signed   By: Virgina Norfolk M.D.   On: 11/23/2021 01:10    Procedures .1-3 Lead EKG Interpretation Performed by: Teressa Lower, MD Authorized by: Teressa Lower, MD     Interpretation: normal     ECG rate assessment: normal     Rhythm: sinus tachycardia     Ectopy: PAC     Conduction: normal   .Critical Care Performed by: Teressa Lower, MD Authorized by: Teressa Lower, MD   Critical care provider statement:    Critical care was necessary to treat or prevent imminent or life-threatening deterioration of the following conditions: high risk infection requiring multiple antibiotics.    Medications Ordered in ED Medications - No  data to display  ED Course/ Medical Decision Making/ A&P                           Medical Decision Making  Patient seen emergency department for evaluation of fever and buttock pain.  Physical exam reveals multiple pressure ulcers with pictures above and surrounding erythema.  Physical exam also showing swelling and tenderness to the knuckle on the third digit on the right hand.  Laboratory evaluation with severe leukopenia to 1.8, hemoglobin 8.4 which is a significant drop from 2 weeks ago at 10.4.  INR is elevated to 3.5 which is  at this point unexplained.  May be secondary to his Xarelto.  Chemistry with hyponatremia to 130, creatinine elevated to 1.56 which is patient's baseline.  Urinalysis unremarkable.  X-ray hand with nondisplaced proximal phalanx fracture on the third digit.  Patient placed in a splint.  CT abdomen pelvis obtained that shows cellulitis but no evidence of deep space infection.  As the patient is leukopenic with a fever, patient meeting SIRS criteria and broad-spectrum antibiotics initiated.  Chest x-ray unremarkable.  I independently evaluated all of the above scans and my evaluation is consistent with what is listed in the radiology reads.  Patient then admitted for observation in the setting of high risk cellulitis.        Final Clinical Impression(s) / ED Diagnoses Final diagnoses:  None    Rx / DC Orders ED Discharge Orders     None         Dardan Shelton, Debe Coder, MD 11/23/21 1829    Teressa Lower, MD 11/23/21 [phone]

## 2021-11-23 NOTE — Consult Note (Addendum)
[facility] Nurse Consult Note: Reason for Consult: Consult requested for buttock wounds.  Performed remotely after review of photos and progress notes in the EMR.  CT scan indicates, "1. Small superficial sacral also, without evidence of associated fluid collection, abscess or acute osteomyelitis. 2. Mild cellulitis along the posteromedial aspect of the gluteal region on the left." Wound type: Inner gluteal cleft with full thickness linear fissure,  red and moist.  This is not a pressure injury, it is related to moisture. Left buttock with healing Stage 3 pressure injury; red and moist, dark red. Right lower buttock with Stage 2 pressure injury; red and dry. Pressure Injury POA: Yes Dressing procedure/placement/frequency: Topical treatment provided for bedside nurses to perform as follows to promote healing: Apply Bactroban to buttocks and gluteal cleft wounds Q day, then cover with foam dressing. (Change foam dressings Q 3 days or PRN soiling.) Please re-consult if further assistance is needed.  Thank-you,  Julien Girt MSN, [place], [place], [place], [place]

## 2021-11-23 NOTE — ED Triage Notes (Signed)
Pt BIB [facility] from home, family reports pt has a decub ulcer to his buttock that appears infected, pt spiked a fever today, and has had decreased appetite and appeared altered. Pt alert to place, self and situation. EMS: temp 104, given tylenol by family at 11pm.

## 2021-11-23 NOTE — Progress Notes (Signed)
Pharmacy Antibiotic Note  Jeremiah Beasley. is a 76 y.o. male admitted on 11/23/2021 with cellulitis.  Pharmacy has been consulted for vancomycin dosing. WBC low. SCr is elevated. CrCl ~ 42 mL/min   Of note, patient has received loading dose of vancomycin   Plan: -Vancomycin 1250 mg IV Q 24 hrs. Goal AUC 400-550. Expected AUC: 530 SCr used: 1.56 -F/u if maintenance Zosyn is necessary  -Monitor CBC, renal fx, cultures and clinical progress      Temp (24hrs), Avg:100.7 F (38.2 C), Min:100.7 F (38.2 C), Max:100.7 F (38.2 C)  Recent Labs  Lab 11/23/21 0051 11/23/21 0155 11/23/21 0243  WBC 1.8*  --   --   CREATININE 1.56*  --   --   LATICACIDVEN  --  0.8 0.8    Estimated Creatinine Clearance: 42.9 mL/min (A) (by C-G formula based on SCr of 1.56 mg/dL (H)).    Allergies  Allergen Reactions   Quinapril Shortness Of Breath, Swelling and Other (See Comments)    TONGUE SWELLLING   Methaqualone Other (See Comments)    Unknown to patient   Tape Rash and Other (See Comments)    Skin is sensitive!!    Antimicrobials this admission: Vancomycin 1/6 >>  Zosyn 1/6 >>   Dose adjustments this admission:   Microbiology results: 1/6 BCx:  1/6 UCx:     Thank you for allowing pharmacy to be a part of this patients care.  Albertina Parr, PharmD., BCPS, BCCCP Clinical Pharmacist Please refer to [facility] for unit-specific pharmacist

## 2021-11-23 NOTE — ED Notes (Signed)
Pt took insulin pump off and it was put into his bag

## 2021-11-23 NOTE — H&P (Signed)
History and Physical    Jeremiah Beasley. BWG:[id number] DOB: [birth year] DOA: 11/23/2021  PCP: [name], MD  Patient coming from: Home  I have personally briefly reviewed patient's old medical records in [facility]  Chief Complaint: Fever  HPI: Jeremiah Beasley. is a 76 y.o. male with medical history significant of PAF on Xarelto, IDDM, CKD 3.  Pt with poor functional baseline and progressive decline over past year following Cholecystitis in Dec 2021.  This followed by MRSA cellulitis of leg wound in Sept.  Development of sacral and buttocks decubitus ulcers which became infected with cellulitis in Nov requiring admission to hospital.  Despite initial successful treatment of cellulitis, pt has remained weak and mostly non-ambulatory with poor PO intake so wound healing has been an ongoing issue and the decubitus ulcers persist.  He presents to the ED today after running fever at home.  Also has increased pain to buttocks area.  Fever improved with tylenol.  Symptoms constant, persistent.  No N/V.   ED Course: Tm 100.7, WBC 1.8k, HGB 8.4 down from 10.4 on 12/23.  WBC 1.8k is down from 2.8k on 12/23  Hemoccult neg today.  Regarding anemia: Iron def anemia diagnosed in Nov, required transfusion. Had EGD which showed no bleeding site. Pt discharged home before colonoscopy could be performed at that time.   Review of Systems: As per HPI, otherwise all review of systems negative.  Past Medical History:  Diagnosis Date   Atrial fibrillation (HCC)    Chronic pain of right knee 04/11/2020   Diabetes mellitus without complication ([place])    Essential hypertension 05/15/2015   Hyperlipidemia    Hypertension    MRSA (methicillin resistant staph aureus) culture positive    Paroxysmal atrial fibrillation (HCC) 05/15/2015   Pneumonia 10/20/2020   Pure hypercholesterolemia 05/15/2015   S/P right knee arthroscopy 04/25/2020   Type 2 diabetes mellitus without complication  ([place]) 17/79/3903    Past Surgical History:  Procedure Laterality Date   BIOPSY  10/10/2021   Procedure: BIOPSY;  Surgeon: Otis Brace, MD;  Location: [facility];  Service: Gastroenterology;;   ESOPHAGOGASTRODUODENOSCOPY (EGD) WITH PROPOFOL N/A 10/10/2021   Procedure: ESOPHAGOGASTRODUODENOSCOPY (EGD) WITH PROPOFOL;  Surgeon: Otis Brace, MD;  Location: [facility];  Service: Gastroenterology;  Laterality: N/A;   gall bladder removed   10/20/2020   Iron infusion  05/18/2021   x2   NO PAST SURGERIES       reports that he has never smoked. He uses smokeless tobacco. He reports that he does not drink alcohol and does not use drugs.  Allergies  Allergen Reactions   Quinapril Shortness Of Breath, Swelling and Other (See Comments)    TONGUE SWELLLING   Methaqualone Other (See Comments)    Unknown to patient   Tape Rash and Other (See Comments)    Skin is sensitive!!    Family History  Problem Relation Age of Onset   Heart attack Father    Hyperlipidemia Father    Stroke Father      Prior to Admission medications   Medication Sig Start Date End Date Taking? Authorizing Provider  acetaminophen (TYLENOL) 325 MG tablet Take 2 tablets (650 mg total) by mouth every 6 (six) hours as needed for mild pain, fever or headache. 10/10/21  Yes Debbe Odea, MD  diphenhydrAMINE (BENADRYL) 25 mg capsule Take 25 mg by mouth at bedtime.   Yes [provider]  ezetimibe (ZETIA) 10 MG tablet Take 1 tablet (10 mg total) by  mouth daily. 06/08/20 11/23/21 Yes Park Liter, MD  gabapentin (NEURONTIN) 300 MG capsule Take 1 capsule (300 mg total) by mouth 2 (two) times daily. 09/24/21  Yes Patel, Donika K, DO  hydrALAZINE (APRESOLINE) 100 MG tablet Take 100 mg by mouth 3 (three) times daily. 01/25/21  Yes [provider]  Insulin Human (INSULIN PUMP) SOLN Inject 1 each into the skin continuous.   Yes [provider]  levothyroxine (SYNTHROID, LEVOTHROID) 75 MCG  tablet Take 75 mcg by mouth daily before breakfast. 05/22/16  Yes [provider]  Multiple Vitamins-Minerals (CENTRUM SILVER ADULT 50+ PO) Take 1 tablet by mouth daily with breakfast.   Yes [provider]  omeprazole (PRILOSEC) 40 MG capsule Take 40 mg by mouth daily before supper. 02/16/21  Yes [provider]  rivaroxaban (XARELTO) 20 MG TABS tablet Take 20 mg by mouth daily with supper. 10/08/16  Yes [provider]  simvastatin (ZOCOR) 80 MG tablet Take 80 mg by mouth daily. 03/12/19  Yes [provider]    Physical Exam: Vitals:   11/23/21 0330 11/23/21 0400 11/23/21 0430 11/23/21 0445  BP: (!) 162/70 130/81 125/68 121/71  Pulse: 63 62 (!) 56 (!) 57  Resp: 20 12 14 13   Temp:      TempSrc:      SpO2: 96% 97% 96% 94%    Constitutional: NAD, calm, comfortable Eyes: PERRL, lids and conjunctivae normal ENMT: Mucous membranes are moist. Posterior pharynx clear of any exudate or lesions.Normal dentition.  Neck: normal, supple, no masses, no thyromegaly Respiratory: clear to auscultation bilaterally, no wheezing, no crackles. Normal respiratory effort. No accessory muscle use.  Cardiovascular: Regular rate and rhythm, no murmurs / rubs / gallops. No extremity edema. 2+ pedal pulses. No carotid bruits.  Abdomen: no tenderness, no masses palpated. No hepatosplenomegaly. Bowel sounds positive.  Musculoskeletal: no clubbing / cyanosis. No joint deformity upper and lower extremities. Good ROM, no contractures. Normal muscle tone.  Skin:       Neurologic: CN 2-12 grossly intact. Sensation intact, DTR normal. Strength 5/5 in all 4.  Psychiatric: Normal judgment and insight. Alert and oriented x 3. Normal mood.    Labs on Admission: I have personally reviewed following labs and imaging studies  CBC: Recent Labs  Lab 11/23/21 0051  WBC 1.8*  NEUTROABS 1.2*  HGB 8.4*  HCT 25.7*  MCV 89.9  PLT 782   Basic Metabolic Panel: Recent Labs  Lab  11/23/21 0051  NA 130*  K 3.5  CL 101  CO2 22  GLUCOSE 105*  BUN 19  CREATININE 1.56*  CALCIUM 7.6*   GFR: Estimated Creatinine Clearance: 42.9 mL/min (A) (by C-G formula based on SCr of 1.56 mg/dL (H)). Liver Function Tests: Recent Labs  Lab 11/23/21 0051  AST 25  ALT 16  ALKPHOS 59  BILITOT 0.8  PROT 6.5  ALBUMIN 2.4*   No results for input(s): LIPASE, AMYLASE in the last 168 hours. No results for input(s): AMMONIA in the last 168 hours. Coagulation Profile: Recent Labs  Lab 11/23/21 0051  INR 3.5*   Cardiac Enzymes: No results for input(s): CKTOTAL, CKMB, CKMBINDEX, TROPONINI in the last 168 hours. BNP (last 3 results) No results for input(s): PROBNP in the last 8760 hours. HbA1C: No results for input(s): HGBA1C in the last 72 hours. CBG: No results for input(s): GLUCAP in the last 168 hours. Lipid Profile: No results for input(s): CHOL, HDL, LDLCALC, TRIG, CHOLHDL, LDLDIRECT in the last 72 hours. Thyroid Function  Tests: No results for input(s): TSH, T4TOTAL, FREET4, T3FREE, THYROIDAB in the last 72 hours. Anemia Panel: No results for input(s): VITAMINB12, FOLATE, FERRITIN, TIBC, IRON, RETICCTPCT in the last 72 hours. Urine analysis:    Component Value Date/Time   COLORURINE YELLOW 11/23/2021 0051   APPEARANCEUR CLEAR 11/23/2021 0051   LABSPEC 1.011 11/23/2021 0051   PHURINE 5.0 11/23/2021 0051   GLUCOSEU NEGATIVE 11/23/2021 0051   HGBUR SMALL (A) 11/23/2021 0051   BILIRUBINUR NEGATIVE 11/23/2021 0051   KETONESUR NEGATIVE 11/23/2021 0051   PROTEINUR 30 (A) 11/23/2021 0051   NITRITE NEGATIVE 11/23/2021 0051   LEUKOCYTESUR NEGATIVE 11/23/2021 0051    Radiological Exams on Admission: CT ABDOMEN PELVIS W CONTRAST  Result Date: 11/23/2021 CLINICAL DATA:  Sacral wound infection. EXAM: CT ABDOMEN AND PELVIS WITH CONTRAST TECHNIQUE: Multidetector CT imaging of the abdomen and pelvis was performed using the standard protocol following bolus administration of  intravenous contrast. CONTRAST:  147mL OMNIPAQUE IOHEXOL 300 MG/ML  SOLN COMPARISON:  August 20, 2021 FINDINGS: Lower chest: Moderate severity atelectasis is seen within the posterior aspect of the bilateral lung bases. There is a very small right pleural effusion. Hepatobiliary: No focal liver abnormality is seen. Status post cholecystectomy. No biliary dilatation. Pancreas: Unremarkable. No pancreatic ductal dilatation or surrounding inflammatory changes. Spleen: A 3.3 cm x 2.1 cm area of low attenuation is seen along the anterior tip of an enlarged spleen. Adrenals/Urinary Tract: Adrenal glands are unremarkable. Kidneys are normal in size, without renal calculi or hydronephrosis. A 1.6 cm diameter cystic appearing area is seen along the upper pole of the left kidney. Urinary bladder is moderately distended. Stomach/Bowel: Stomach is within normal limits. Appendix appears normal. No evidence of bowel wall thickening, distention, or inflammatory changes. Vascular/Lymphatic: Aortic atherosclerosis. No enlarged abdominal or pelvic lymph nodes. Reproductive: Prostate is unremarkable. Other: No abdominal wall hernia or abnormality. No abdominopelvic ascites. Musculoskeletal: A small superficial soft tissue defect is seen at the level of the lower sacrum, along the midline. A mild amount of associated soft tissue thickening is seen. Mild subcutaneous inflammatory fat stranding is seen along the posteromedial aspect of the gluteal region on the left. No acute osseous abnormality is identified. IMPRESSION: 1. Small superficial sacral also, without evidence of associated fluid collection, abscess or acute osteomyelitis. 2. Mild cellulitis along the posteromedial aspect of the gluteal region on the left. 3. Moderate severity bibasilar atelectasis with a very small right pleural effusion. 4. Splenomegaly with additional findings that may represent sequelae associated with a small splenic infarct. 5. Aortic atherosclerosis.  Aortic Atherosclerosis (ICD10-I70.0). Electronically Signed   By: Virgina Norfolk M.D.   On: 11/23/2021 03:14   DG Chest Port 1 View  Result Date: 11/23/2021 CLINICAL DATA:  Fever and decreased appetite. EXAM: PORTABLE CHEST 1 VIEW COMPARISON:  October 06, 2021 FINDINGS: Mild atelectasis is seen within the bilateral lung bases. There is no evidence of acute infiltrate, pleural effusion or pneumothorax. The cardiac silhouette is mildly enlarged and unchanged in size. The visualized skeletal structures are unremarkable. IMPRESSION: Stable cardiomegaly with mild bibasilar atelectasis. Electronically Signed   By: Virgina Norfolk M.D.   On: 11/23/2021 01:10   DG Hand Complete Right  Result Date: 11/23/2021 CLINICAL DATA:  Fall, concern for fracture. EXAM: RIGHT HAND - COMPLETE 3+ VIEW COMPARISON:  None. FINDINGS: There is a nondisplaced fracture at the base of the proximal phalanx of the third digit. The remaining bony structures appear intact. There is no dislocation. Joint space narrowing and osteophyte formation  is noted at the interphalangeal joints and first carpometacarpal joint. Extensive vascular calcifications are present. There is soft tissue swelling over the third metacarpal phalangeal joint. IMPRESSION: Nondisplaced fracture at the base of the proximal phalanx of the third digit. Electronically Signed   By: Brett Fairy M.D.   On: 11/23/2021 03:28    EKG: Independently reviewed.  Assessment/Plan Principal Problem:   Cellulitis of buttock Active Problems:   Essential (primary) hypertension   Paroxysmal atrial fibrillation (HCC)   Type 1 diabetes with stage 3 chronic kidney disease moderate GFR 30-59 (HCC)   Hyponatremia   Generalized weakness   MRSA (methicillin resistant staph aureus) culture positive   Anemia   Severe protein-calorie malnutrition ([place])   Decubitus ulcers   Sepsis ([place])    Sepsis due to cellulitis of buttock - in setting of decubitus ulcers As with Nov  admission: has leukopenia Empiric rocephin + vanc given h/o MRSA cellulitis Wound care consult Tylenol PRN fever Repeat CBC in AM BCx pending Mild cellulitis according to CT scan, dont think theres anything surgical at this time. Anemia - Iron def anemia diagnosed in Nov Hemoccult today is negative Wife is wondering about the colonoscopy recd during last admit: may want to touch base with GI about this, but I did warn her that it would be unlikely they would do colonoscopy in hospital for pt without active GIB Follow daily CBCs while here Will start PO iron replacement PAF - Cont Xarelto for now (no evidence of active bleed) HTN - Hold hydralazine for the moment in setting of sepsis DM - Cont insulin pump CBG checks AC/HS Hyponatremia - Probably dehydration due to poor PO intake (as it was in Nov). NS at 100 Malnutrition - Nutrition consult for wound healing Deconditioning - PT/OT CKD 3 - Creat today seems to be near baseline INR of 3.5 - PharmD thinks just due to [place].  DVT prophylaxis: Xarelto Code Status: Full Family Communication: Wife at bedside Disposition Plan: TBD pending improvement of cellulitis, and recs of PT/OT Consults called: None Admission status: Place in obs     Nikoletta Varma, [place] [facility]  How to contact the [facility] Attending or Consulting provider [place] or covering provider during after hours [place], for this patient?  Check the care team in [facility] and look for a) attending/consulting [facility] provider listed and b) the [facility] team listed Log into [email or web address]  Amion Physician Scheduling and messaging for groups and whole hospitals  On call and physician scheduling software for group practices, residents, hospitalists and other medical providers for call, clinic, rotation and shift schedules. On[employer] is a hospital-wide system for scheduling doctors and paging doctors on call. EasyPlot is for scientific plotting and data analysis.   [email or web address]  and use [place]'s universal password to access. If you do not have the password, please contact the hospital operator.  Locate the [facility] provider you are looking for under [facility] and page to a number that you can be directly reached. If you still have difficulty reaching the provider, please page the [facility] (Director on Call) for the Hospitalists listed on amion for assistance.  11/23/2021, 5:48 AM

## 2021-11-23 NOTE — Evaluation (Signed)
Occupational Therapy Evaluation Patient Details Name: Jeremiah Beasley. MRN: [id number] DOB: 31-Jul-1946 Today's Date: 11/23/2021   History of Present Illness Jeremiah Beasley. is a 76 y.o. male with medical history significant of PAF on Xarelto, IDDM, CKD 3  Pt with poor functional baseline and progressive decline over past year following Cholecystitis in Dec 2021.  This followed by MRSA cellulitis of leg wound in Sept.  Development of sacral and buttocks decubitus ulcers which became infected with cellulitis in Nov requiring admission to hospital. Has remained weak and mostly non-ambulatory.   Clinical Impression   This 76 yo male admitted with above presents to acute OT with PLOF of needing A from his wife for B/D/T (each day more or less dependent on how he was feeling). He also needed A for ambulation with sometimes only being able to use a W/C because he felt too weak to use RW. He currently is setup-Max A for basic ADLs. He will continue to benefit from acute OT with follow up [place].      Recommendations for follow up therapy are one component of a multi-disciplinary discharge planning process, led by the attending physician.  Recommendations may be updated based on patient status, additional functional criteria and insurance authorization.   Follow Up Recommendations  Home health OT    Assistance Recommended at Discharge Frequent or constant Supervision/Assistance  Patient can return home with the following A little help with walking and/or transfers;A little help with bathing/dressing/bathroom    Functional Status Assessment  Patient has had a recent decline in their functional status and demonstrates the ability to make significant improvements in function in a reasonable and predictable amount of time.  Equipment Recommendations  None recommended by OT       Precautions / Restrictions Precautions Precautions: Fall Restrictions Weight Bearing Restrictions: No      Mobility  Bed Mobility Overal bed mobility: Needs Assistance Bed Mobility: Supine to Sit;Sit to Supine     Supine to sit: Mod assist Sit to supine: Min assist   General bed mobility comments: Required assist for trunk and LE Assist    Transfers Overall transfer level: Needs assistance Equipment used: Rolling walker (2 wheels) Transfers: Sit to/from Stand Sit to Stand: Min assist;+2 safety/equipment           General transfer comment: Min A for lift assist to stand from stretcher      Balance Overall balance assessment: Needs assistance Sitting-balance support: No upper extremity supported;Feet supported Sitting balance-Leahy Scale: Fair     Standing balance support: Bilateral upper extremity supported Standing balance-Leahy Scale: Poor Standing balance comment: Reliant on BUE support                           ADL either performed or assessed with clinical judgement   ADL Overall ADL's : Needs assistance/impaired Eating/Feeding: Independent;Sitting   Grooming: Set up;Sitting   Upper Body Bathing: Minimal assistance;Sitting   Lower Body Bathing: Moderate assistance Lower Body Bathing Details (indicate cue type and reason): min A sit<>stand Upper Body Dressing : Set up;Sitting   Lower Body Dressing: Maximal assistance Lower Body Dressing Details (indicate cue type and reason): min A sit<>stand Toilet Transfer: Minimal assistance;Ambulation;Rolling walker (2 wheels) Toilet Transfer Details (indicate cue type and reason): simulated stretcher>wall in front of him>back to stretcher Toileting- Clothing Manipulation and Hygiene: Total assistance Toileting - Clothing Manipulation Details (indicate cue type and reason): min A sit<>stand  Vision Patient Visual Report: No change from baseline              Pertinent Vitals/Pain Pain Assessment: Faces Faces Pain Scale: Hurts little more Pain Location: buttock Pain Descriptors / Indicators:  Guarding;Grimacing Pain Intervention(s): Limited activity within patient's tolerance;Monitored during session;Repositioned     Hand Dominance Right   Extremity/Trunk Assessment Upper Extremity Assessment Upper Extremity Assessment: Overall WFL for tasks assessed   Lower Extremity Assessment Lower Extremity Assessment: Generalized weakness (increased swelling in bilateral feet)   Cervical / Trunk Assessment Cervical / Trunk Assessment: Normal   Communication Communication Communication: No difficulties   Cognition Arousal/Alertness: Awake/alert Behavior During Therapy: WFL for tasks assessed/performed Overall Cognitive Status: Within Functional Limits for tasks assessed                                       General Comments  Pt adamant about returning home at d/c            Home Living Family/patient expects to be discharged to:: Private residence Living Arrangements: Spouse/significant other Available Help at Discharge: Family;Available 24 hours/day Type of Home: House Home Access: Stairs to enter [employer] of Steps: 1+1+1 Entrance Stairs-Rails: None Home Layout: Two level;Able to live on main level with bedroom/bathroom     Bathroom Shower/Tub: Occupational psychologist: Handicapped height Bathroom Accessibility: Yes How Accessible: Accessible via walker Home Equipment: [place] (2 wheels);Cane - single point;Shower seat;Wheelchair - manual          Prior Functioning/Environment Prior Level of Function : Needs assist             Mobility Comments: Uses RW when able to, but has been struggling to ambulate ADLs Comments: Wife assists with transfers into shower and bathing/dressing tasks        OT Problem List: Decreased strength;Impaired balance (sitting and/or standing)      OT Treatment/Interventions: Self-care/ADL training;DME and/or AE instruction;Patient/family education;Balance training    OT  Goals(Current goals can be found in the care plan section) Acute Rehab OT Goals Patient Stated Goal: to go home OT Goal Formulation: With patient Time For Goal Achievement: 12/07/21 Potential to Achieve Goals: Good  OT Frequency: Min 2X/week    Co-evaluation   Reason for Co-Treatment: Complexity of the patient's impairments (multi-system involvement);For patient/therapist safety;To address functional/ADL transfers PT goals addressed during session: Mobility/safety with mobility;Balance        AM-PAC OT "6 Clicks" Daily Activity     Outcome Measure Help from another person eating meals?: None Help from another person taking care of personal grooming?: A Little Help from another person toileting, which includes using toliet, bedpan, or urinal?: A Lot Help from another person bathing (including washing, rinsing, drying)?: A Lot Help from another person to put on and taking off regular upper body clothing?: A Little Help from another person to put on and taking off regular lower body clothing?: A Lot 6 Click Score: 16   End of Session Equipment Utilized During Treatment: Gait belt;Rolling walker (2 wheels)  Activity Tolerance: Patient tolerated treatment well Patient left: in bed;with call bell/phone within reach  OT Visit Diagnosis: Unsteadiness on feet (R26.81);Other abnormalities of gait and mobility (R26.89);Muscle weakness (generalized) (M62.81)                Time: 1829-9371 OT Time Calculation (min): 19 min Charges:  OT General Charges $OT Visit:  1 Visit OT Evaluation $OT Eval Moderate Complexity: 1 Mod  Jeremiah Beasley, OTR/L Acute [employer] Pager [phone] Office [phone]    Jeremiah Beasley 11/23/2021, 3:32 PM

## 2021-11-23 NOTE — ED Notes (Signed)
Wound care completed.

## 2021-11-23 NOTE — Progress Notes (Signed)
Spoke with Trinda Pascal, Staff RN, to let her know that Dr. Broadus John will be ordering SQ insulin. Suggested to the nurse that the insulin pump be removed ONLY when the [facility] is given. Semglee 15 units now to be ordered by physician. Staff RN agreed with plan.   Harvel Ricks RN BSN CDE Diabetes Coordinator Pager: [phone]  8am-5pm

## 2021-11-23 NOTE — ED Notes (Signed)
Provided pt OJ and peanut butter and crackers. Pt has appropriate cognition and denies any symptoms of hypoglycemia. Will recheck sugar in approx 30 mins.

## 2021-11-23 NOTE — Evaluation (Signed)
Physical Therapy Evaluation Patient Details Name: Jeremiah Beasley. MRN: [id number] DOB: 10-31-1946 Today's Date: 11/23/2021  History of Present Illness  Jeremiah Beasley. is a 76 y.o. male with medical history significant of PAF on Xarelto, IDDM, CKD 3  Pt with poor functional baseline and progressive decline over past year following Cholecystitis in Dec 2021.  This followed by MRSA cellulitis of leg wound in Sept.  Development of sacral and buttocks decubitus ulcers which became infected with cellulitis in Nov requiring admission to hospital. Has remained weak and mostly non-ambulatory.  Clinical Impression  Pt admitted secondary to problem above with deficits below. Pt requiring min to mod A for bed mobility and min A for transfers and short distance ambulation within the room. Pt reports difficulty at home with ambulation at times and wife assists with mobility as needed. Pt adamant about returning home at d/c and does not want to go to SNF. Recommend continuation of [place] services at d/c and educated about having pt's son help with stair navigation at d/c. Will continue to follow acutely.        Recommendations for follow up therapy are one component of a multi-disciplinary discharge planning process, led by the attending physician.  Recommendations may be updated based on patient status, additional functional criteria and insurance authorization.  Follow Up Recommendations Home health PT (pt refusing SNF)    Assistance Recommended at Discharge Frequent or constant Supervision/Assistance  Patient can return home with the following  A little help with walking and/or transfers;A little help with bathing/dressing/bathroom;Assist for transportation;Help with stairs or ramp for entrance;Assistance with cooking/housework    Equipment Recommendations None recommended by PT  Recommendations for Other Services       Functional Status Assessment Patient has had a recent decline in their functional  status and demonstrates the ability to make significant improvements in function in a reasonable and predictable amount of time.     Precautions / Restrictions Precautions Precautions: Fall Restrictions Weight Bearing Restrictions: No      Mobility  Bed Mobility Overal bed mobility: Needs Assistance Bed Mobility: Supine to Sit;Sit to Supine     Supine to sit: Mod assist Sit to supine: Min assist   General bed mobility comments: Required assist for trunk and LE Assist    Transfers Overall transfer level: Needs assistance Equipment used: Rolling walker (2 wheels) Transfers: Sit to/from Stand Sit to Stand: Min assist;+2 safety/equipment           General transfer comment: Min A for lift assist to stand from stretcher    Ambulation/Gait Ambulation/Gait assistance: Min assist;+2 safety/equipment Gait Distance (Feet): 5 Feet Assistive device: Rolling walker (2 wheels) Gait Pattern/deviations: Step-through pattern;Decreased stride length Gait velocity: Decreased     General Gait Details: Short guarded steps within the room. Min A for steadying.  Stairs            Wheelchair Mobility    Modified Rankin (Stroke Patients Only)       Balance Overall balance assessment: Needs assistance Sitting-balance support: No upper extremity supported;Feet supported Sitting balance-Leahy Scale: Fair     Standing balance support: Bilateral upper extremity supported Standing balance-Leahy Scale: Poor Standing balance comment: Reliant on BUE support                             Pertinent Vitals/Pain Pain Assessment: Faces Faces Pain Scale: Hurts little more Pain Location: buttock Pain Descriptors / Indicators: Guarding;Grimacing Pain Intervention(s):  Limited activity within patient's tolerance;Monitored during session;Repositioned    Home Living Family/patient expects to be discharged to:: Private residence Living Arrangements: Spouse/significant  other Available Help at Discharge: Family;Available 24 hours/day Type of Home: House Home Access: Stairs to enter Entrance Stairs-Rails: None Entrance Stairs-Number of Steps: 1+1+1   Home Layout: Two level;Able to live on main level with bedroom/bathroom Home Equipment: Rolling Walker (2 wheels);Cane - single point;Shower seat;Wheelchair - manual      Prior Function Prior Level of Function : Needs assist             Mobility Comments: Uses RW when able to, but has been struggling to ambulate ADLs Comments: Wife assists with transfers into shower and bathing/dressing tasks     Hand Dominance        Extremity/Trunk Assessment   Upper Extremity Assessment Upper Extremity Assessment: Defer to OT evaluation    Lower Extremity Assessment Lower Extremity Assessment: Generalized weakness (increased swelling in bilateral feet)    Cervical / Trunk Assessment Cervical / Trunk Assessment: Normal  Communication   Communication: No difficulties  Cognition Arousal/Alertness: Awake/alert Behavior During Therapy: WFL for tasks assessed/performed Overall Cognitive Status: Within Functional Limits for tasks assessed                                          General Comments General comments (skin integrity, edema, etc.): Pt adamant about returning home at d/c    Exercises     Assessment/Plan    PT Assessment Patient needs continued PT services  PT Problem List Decreased strength;Decreased activity tolerance;Decreased balance;Decreased mobility;Decreased knowledge of use of DME;Decreased knowledge of precautions       PT Treatment Interventions DME instruction;Gait training;Functional mobility training;Therapeutic exercise;Therapeutic activities;Stair training;Balance training;Patient/family education    PT Goals (Current goals can be found in the Care Plan section)  Acute Rehab PT Goals Patient Stated Goal: to go home PT Goal Formulation: With  patient Time For Goal Achievement: 12/07/21 Potential to Achieve Goals: Good    Frequency Min 3X/week     Co-evaluation PT/OT/SLP Co-Evaluation/Treatment: Yes Reason for Co-Treatment: Complexity of the patient's impairments (multi-system involvement);To address functional/ADL transfers PT goals addressed during session: Mobility/safety with mobility;Balance         AM-PAC PT "6 Clicks" Mobility  Outcome Measure Help needed turning from your back to your side while in a flat bed without using bedrails?: A Little Help needed moving from lying on your back to sitting on the side of a flat bed without using bedrails?: A Lot Help needed moving to and from a bed to a chair (including a wheelchair)?: A Little Help needed standing up from a chair using your arms (e.g., wheelchair or bedside chair)?: A Little Help needed to walk in hospital room?: A Little Help needed climbing 3-5 steps with a railing? : A Lot 6 Click Score: 16    End of Session Equipment Utilized During Treatment: Gait belt Activity Tolerance: Patient tolerated treatment well Patient left: in bed;with call bell/phone within reach (on stretcher in ED) Nurse Communication: Mobility status PT Visit Diagnosis: Unsteadiness on feet (R26.81);Muscle weakness (generalized) (M62.81)    Time: 1330-1350 PT Time Calculation (min) (ACUTE ONLY): 20 min   Charges:   PT Evaluation $PT Eval Low Complexity: 1 Low          Jeremiah Beasley, PT, DPT  Acute Rehabilitation Services  Pager: [phone]  Office: [phone]   Jeremiah Beasley 11/23/2021, 2:18 PM

## 2021-11-24 DIAGNOSIS — L03317 Cellulitis of buttock: Secondary | ICD-10-CM | POA: Diagnosis present

## 2021-11-24 DIAGNOSIS — R791 Abnormal coagulation profile: Secondary | ICD-10-CM | POA: Diagnosis present

## 2021-11-24 DIAGNOSIS — K648 Other hemorrhoids: Secondary | ICD-10-CM | POA: Diagnosis present

## 2021-11-24 DIAGNOSIS — D509 Iron deficiency anemia, unspecified: Secondary | ICD-10-CM

## 2021-11-24 DIAGNOSIS — E1022 Type 1 diabetes mellitus with diabetic chronic kidney disease: Secondary | ICD-10-CM | POA: Diagnosis present

## 2021-11-24 DIAGNOSIS — I480 Paroxysmal atrial fibrillation: Secondary | ICD-10-CM | POA: Diagnosis present

## 2021-11-24 DIAGNOSIS — N400 Enlarged prostate without lower urinary tract symptoms: Secondary | ICD-10-CM | POA: Diagnosis present

## 2021-11-24 DIAGNOSIS — S62642A Nondisplaced fracture of proximal phalanx of right middle finger, initial encounter for closed fracture: Secondary | ICD-10-CM | POA: Diagnosis present

## 2021-11-24 DIAGNOSIS — E871 Hypo-osmolality and hyponatremia: Secondary | ICD-10-CM | POA: Diagnosis present

## 2021-11-24 DIAGNOSIS — E7800 Pure hypercholesterolemia, unspecified: Secondary | ICD-10-CM | POA: Diagnosis present

## 2021-11-24 DIAGNOSIS — D500 Iron deficiency anemia secondary to blood loss (chronic): Secondary | ICD-10-CM | POA: Diagnosis not present

## 2021-11-24 DIAGNOSIS — Z7901 Long term (current) use of anticoagulants: Secondary | ICD-10-CM | POA: Diagnosis not present

## 2021-11-24 DIAGNOSIS — D122 Benign neoplasm of ascending colon: Secondary | ICD-10-CM | POA: Diagnosis present

## 2021-11-24 DIAGNOSIS — D72819 Decreased white blood cell count, unspecified: Secondary | ICD-10-CM | POA: Diagnosis present

## 2021-11-24 DIAGNOSIS — L89323 Pressure ulcer of left buttock, stage 3: Secondary | ICD-10-CM | POA: Diagnosis present

## 2021-11-24 DIAGNOSIS — R627 Adult failure to thrive: Secondary | ICD-10-CM | POA: Diagnosis present

## 2021-11-24 DIAGNOSIS — L89312 Pressure ulcer of right buttock, stage 2: Secondary | ICD-10-CM | POA: Diagnosis present

## 2021-11-24 DIAGNOSIS — E1042 Type 1 diabetes mellitus with diabetic polyneuropathy: Secondary | ICD-10-CM | POA: Diagnosis present

## 2021-11-24 DIAGNOSIS — D123 Benign neoplasm of transverse colon: Secondary | ICD-10-CM | POA: Diagnosis present

## 2021-11-24 DIAGNOSIS — E860 Dehydration: Secondary | ICD-10-CM | POA: Diagnosis present

## 2021-11-24 DIAGNOSIS — E039 Hypothyroidism, unspecified: Secondary | ICD-10-CM | POA: Diagnosis present

## 2021-11-24 DIAGNOSIS — E43 Unspecified severe protein-calorie malnutrition: Secondary | ICD-10-CM | POA: Diagnosis present

## 2021-11-24 DIAGNOSIS — Z20822 Contact with and (suspected) exposure to covid-19: Secondary | ICD-10-CM | POA: Diagnosis present

## 2021-11-24 DIAGNOSIS — N1831 Chronic kidney disease, stage 3a: Secondary | ICD-10-CM | POA: Diagnosis present

## 2021-11-24 DIAGNOSIS — K2950 Unspecified chronic gastritis without bleeding: Secondary | ICD-10-CM | POA: Diagnosis present

## 2021-11-24 DIAGNOSIS — R6510 Systemic inflammatory response syndrome (SIRS) of non-infectious origin without acute organ dysfunction: Secondary | ICD-10-CM | POA: Diagnosis not present

## 2021-11-24 DIAGNOSIS — I129 Hypertensive chronic kidney disease with stage 1 through stage 4 chronic kidney disease, or unspecified chronic kidney disease: Secondary | ICD-10-CM | POA: Diagnosis present

## 2021-11-24 LAB — CBC
HCT: 23.4 % — ABNORMAL LOW (ref 39.0–52.0)
Hemoglobin: 7.8 g/dL — ABNORMAL LOW (ref 13.0–17.0)
MCH: 29.5 pg (ref 26.0–34.0)
MCHC: 33.3 g/dL (ref 30.0–36.0)
MCV: 88.6 fL (ref 80.0–100.0)
Platelets: 187 10*3/uL (ref 150–400)
RBC: 2.64 MIL/uL — ABNORMAL LOW (ref 4.22–5.81)
RDW: 14 % (ref 11.5–15.5)
WBC: 1.5 10*3/uL — ABNORMAL LOW (ref 4.0–10.5)
nRBC: 0 % (ref 0.0–0.2)

## 2021-11-24 LAB — BASIC METABOLIC PANEL
Anion gap: 6 (ref 5–15)
BUN: 17 mg/dL (ref 8–23)
CO2: 22 mmol/L (ref 22–32)
Calcium: 7.9 mg/dL — ABNORMAL LOW (ref 8.9–10.3)
Chloride: 107 mmol/L (ref 98–111)
Creatinine, Ser: 1.38 mg/dL — ABNORMAL HIGH (ref 0.61–1.24)
GFR, Estimated: 54 mL/min — ABNORMAL LOW (ref >60)
Glucose, Bld: 80 mg/dL (ref 70–99)
Potassium: 3.8 mmol/L (ref 3.5–5.1)
Sodium: 135 mmol/L (ref 135–145)

## 2021-11-24 LAB — GLUCOSE, CAPILLARY
Glucose-Capillary: 106 mg/dL — ABNORMAL HIGH (ref 70–99)
Glucose-Capillary: 126 mg/dL — ABNORMAL HIGH (ref 70–99)
Glucose-Capillary: 140 mg/dL — ABNORMAL HIGH (ref 70–99)
Glucose-Capillary: 196 mg/dL — ABNORMAL HIGH (ref 70–99)
Glucose-Capillary: 259 mg/dL — ABNORMAL HIGH (ref 70–99)
Glucose-Capillary: 273 mg/dL — ABNORMAL HIGH (ref 70–99)
Glucose-Capillary: 92 mg/dL (ref 70–99)

## 2021-11-24 LAB — VITAMIN B12: Vitamin B-12: 306 pg/mL (ref 180–914)

## 2021-11-24 LAB — RETICULOCYTES
Immature Retic Fract: 22.2 % — ABNORMAL HIGH (ref 2.3–15.9)
RBC.: 2.62 MIL/uL — ABNORMAL LOW (ref 4.22–5.81)
Retic Count, Absolute: 61.8 10*3/uL (ref 19.0–186.0)
Retic Ct Pct: 2.4 % (ref 0.4–3.1)

## 2021-11-24 LAB — PROTIME-INR
INR: 1.5 — ABNORMAL HIGH (ref 0.8–1.2)
Prothrombin Time: 18.1 seconds — ABNORMAL HIGH (ref 11.4–15.2)

## 2021-11-24 LAB — IRON AND TIBC
Iron: 32 ug/dL — ABNORMAL LOW (ref 45–182)
Saturation Ratios: 21 % (ref 17.9–39.5)
TIBC: 153 ug/dL — ABNORMAL LOW (ref 250–450)
UIBC: 121 ug/dL

## 2021-11-24 LAB — FERRITIN: Ferritin: 94 ng/mL (ref 24–336)

## 2021-11-24 LAB — FOLATE: Folate: 15.6 ng/mL (ref >5.9)

## 2021-11-24 MED ORDER — PEG-KCL-NACL-NASULF-NA ASC-C 100 G PO SOLR
0.5000 | Freq: Once | ORAL | Status: DC
  Filled 2021-11-24: qty 1

## 2021-11-24 MED ORDER — ENSURE ENLIVE PO LIQD
237.0000 mL | Freq: Two times a day (BID) | ORAL | Status: DC
  Administered 2021-11-26: 237 mL via ORAL

## 2021-11-24 MED ORDER — PEG-KCL-NACL-NASULF-NA ASC-C 100 G PO SOLR: 0.5000 | Freq: Once | ORAL | Status: DC

## 2021-11-24 MED ORDER — PEG-KCL-NACL-NASULF-NA ASC-C 100 G PO SOLR
1.0000 | Freq: Once | ORAL | Status: DC
Start: 2021-11-25 — End: 2021-11-24

## 2021-11-24 MED ORDER — PEG-KCL-NACL-NASULF-NA ASC-C 100 G PO SOLR
0.5000 | Freq: Once | ORAL | Status: DC
Start: 2021-11-25 — End: 2021-11-24

## 2021-11-24 MED ORDER — ADULT MULTIVITAMIN W/MINERALS CH
1.0000 | ORAL_TABLET | Freq: Every day | ORAL | Status: DC
  Administered 2021-11-24 – 2021-11-27 (×3): 1 via ORAL
  Filled 2021-11-24 (×3): qty 1

## 2021-11-24 MED ORDER — PEG-KCL-NACL-NASULF-NA ASC-C 100 G PO SOLR
0.5000 | Freq: Once | ORAL | Status: DC
  Filled 2021-11-24 (×2): qty 1

## 2021-11-24 MED ORDER — SODIUM CHLORIDE 0.9 % IV SOLN
250.0000 mg | Freq: Every day | INTRAVENOUS | Status: AC
  Administered 2021-11-24 – 2021-11-25 (×2): 250 mg via INTRAVENOUS
  Filled 2021-11-24 (×2): qty 20

## 2021-11-24 MED ORDER — FERROUS SULFATE 325 (65 FE) MG PO TABS
325.0000 mg | ORAL_TABLET | Freq: Two times a day (BID) | ORAL | Status: DC
  Administered 2021-11-24 – 2021-11-27 (×3): 325 mg via ORAL
  Filled 2021-11-24 (×4): qty 1

## 2021-11-24 MED ORDER — PEG-KCL-NACL-NASULF-NA ASC-C 100 G PO SOLR
0.5000 | Freq: Once | ORAL | Status: AC
  Administered 2021-11-25: 100 g via ORAL
  Filled 2021-11-24: qty 1

## 2021-11-24 MED ORDER — PROSOURCE PLUS PO LIQD
30.0000 mL | Freq: Two times a day (BID) | ORAL | Status: DC
  Administered 2021-11-24 – 2021-11-27 (×2): 30 mL via ORAL
  Filled 2021-11-24 (×3): qty 30

## 2021-11-24 MED ORDER — ENSURE ENLIVE PO LIQD: 237.0000 mL | Freq: Two times a day (BID) | ORAL | Status: DC

## 2021-11-24 NOTE — Progress Notes (Signed)
Pt admitted to 6n27 from ED via stretcher. Son at bedside. Pt unable to slide self from stretcher to bed. Insulin pump removed in ED. Alert, denies pain/ nausea at this time.

## 2021-11-24 NOTE — Progress Notes (Signed)
Inpatient Diabetes Program Recommendations  AACE/ADA: New Consensus Statement on Inpatient Glycemic Control (2015)  Target Ranges:  Prepandial:   less than 140 mg/dL      Peak postprandial:   less than 180 mg/dL (1-2 hours)      Critically ill patients:  140 - 180 mg/dL   Lab Results  Component Value Date   GLUCAP 273 (H) 11/24/2021   HGBA1C 6.0 (H) 10/07/2021    Review of Glycemic Control  Diabetes history: type 1 Outpatient Diabetes medications: Insulin pump Insulin: Lispro (Humalog) Pump Brand/Model: Medtronic  Basal Total Daily Basal Insulin: 32.1  Basal Time: 00:00 (1) Basal Rate (Units/Hr): 1.4  Basal Time: 04:00 (2) Basal Rate (Units/Hr): 1.5  Basal Time: 07:00 (3) Basal Rate (Units/Hr): 1.4  Basal Time: 18:00 (4) Basal Rate (Units/Hr): 1.1   Insulin:Carb ICR Time: 00:00 (1) ICR : 9.5 ICR Time: 06:00 (2) ICR : 7 ICR Time: 11:00 (3) ICR: 8  ICR Time: 18:00 (4) ICR: 7.5  ICR Time: 21:00 (5) ICR: 8.5   Sensitivity ISF Time: 00:00 (1) ISF: 20  Target BG Time: 00:00 (1) Target BG (mg/dL): 140  BG Time: 07:30 (2) Target BG (mg/dL): 130  BG Time: 22:00 (3) Target BG (mg/dL): 140  Current orders for Inpatient glycemic control:  Semglee 15 units Daily Novolog 0-9 units tid Novolog 3 units tid meal coverage Ensure enlive bid between meals  Inpatient Diabetes Program Recommendations:    Note Novolog meal coverage not given this am for breakfast even though pt met parameters. Watch trends for now.Pt also on Ensure Enlive which contributes to hyperglycemia.  NURSING: pt requires insulin for carbohydrate coverage give Novolog meal coverage if pt eats at least 50% of meals and glucose is at least 80 mg/dl.  Thanks,  Tama Headings RN, MSN, BC-ADM Inpatient Diabetes Coordinator Team Pager [phone] (8a-5p)

## 2021-11-24 NOTE — TOC Initial Note (Signed)
Transition of Care (TOC) - Initial/Assessment Note    Patient Details  Name: Jeremiah Beasley. MRN: [id number] Date of Birth: 1946-08-20  Transition of Care [facility]) CM/SW Contact:    Bartholomew Crews, RN Phone Number: [phone] 11/24/2021, 11:03 AM  Clinical Narrative:                  Spoke with patient and spouse at the bedside. Patient currently active with [facility] RN and PT with [facility] - confirmed with Jeremiah Beasley. Patient will need HH RN, PT and Face to Face orders for resumption of services when discharged. TOC following for transition needs.   Expected Discharge Plan: [place] Barriers to Discharge: Continued Medical Work up   Patient Goals and CMS Choice Patient states their goals for this hospitalization and ongoing recovery are:: return home with wife CMS Medicare.gov Compare Post Acute Care list provided to:: Patient Choice offered to / list presented to : Patient, Spouse  Expected Discharge Plan and Services Expected Discharge Plan: [place]   Discharge Planning Services: CM Consult Post Acute Care Choice: [place] arrangements for the past 2 months: Single Family Home                           HH Arranged: RN, PT [facility] Agency: [place] Date [place]: 11/24/21 Time HH Agency Contacted: 44 Representative spoke with at [place]: Jeremiah Beasley  Prior Living Arrangements/Services Living arrangements for the past 2 months: [place] with:: Self, Spouse Patient language and need for interpreter reviewed:: Yes        Need for Family Participation in Patient Care: Yes (Comment) Care giver support system in place?: Yes (comment) Current home services: Home PT, Home RN Criminal Activity/Legal Involvement Pertinent to Current Situation/Hospitalization: No - Comment as needed  Activities of Daily Living      Permission Sought/Granted Permission sought to share information with : Family  Supports Permission granted to share information with : Yes, Verbal Permission Granted  Share Information with NAME: Jeremiah Beasley     Permission granted to share info w Relationship: spouse  Permission granted to share info w Contact Information: [phone]  Emotional Assessment Appearance:: Appears stated age Attitude/Demeanor/Rapport: Engaged Affect (typically observed): Accepting Orientation: : Oriented to Self, Oriented to Place, Oriented to  Time, Oriented to Situation Alcohol / Substance Use: Not Applicable Psych Involvement: No (comment)  Admission diagnosis:  Cellulitis of buttock [L03.317] Patient Active Problem List   Diagnosis Date Noted   Anemia 11/23/2021   Severe protein-calorie malnutrition ([place]) 11/23/2021   Cellulitis of buttock 11/23/2021   Decubitus ulcers 11/23/2021   Sepsis ([place]) 11/23/2021   MRSA (methicillin resistant staph aureus) culture positive 11/02/2021   Bradycardia 10/10/2021   Hyponatremia 10/06/2021   Generalized weakness 10/06/2021   Type 1 diabetes with stage 3 chronic kidney disease moderate GFR 30-59 ([place]) 09/11/2021   Benign hypertension with chronic kidney disease, stage III ([place]) 09/11/2021   Hyperlipidemia 07/14/2021   Body mass index (BMI) 28.0-28.9, adult 01/10/2021   Chronic pain syndrome 01/10/2021   On anticoagulant therapy 01/10/2021   Primary osteoarthritis of right knee 01/10/2021   Hypertension    Pneumonia 10/20/2020   S/P right knee arthroscopy 04/25/2020   Chronic pain of right knee 04/11/2020   Essential (primary) hypertension 05/15/2015   Paroxysmal atrial fibrillation ([place]) 05/15/2015   Pure hypercholesterolemia 05/15/2015   Type 2 diabetes mellitus  without complication ([place]) 16/08/9603   PCP:  [name], MD Pharmacy:   [id number] - [place], [place] [address] Phone: [phone] Fax: [phone]     Social Determinants of Health (SDOH)  Interventions    Readmission Risk Interventions No flowsheet data found.

## 2021-11-24 NOTE — Progress Notes (Signed)
Initial Nutrition Assessment  DOCUMENTATION CODES:   Not applicable  INTERVENTION:  Provide 30 ml Prosource plus po BID, each supplement provides 100 kcal and 15 grams of protein.   Provide Ensure Enlive po BID, each supplement provides 350 kcal and 20 grams of protein.  Provide MVI once daily.   NUTRITION DIAGNOSIS:   Increased nutrient needs related to wound healing as evidenced by estimated needs.  GOAL:   Patient will meet greater than or equal to 90% of their needs  MONITOR:   PO intake, Supplement acceptance, Skin, Weight trends, Labs, I & O's  REASON FOR ASSESSMENT:   Consult Assessment of nutrition requirement/status, Wound healing  ASSESSMENT:   76 y.o. male with medical history significant of PAF, IDDM Type 1 diabetes, CKD 3. Presents with fever, cellulitis of buttock. Pt with history of persistent sacral and buttocks decubitus ulcers. Pt with sepsis due to cellulitis of the buttock.  Per MD, plan for colonoscopy Monday. Hemoglobin low at 7.8. Meal completion 50%. Spoke with wife via phone. Pt with decreased appetite current and prior to admission. Wife has been encouraging PO intake as well as in increased protein needs for adequate nutrition and healing. Wife reports she has been providing pt with protein shakes from home (Gatorade with protein) and has recently ordered Prosource plus for pt at home. RD to order nutritional supplements to aid in wound healing. Unable to complete Nutrition-Focused physical exam at this time.   Labs and medications reviewed. Iron low at 32. TIBC low at 153. Ferrous sulfate 325 mg ordered BID. Ferric gluconate ordered.   Diet Order:   Diet Order             Diet NPO time specified  Diet effective midnight           Diet clear liquid Room service appropriate? Yes; Fluid consistency: Thin  Diet effective 0500 tomorrow           Diet Carb Modified Fluid consistency: Thin; Room service appropriate? Yes  Diet effective now                    EDUCATION NEEDS:   Not appropriate for education at this time  Skin:  Skin Assessment: Skin Integrity Issues: Skin Integrity Issues:: Stage II, Stage III Stage II: R buttocks Stage III: L buttocks  Last BM:  1/5  Height:   Ht Readings from Last 1 Encounters:  11/09/21 5' 9.5" (1.765 m)    Weight:   Wt Readings from Last 1 Encounters:  11/09/21 83.2 kg   BMI:  There is no height or weight on file to calculate BMI.  Estimated Nutritional Needs:   Kcal:  2150-2350  Protein:  110-120 grams  Fluid:  >/= 2 L/day  Corrin Parker, MS, RD, LDN RD pager number/after hours weekend pager number on Amion.

## 2021-11-24 NOTE — Care Management Obs Status (Signed)
[place] NOTIFICATION   Patient Details  Name: Jeremiah Beasley. MRN: [id number] Date of Birth: 17-Jun-1946   Medicare Observation Status Notification Given:  Yes    Bartholomew Crews, RN 11/24/2021, 10:59 AM

## 2021-11-24 NOTE — Plan of Care (Signed)
°  Problem: Pain Managment: Goal: General experience of comfort will improve Outcome: Progressing   Problem: Skin Integrity: Goal: Risk for impaired skin integrity will decrease Outcome: Progressing   Problem: Nutrition: Goal: Adequate nutrition will be maintained Outcome: Not Progressing

## 2021-11-24 NOTE — Consult Note (Addendum)
Consultation  Referring Provider: Dr. Broadus     Primary Care Physician:  [name], [name], MD Primary Gastroenterologist: Althia Forts (was arranged for a GI appointment with Dr. Lyda Jester outpatient)        Reason for Consultation: IDA            HPI:   Jeremiah Beasley. is a 76 y.o. male with a past medical history significant for PAF on Xarelto, IDDM, CKD stage III and poor functional baseline, who presented to the ER on 11/23/2021 with fever.    Recent history of progressive decline over the past year following cholecystitis in December 2021, followed by MRSA cellulitis of leg wound in September, development of sacral and buttock decubitus ulcers which became infected with cellulitis in November requiring admission to the hospital.  Despite initial successful treatment of cellulitis patient remained weak and mostly nonambulatory with poor p.o. intake so wound healing has been an ongoing issue and the decubitus ulcer persists.    At time of presentation to the ER yesterday patient had been running a fever at home with increased pain to the buttock area improved with Tylenol.  We are consulted now in regards to the patient's iron deficiency anemia.    In regards to iron deficiency anemia patient was diagnosed in November 2022 and required transfusion, had EGD during that hospitalization with no bleeding site, discharged home before colonoscopy could be performed though this was recommended, he had since been referred to another GI specialist but has not followed up yet.  Wife is wanting colonoscopy before patient leaves the hospital as he is debilitated and it be hard for him to prep at home.    Today, the patient is found resting comfortably in bed with his wife by his bedside.  She explains to be very hard for him to do a bowel prep at home and she wants him to have a colonoscopy before he leaves the hospital.  She is worried because she has been monitoring his labs online and saw his hemoglobin  dropped overnight from 8.4-7.8.  Patient tells me that he has no GI history other than occasional eructations over the past 6 months for which she has been on Omeprazole which does not seem to be helping.  He tells me that he has had trouble since his acute cholecystitis in December 2021 (as noted above).  In general Lialda is trying to get everything worked up.  He has seen no signs of acute GI bleeding.    Denies weight loss, increased shortness of breath or chest palpitations.  ED course: Temperature 100.7, WBC 1.8, hemoglobin 8.4 (10.4 on 12/23), Hemoccult negative  GI history: 10/10/2021 EGD with Dr. Alessandra Bevels during admission with chronic gastritis.  Biopsy showed gastric antral mucosa with nonspecific reactive gastropathy with chronic gastritis and intestinal metaplasia.  Patient was told to follow-up with colonoscopy for further evaluation of his iron deficiency anemia.  Past Medical History:  Diagnosis Date   Atrial fibrillation ([place])    Chronic pain of right knee 04/11/2020   Diabetes mellitus without complication ([place])    Essential hypertension 05/15/2015   Hyperlipidemia    Hypertension    MRSA (methicillin resistant staph aureus) culture positive    Paroxysmal atrial fibrillation ([place]) 05/15/2015   Pneumonia 10/20/2020   Pure hypercholesterolemia 05/15/2015   S/P right knee arthroscopy 04/25/2020   Type 2 diabetes mellitus without complication ([place]) 95/62/1308    Past Surgical History:  Procedure Laterality Date   BIOPSY  10/10/2021  Procedure: BIOPSY;  Surgeon: Otis Brace, MD;  Location: [facility];  Service: Gastroenterology;;   ESOPHAGOGASTRODUODENOSCOPY (EGD) WITH PROPOFOL N/A 10/10/2021   Procedure: ESOPHAGOGASTRODUODENOSCOPY (EGD) WITH PROPOFOL;  Surgeon: Otis Brace, MD;  Location: [facility];  Service: Gastroenterology;  Laterality: N/A;   gall bladder removed   10/20/2020   Iron infusion  05/18/2021   x2   NO PAST SURGERIES      Family  History  Problem Relation Age of Onset   Heart attack Father    Hyperlipidemia Father    Stroke Father     Social History   Tobacco Use   Smoking status: Never   Smokeless tobacco: Current  Vaping Use   Vaping Use: Never used  Substance Use Topics   Alcohol use: No   Drug use: No    Prior to Admission medications   Medication Sig Start Date End Date Taking? Authorizing Provider  acetaminophen (TYLENOL) 325 MG tablet Take 2 tablets (650 mg total) by mouth every 6 (six) hours as needed for mild pain, fever or headache. 10/10/21  Yes Debbe Odea, MD  diphenhydrAMINE (BENADRYL) 25 mg capsule Take 25 mg by mouth at bedtime.   Yes [provider]  ezetimibe (ZETIA) 10 MG tablet Take 1 tablet (10 mg total) by mouth daily. 06/08/20 11/23/21 Yes Park Liter, MD  gabapentin (NEURONTIN) 300 MG capsule Take 1 capsule (300 mg total) by mouth 2 (two) times daily. 09/24/21  Yes Patel, Donika K, DO  hydrALAZINE (APRESOLINE) 100 MG tablet Take 100 mg by mouth 3 (three) times daily. 01/25/21  Yes [provider]  Insulin Human (INSULIN PUMP) SOLN Inject 1 each into the skin continuous.   Yes [provider]  levothyroxine (SYNTHROID, LEVOTHROID) 75 MCG tablet Take 75 mcg by mouth daily before breakfast. 05/22/16  Yes [provider]  Multiple Vitamins-Minerals (CENTRUM SILVER ADULT 50+ PO) Take 1 tablet by mouth daily with breakfast.   Yes [provider]  omeprazole (PRILOSEC) 40 MG capsule Take 40 mg by mouth daily before supper. 02/16/21  Yes [provider]  rivaroxaban (XARELTO) 20 MG TABS tablet Take 20 mg by mouth daily with supper. 10/08/16  Yes [provider]  simvastatin (ZOCOR) 80 MG tablet Take 80 mg by mouth daily. 03/12/19  Yes [provider]    Current Facility-Administered Medications  Medication Dose Route Frequency Provider Last Rate Last Admin   acetaminophen (TYLENOL) tablet 650 mg  650 mg Oral Q6H PRN  Etta Quill, DO   650 mg at 11/23/21 2141   cefTRIAXone (ROCEPHIN) 1 g in sodium chloride 0.9 % 100 mL IVPB  1 g Intravenous Q24H Jennette Kettle M, DO 200 mL/hr at 11/24/21 0445 1 g at 11/24/21 0445   diphenhydrAMINE (BENADRYL) capsule 25 mg  25 mg Oral QHS Jennette Kettle M, DO   25 mg at 11/23/21 2141   ezetimibe (ZETIA) tablet 10 mg  10 mg Oral Daily Jennette Kettle M, DO   10 mg at 11/24/21 0946   ferric gluconate (FERRLECIT) 250 mg in sodium chloride 0.9 % 250 mL IVPB  250 mg Intravenous Daily Domenic Polite, MD 135 mL/hr at 11/24/21 1130 250 mg at 11/24/21 1130   ferrous sulfate tablet 325 mg  325 mg Oral BID WC Etta Quill, DO   325 mg at 11/24/21 0947   gabapentin (NEURONTIN) capsule 300 mg  300 mg Oral BID Etta Quill, DO   300 mg at 11/24/21 0946   insulin aspart (  novoLOG) injection 0-9 Units  0-9 Units Subcutaneous TID WC Domenic Polite, MD   1 Units at 11/24/21 0945   insulin aspart (novoLOG) injection 3 Units  3 Units Subcutaneous TID WC Domenic Polite, MD   3 Units at 11/23/21 1735   insulin glargine-yfgn [facility]) injection 15 Units  15 Units Subcutaneous Daily Domenic Polite, MD   15 Units at 11/24/21 0946   levothyroxine (SYNTHROID) tablet 75 mcg  75 mcg Oral Q0600 Etta Quill, DO   75 mcg at 11/24/21 0615   mupirocin cream (BACTROBAN) 2 %   Topical Daily Domenic Polite, MD   Given at 11/24/21 0949   ondansetron (ZOFRAN) tablet 4 mg  4 mg Oral Q6H PRN Etta Quill, DO       Or   ondansetron [facility]) injection 4 mg  4 mg Intravenous Q6H PRN Etta Quill, DO       pantoprazole (PROTONIX) EC tablet 80 mg  80 mg Oral QHS Jennette Kettle M, DO   80 mg at 11/23/21 2141   simvastatin (ZOCOR) tablet 40 mg  40 mg Oral q1800 Domenic Polite, MD        Allergies as of 11/23/2021 - Review Complete 11/23/2021  Allergen Reaction Noted   Quinapril Shortness Of Breath, Swelling, and Other (See Comments) 11/08/2020   Methaqualone Other (See Comments) 03/28/2020    Tape Rash and Other (See Comments) 10/06/2021     Review of Systems:    Constitutional: No weight loss Skin: No rash  Cardiovascular: No chest pain Respiratory: No SOB  Gastrointestinal: See HPI and otherwise negative Genitourinary: No dysuria  Neurological: No headache, dizziness or syncope Musculoskeletal: No new muscle or joint pain Hematologic: No bruising Psychiatric: No history of depression or anxiety    Physical Exam:  Vital signs in last 24 hours: Temp:  [97.5 F (36.4 C)-98.3 F (36.8 C)] 97.5 F (36.4 C) (01/07 0810) Pulse Rate:  [52-78] 62 (01/07 0810) Resp:  [15-25] 17 (01/07 0810) BP: (135-173)/(62-86) 155/68 (01/07 0810) SpO2:  [94 %-98 %] 98 % (01/07 0810) Last BM Date: 11/22/20 General:   Pleasant Caucasian male appears to be in NAD, Well developed, Well nourished, alert and cooperative Head:  Normocephalic and atraumatic. Eyes:   PEERL, EOMI. No icterus. Conjunctiva pink. Ears:  Normal auditory acuity. Neck:  Supple Throat: Oral cavity and pharynx without inflammation, swelling or lesion. Teeth in good condition. Lungs: Respirations even and unlabored. Lungs clear to auscultation bilaterally.   No wheezes, crackles, or rhonchi.  Heart: Normal S1, S2. No MRG. Regular rate and rhythm. No peripheral edema, cyanosis or pallor.  Abdomen:  Soft, nondistended, nontender. No rebound or guarding. Normal bowel sounds. No appreciable masses or hepatomegaly. Rectal:  Not performed.  Msk:  Symmetrical without gross deformities. Peripheral pulses intact.  Extremities:  Without edema, no deformity or joint abnormality.  Neurologic:  Alert and  oriented x4;  grossly normal neurologically.  Skin:   Dry and intact without significant lesions or rashes.+ Sacral decubitus ulcers Psychiatric: Demonstrates good judgement and reason without abnormal affect or behaviors.   LAB RESULTS: Recent Labs    11/23/21 0051 11/24/21 0102  WBC 1.8* 1.5*  HGB 8.4* 7.8*  HCT 25.7*  23.4*  PLT 203 187   BMET Recent Labs    11/23/21 0051 11/24/21 0102  NA 130* 135  K 3.5 3.8  CL 101 107  CO2 22 22  GLUCOSE 105* 80  BUN 19 17  CREATININE 1.56* 1.38*  CALCIUM 7.6*  7.9*   LFT Recent Labs    11/23/21 0051  PROT 6.5  ALBUMIN 2.4*  AST 25  ALT 16  ALKPHOS 59  BILITOT 0.8   PT/INR Recent Labs    11/23/21 0051  LABPROT 35.5*  INR 3.5*    STUDIES: CT ABDOMEN PELVIS W CONTRAST  Result Date: 11/23/2021 CLINICAL DATA:  Sacral wound infection. EXAM: CT ABDOMEN AND PELVIS WITH CONTRAST TECHNIQUE: Multidetector CT imaging of the abdomen and pelvis was performed using the standard protocol following bolus administration of intravenous contrast. CONTRAST:  147mL OMNIPAQUE IOHEXOL 300 MG/ML  SOLN COMPARISON:  August 20, 2021 FINDINGS: Lower chest: Moderate severity atelectasis is seen within the posterior aspect of the bilateral lung bases. There is a very small right pleural effusion. Hepatobiliary: No focal liver abnormality is seen. Status post cholecystectomy. No biliary dilatation. Pancreas: Unremarkable. No pancreatic ductal dilatation or surrounding inflammatory changes. Spleen: A 3.3 cm x 2.1 cm area of low attenuation is seen along the anterior tip of an enlarged spleen. Adrenals/Urinary Tract: Adrenal glands are unremarkable. Kidneys are normal in size, without renal calculi or hydronephrosis. A 1.6 cm diameter cystic appearing area is seen along the upper pole of the left kidney. Urinary bladder is moderately distended. Stomach/Bowel: Stomach is within normal limits. Appendix appears normal. No evidence of bowel wall thickening, distention, or inflammatory changes. Vascular/Lymphatic: Aortic atherosclerosis. No enlarged abdominal or pelvic lymph nodes. Reproductive: Prostate is unremarkable. Other: No abdominal wall hernia or abnormality. No abdominopelvic ascites. Musculoskeletal: A small superficial soft tissue defect is seen at the level of the lower sacrum,  along the midline. A mild amount of associated soft tissue thickening is seen. Mild subcutaneous inflammatory fat stranding is seen along the posteromedial aspect of the gluteal region on the left. No acute osseous abnormality is identified. IMPRESSION: 1. Small superficial sacral also, without evidence of associated fluid collection, abscess or acute osteomyelitis. 2. Mild cellulitis along the posteromedial aspect of the gluteal region on the left. 3. Moderate severity bibasilar atelectasis with a very small right pleural effusion. 4. Splenomegaly with additional findings that may represent sequelae associated with a small splenic infarct. 5. Aortic atherosclerosis. Aortic Atherosclerosis (ICD10-I70.0). Electronically Signed   By: Virgina Norfolk M.D.   On: 11/23/2021 03:14   DG Chest Port 1 View  Result Date: 11/23/2021 CLINICAL DATA:  Fever and decreased appetite. EXAM: PORTABLE CHEST 1 VIEW COMPARISON:  October 06, 2021 FINDINGS: Mild atelectasis is seen within the bilateral lung bases. There is no evidence of acute infiltrate, pleural effusion or pneumothorax. The cardiac silhouette is mildly enlarged and unchanged in size. The visualized skeletal structures are unremarkable. IMPRESSION: Stable cardiomegaly with mild bibasilar atelectasis. Electronically Signed   By: Virgina Norfolk M.D.   On: 11/23/2021 01:10   DG Hand Complete Right  Result Date: 11/23/2021 CLINICAL DATA:  Fall, concern for fracture. EXAM: RIGHT HAND - COMPLETE 3+ VIEW COMPARISON:  None. FINDINGS: There is a nondisplaced fracture at the base of the proximal phalanx of the third digit. The remaining bony structures appear intact. There is no dislocation. Joint space narrowing and osteophyte formation is noted at the interphalangeal joints and first carpometacarpal joint. Extensive vascular calcifications are present. There is soft tissue swelling over the third metacarpal phalangeal joint. IMPRESSION: Nondisplaced fracture at the  base of the proximal phalanx of the third digit. Electronically Signed   By: Brett Fairy M.D.   On: 11/23/2021 03:28     Impression / Plan:   Impression: 1.  Iron deficiency anemia: Diagnosed in November, Hemoccult negative on 1/6, previous EGD unrevealing in November, recommendations were for colonoscopy which has not been completed, currently on p.o. iron replacement, iron studies today show an iron low at 32, TIBC low at 153, ferritin 94, percent saturation 21, hemoglobin 10.4 on 12/23--> 8.4 on 1/6--> 7.8 this morning; consider lower GI bleed or small bowel source versus chronic due to CKD 2.  Sepsis due to cellulitis of the buttock: In setting of decubitus ulcers, admitted in November with leukopenia, empiric Rocephin plus Vanco given her history of MRSA cellulitis, wound care consulted now, mild cellulitis according to CT scan, though it does not like there is anything surgical at this time 3.  PAF: Xarelto on hold, last dose 11/23/2018 3 PM in hopes for colonoscopy this admission 4.  Diabetes 5.  CKD stage III 6.  Elevated INR of 3.5  Plan: 1.  Recheck INR, this will need to be 2 or below in order to proceed with colonoscopy.  Hopefully this will drift down Xarelto is on hold. 2.  Patient agrees to colonoscopy this admission and would like this done before he leaves the hospital.  He does not want to have it done until Monday, 11/26/2021 because "he has to much going on otherwise" 3.  We will plan for colonoscopy on Monday.  Discussed with patient that he will undergo bowel prep on Sunday afternoon. He will be on clear liquids tomorrow and n.p.o. at midnight.  We will order a bedside commode medically easier for him.   4.  Ordered a movi prep to be started tomorrow afternoon in split dose fashion and n.p.o. at midnight 5.  Clear liquid diet tomorrow. 6.  Bedside commode ordered 7.  Discussed with patient that we would likely not be seeing him tomorrow, but will see him on Monday for a time of  colonoscopy. 8.  We will hold p.o. iron tomorrow during bowel prep.  Thank you for your kind consultation, we will continue to follow.  Lavone Nian Lemmon  11/24/2021, 12:27 PM  GI ATTENDING  History, laboratories, x-rays, prior endoscopy report personally reviewed.  As well, patient seen and examined.  Agree with comprehensive consultation note as outlined above.  This patient has multiple problems.  Presents with fevers.  Chronic cellulitis cubitus ulcers.  Has paroxysmal atrial fibrillation on Xarelto and chronic renal insufficiency.  We are asked to see him regarding chronic anemia, iron deficient.  Upper endoscopy which was feeling.  He was anticipating outpatient colonoscopy elsewhere but this has not been accomplished.  He requested that he have his colonoscopy inpatient purposes given his medical problems.  This is reasonable.  He is an appropriate candidate without contraindication.  He does need adequate Eliquis washout renal insufficiency.  Tentative plans are for colonoscopy on Monday. The nature of the procedure, as well as the risks, benefits, and alternatives were carefully and thoroughly reviewed with the patient. Ample time for discussion and questions allowed. The patient understood, was satisfied, and agreed to proceed.  We will check on him tomorrow  Docia Chuck. Geri Seminole., M.D. [facility] Division of Gastroenterology

## 2021-11-24 NOTE — Progress Notes (Signed)
PROGRESS NOTE    Jeremiah Beasley.  VQM:[id number] DOB: 1946-07-11 DOA: 11/23/2021 PCP: [name], MD  Brief Narrative:Jeremiah Beasley is a 75/M with history of paroxysmal atrial fibrillation on Xarelto, insulin-dependent diabetes, CKD stage IIIa, iron deficiency anemia, with poor functional status and progressive decline since December 2021.  He was hospitalized in September with sacral decubitus wounds, cellulitis requiring antibiotics and inpatient admission. -Continue to have failure to thrive, minimally ambulatory at baseline. -Presented to the ED 1/6 with fever, decreased appetite, per EMS temp was 104.  Also has a low-grade cough.  In the ED temp was 100.7, WBC was 1.8, hemoglobin 8.4 down from 10.4 in December   Assessment & Plan:   Fever, SIRS -Has superficial sacral decubitus wounds and mild cellulitis on CT, physical exam not very impressive -Resolved, afebrile now -COVID, influenza and respiratory virus panels are negative -Continue empiric Rocephin, discontinue vancomycin, blood cultures are negative  -Discontinue IV fluids   Iron deficiency anemia -Diagnosed 2 months ago -on Xarelto for A. fib, had an EGD in November which was unrevealing, colonoscopy was discussed then but patient declined, day before Thanksgiving, he was referred to gastroenterology and is supposed to see Dr. Lyda Jester for consultation on Monday  -Per wife he is too weak to complete a prep at home, she continues to insist on inpatient work-up, patient finally agreeable to this as well -Will request unassigned GI input, give IV iron today, hold Xarelto, last dose was 1/6  -May need blood transfusion if hemoglobin trends down further   Paroxysmal atrial fibrillation -Holding Xarelto today   Type 1 diabetes mellitus -Insulin pump discontinued while inpatient, resume at discharge, continue Semglee, meal coverage and sliding scale  Diabetic peripheral neuropathy -Continue gabapentin   Hypertension -BP  stable, hydralazine on hold   Sacral decubitus wounds -WOC consult, frequent repositioning   Moderate protein calorie malnutrition   CKD 3a -Creatinine at baseline  DVT prophylaxis: SCDs Code Status: Limited code Family Communication: Wife at bedside Disposition Plan:  Status is: Observation  Consultants:  Gastroenterology  Procedures:   Antimicrobials:    Subjective: -Feels a bit tired but okay overall, denies any fevers or chills, denies any cough  Objective: Vitals:   11/23/21 2023 11/24/21 0007 11/24/21 0448 11/24/21 0810  BP: (!) 173/85 135/86 (!) 153/71 (!) 155/68  Pulse: 77 68 (!) 52 62  Resp: 18 17 19 17   Temp: 98.2 F (36.8 C) 97.8 F (36.6 C) 97.8 F (36.6 C) (!) 97.5 F (36.4 C)  TempSrc:    Oral  SpO2: 97% 98% 97% 98%    Intake/Output Summary (Last 24 hours) at 11/24/2021 1142 Last data filed at 11/24/2021 0900 Gross per 24 hour  Intake 680 ml  Output 700 ml  Net -20 ml   There were no vitals filed for this visit.  Examination:  General exam: Appears calm and comfortable  Respiratory system: Clear to auscultation Cardiovascular system: S1 & S2 heard, RRR.  Abd: nondistended, soft and nontender.Normal bowel sounds heard. Central nervous system: Alert and oriented. No focal neurological deficits. Extremities: no edema Skin: Small superficial sacral decubitus wound Psychiatry: Judgement and insight appear normal. Mood & affect appropriate.     Data Reviewed:   CBC: Recent Labs  Lab 11/23/21 0051 11/24/21 0102  WBC 1.8* 1.5*  NEUTROABS 1.2*  --   HGB 8.4* 7.8*  HCT 25.7* 23.4*  MCV 89.9 88.6  PLT 203 932   Basic Metabolic Panel: Recent Labs  Lab 11/23/21 0051 11/24/21  0102  NA 130* 135  K 3.5 3.8  CL 101 107  CO2 22 22  GLUCOSE 105* 80  BUN 19 17  CREATININE 1.56* 1.38*  CALCIUM 7.6* 7.9*   GFR: CrCl cannot be calculated (Unknown ideal weight.). Liver Function Tests: Recent Labs  Lab 11/23/21 0051  AST 25  ALT 16   ALKPHOS 59  BILITOT 0.8  PROT 6.5  ALBUMIN 2.4*   No results for input(s): LIPASE, AMYLASE in the last 168 hours. No results for input(s): AMMONIA in the last 168 hours. Coagulation Profile: Recent Labs  Lab 11/23/21 0051  INR 3.5*   Cardiac Enzymes: No results for input(s): CKTOTAL, CKMB, CKMBINDEX, TROPONINI in the last 168 hours. BNP (last 3 results) No results for input(s): PROBNP in the last 8760 hours. HbA1C: No results for input(s): HGBA1C in the last 72 hours. CBG: Recent Labs  Lab 11/24/21 0011 11/24/21 0243 11/24/21 0444 11/24/21 0811 11/24/21 1135  GLUCAP 92 106* 126* 140* 273*   Lipid Profile: No results for input(s): CHOL, HDL, LDLCALC, TRIG, CHOLHDL, LDLDIRECT in the last 72 hours. Thyroid Function Tests: No results for input(s): TSH, T4TOTAL, FREET4, T3FREE, THYROIDAB in the last 72 hours. Anemia Panel: Recent Labs    11/24/21 0102  VITAMINB12 306  FOLATE 15.6  FERRITIN 94  TIBC 153*  IRON 32*  RETICCTPCT 2.4   Urine analysis:    Component Value Date/Time   COLORURINE YELLOW 11/23/2021 0051   APPEARANCEUR CLEAR 11/23/2021 0051   LABSPEC 1.011 11/23/2021 0051   PHURINE 5.0 11/23/2021 0051   GLUCOSEU NEGATIVE 11/23/2021 0051   HGBUR SMALL (A) 11/23/2021 0051   BILIRUBINUR NEGATIVE 11/23/2021 0051   KETONESUR NEGATIVE 11/23/2021 0051   PROTEINUR 30 (A) 11/23/2021 0051   NITRITE NEGATIVE 11/23/2021 0051   LEUKOCYTESUR NEGATIVE 11/23/2021 0051   Sepsis Labs: @LABRCNTIP (procalcitonin:4,lacticidven:4)  ) Recent Results (from the past 240 hour(s))  Blood Culture (routine x 2)     Status: None (Preliminary result)   Collection Time: 11/23/21 12:51 AM   Specimen: BLOOD  Result Value Ref Range Status   Specimen Description BLOOD SITE NOT SPECIFIED  Final   Special Requests   Final    BOTTLES DRAWN AEROBIC AND ANAEROBIC Blood Culture adequate volume   Culture   Final    NO GROWTH 1 DAY Performed at [facility], [place] [address],  [place], [place] [address]    Report Status PENDING  Incomplete  Blood Culture (routine x 2)     Status: None (Preliminary result)   Collection Time: 11/23/21 12:56 AM   Specimen: BLOOD LEFT FOREARM  Result Value Ref Range Status   Specimen Description BLOOD LEFT FOREARM  Final   Special Requests   Final    BOTTLES DRAWN AEROBIC AND ANAEROBIC Blood Culture adequate volume   Culture   Final    NO GROWTH 1 DAY Performed at [facility], [place] [address], [place], [place] [address]    Report Status PENDING  Incomplete  Resp Panel by RT-PCR (Flu A&B, Covid) Nasopharyngeal Swab     Status: None   Collection Time: 11/23/21  7:47 AM   Specimen: Nasopharyngeal Swab; Nasopharyngeal(NP) swabs in vial transport medium  Result Value Ref Range Status   SARS Coronavirus 2 by RT PCR NEGATIVE NEGATIVE Final    Comment: (NOTE) SARS-CoV-2 target nucleic acids are NOT DETECTED.  The SARS-CoV-2 RNA is generally detectable in upper respiratory specimens during the acute phase of infection. The lowest concentration of SARS-CoV-2  viral copies this assay can detect is 138 copies/mL. A negative result does not preclude SARS-Cov-2 infection and should not be used as the sole basis for treatment or other patient management decisions. A negative result may occur with  improper specimen collection/handling, submission of specimen other than nasopharyngeal swab, presence of viral mutation(s) within the areas targeted by this assay, and inadequate number of viral copies(<138 copies/mL). A negative result must be combined with clinical observations, patient history, and epidemiological information. The expected result is Negative.  Fact Sheet for Patients:  EntrepreneurPulse.com.au  Fact Sheet for Healthcare Providers:  IncredibleEmployment.be  This test is no t yet approved or cleared by the [place] FDA and  has been authorized for detection and/or diagnosis of  SARS-CoV-2 by FDA under an Emergency Use Authorization (EUA). This EUA will remain  in effect (meaning this test can be used) for the duration of the COVID-19 declaration under Section 564(b)(1) of the Act, 21 U.S.C.section 360bbb-3(b)(1), unless the authorization is terminated  or revoked sooner.       Influenza A by PCR NEGATIVE NEGATIVE Final   Influenza B by PCR NEGATIVE NEGATIVE Final    Comment: (NOTE) The Xpert Xpress SARS-CoV-2/FLU/RSV plus assay is intended as an aid in the diagnosis of influenza from Nasopharyngeal swab specimens and should not be used as a sole basis for treatment. Nasal washings and aspirates are unacceptable for Xpert Xpress SARS-CoV-2/FLU/RSV testing.  Fact Sheet for Patients: EntrepreneurPulse.com.au  Fact Sheet for Healthcare Providers: IncredibleEmployment.be  This test is not yet approved or cleared by the [place] FDA and has been authorized for detection and/or diagnosis of SARS-CoV-2 by FDA under an Emergency Use Authorization (EUA). This EUA will remain in effect (meaning this test can be used) for the duration of the COVID-19 declaration under Section 564(b)(1) of the Act, 21 U.S.C. section 360bbb-3(b)(1), unless the authorization is terminated or revoked.  Performed at [facility], [place] [address], [place], [place] [address]   Respiratory (~20 pathogens) panel by PCR     Status: None   Collection Time: 11/23/21  7:47 AM   Specimen: Nasopharyngeal Swab; Respiratory  Result Value Ref Range Status   Adenovirus NOT DETECTED NOT DETECTED Final   Coronavirus 229E NOT DETECTED NOT DETECTED Final    Comment: (NOTE) The Coronavirus on the Respiratory Panel, DOES NOT test for the novel  Coronavirus (2019 nCoV)    Coronavirus HKU1 NOT DETECTED NOT DETECTED Final   Coronavirus NL63 NOT DETECTED NOT DETECTED Final   Coronavirus OC43 NOT DETECTED NOT DETECTED Final   Metapneumovirus NOT DETECTED  NOT DETECTED Final   Rhinovirus / Enterovirus NOT DETECTED NOT DETECTED Final   Influenza A NOT DETECTED NOT DETECTED Final   Influenza B NOT DETECTED NOT DETECTED Final   Parainfluenza Virus 1 NOT DETECTED NOT DETECTED Final   Parainfluenza Virus 2 NOT DETECTED NOT DETECTED Final   Parainfluenza Virus 3 NOT DETECTED NOT DETECTED Final   Parainfluenza Virus 4 NOT DETECTED NOT DETECTED Final   Respiratory Syncytial Virus NOT DETECTED NOT DETECTED Final   Bordetella pertussis NOT DETECTED NOT DETECTED Final   Bordetella Parapertussis NOT DETECTED NOT DETECTED Final   Chlamydophila pneumoniae NOT DETECTED NOT DETECTED Final   Mycoplasma pneumoniae NOT DETECTED NOT DETECTED Final    Comment: Performed at [facility], [place]. [address] [place], [place] [address]         Radiology Studies: CT ABDOMEN PELVIS W CONTRAST  Result Date: 11/23/2021 CLINICAL DATA:  Sacral wound infection. EXAM: CT ABDOMEN AND PELVIS WITH CONTRAST TECHNIQUE: Multidetector CT imaging of the abdomen and pelvis was performed using the standard protocol following bolus administration of intravenous contrast. CONTRAST:  156mL OMNIPAQUE IOHEXOL 300 MG/ML  SOLN COMPARISON:  August 20, 2021 FINDINGS: Lower chest: Moderate severity atelectasis is seen within the posterior aspect of the bilateral lung bases. There is a very small right pleural effusion. Hepatobiliary: No focal liver abnormality is seen. Status post cholecystectomy. No biliary dilatation. Pancreas: Unremarkable. No pancreatic ductal dilatation or surrounding inflammatory changes. Spleen: A 3.3 cm x 2.1 cm area of low attenuation is seen along the anterior tip of an enlarged spleen. Adrenals/Urinary Tract: Adrenal glands are unremarkable. Kidneys are normal in size, without renal calculi or hydronephrosis. A 1.6 cm diameter cystic appearing area is seen along the upper pole of the left kidney. Urinary bladder is moderately distended. Stomach/Bowel: Stomach is  within normal limits. Appendix appears normal. No evidence of bowel wall thickening, distention, or inflammatory changes. Vascular/Lymphatic: Aortic atherosclerosis. No enlarged abdominal or pelvic lymph nodes. Reproductive: Prostate is unremarkable. Other: No abdominal wall hernia or abnormality. No abdominopelvic ascites. Musculoskeletal: A small superficial soft tissue defect is seen at the level of the lower sacrum, along the midline. A mild amount of associated soft tissue thickening is seen. Mild subcutaneous inflammatory fat stranding is seen along the posteromedial aspect of the gluteal region on the left. No acute osseous abnormality is identified. IMPRESSION: 1. Small superficial sacral also, without evidence of associated fluid collection, abscess or acute osteomyelitis. 2. Mild cellulitis along the posteromedial aspect of the gluteal region on the left. 3. Moderate severity bibasilar atelectasis with a very small right pleural effusion. 4. Splenomegaly with additional findings that may represent sequelae associated with a small splenic infarct. 5. Aortic atherosclerosis. Aortic Atherosclerosis (ICD10-I70.0). Electronically Signed   By: Virgina Norfolk M.D.   On: 11/23/2021 03:14   DG Chest Port 1 View  Result Date: 11/23/2021 CLINICAL DATA:  Fever and decreased appetite. EXAM: PORTABLE CHEST 1 VIEW COMPARISON:  October 06, 2021 FINDINGS: Mild atelectasis is seen within the bilateral lung bases. There is no evidence of acute infiltrate, pleural effusion or pneumothorax. The cardiac silhouette is mildly enlarged and unchanged in size. The visualized skeletal structures are unremarkable. IMPRESSION: Stable cardiomegaly with mild bibasilar atelectasis. Electronically Signed   By: Virgina Norfolk M.D.   On: 11/23/2021 01:10   DG Hand Complete Right  Result Date: 11/23/2021 CLINICAL DATA:  Fall, concern for fracture. EXAM: RIGHT HAND - COMPLETE 3+ VIEW COMPARISON:  None. FINDINGS: There is a  nondisplaced fracture at the base of the proximal phalanx of the third digit. The remaining bony structures appear intact. There is no dislocation. Joint space narrowing and osteophyte formation is noted at the interphalangeal joints and first carpometacarpal joint. Extensive vascular calcifications are present. There is soft tissue swelling over the third metacarpal phalangeal joint. IMPRESSION: Nondisplaced fracture at the base of the proximal phalanx of the third digit. Electronically Signed   By: Brett Fairy M.D.   On: 11/23/2021 03:28        Scheduled Meds:  diphenhydrAMINE  25 mg Oral QHS   ezetimibe  10 mg Oral Daily   ferrous sulfate  325 mg Oral BID WC   gabapentin  300 mg Oral BID   insulin aspart  0-9 Units Subcutaneous TID WC   insulin aspart  3 Units Subcutaneous TID WC   insulin glargine-yfgn  15 Units Subcutaneous Daily  levothyroxine  75 mcg Oral Q0600   mupirocin cream   Topical Daily   pantoprazole  80 mg Oral QHS   simvastatin  40 mg Oral q1800   Continuous Infusions:  sodium chloride 75 mL/hr at 11/24/21 1059   cefTRIAXone (ROCEPHIN)  IV 1 g (11/24/21 0445)   ferric gluconate (FERRLECIT) IVPB 250 mg (11/24/21 1130)   vancomycin 1,250 mg (11/24/21 0213)     LOS: 0 days    Time spent: 61min    Domenic Polite, MD [facility]   11/24/2021, 11:42 AM

## 2021-11-25 DIAGNOSIS — Z7901 Long term (current) use of anticoagulants: Secondary | ICD-10-CM

## 2021-11-25 DIAGNOSIS — D500 Iron deficiency anemia secondary to blood loss (chronic): Secondary | ICD-10-CM

## 2021-11-25 DIAGNOSIS — L03317 Cellulitis of buttock: Secondary | ICD-10-CM | POA: Diagnosis not present

## 2021-11-25 LAB — CBC
HCT: 24.7 % — ABNORMAL LOW (ref 39.0–52.0)
Hemoglobin: 8.3 g/dL — ABNORMAL LOW (ref 13.0–17.0)
MCH: 29.5 pg (ref 26.0–34.0)
MCHC: 33.6 g/dL (ref 30.0–36.0)
MCV: 87.9 fL (ref 80.0–100.0)
Platelets: 200 10*3/uL (ref 150–400)
RBC: 2.81 MIL/uL — ABNORMAL LOW (ref 4.22–5.81)
RDW: 13.9 % (ref 11.5–15.5)
WBC: 2 10*3/uL — ABNORMAL LOW (ref 4.0–10.5)
nRBC: 0 % (ref 0.0–0.2)

## 2021-11-25 LAB — GLUCOSE, CAPILLARY
Glucose-Capillary: 158 mg/dL — ABNORMAL HIGH (ref 70–99)
Glucose-Capillary: 118 mg/dL — ABNORMAL HIGH (ref 70–99)
Glucose-Capillary: 159 mg/dL — ABNORMAL HIGH (ref 70–99)
Glucose-Capillary: 169 mg/dL — ABNORMAL HIGH (ref 70–99)

## 2021-11-25 MED ORDER — NYSTATIN 100000 UNIT/GM EX POWD
Freq: Two times a day (BID) | CUTANEOUS | Status: DC
  Filled 2021-11-25 (×2): qty 15

## 2021-11-25 MED ORDER — SODIUM CHLORIDE 0.9 % IV SOLN: INTRAVENOUS | Status: DC

## 2021-11-25 MED ORDER — INSULIN GLARGINE-YFGN 100 UNIT/ML ~~LOC~~ SOLN
10.0000 [IU] | Freq: Every day | SUBCUTANEOUS | Status: DC
  Filled 2021-11-25: qty 0.1

## 2021-11-25 NOTE — Progress Notes (Signed)
Inpatient Diabetes Program Recommendations  AACE/ADA: New Consensus Statement on Inpatient Glycemic Control (2015)  Target Ranges:  Prepandial:   less than 140 mg/dL      Peak postprandial:   less than 180 mg/dL (1-2 hours)      Critically ill patients:  140 - 180 mg/dL   Lab Results  Component Value Date   GLUCAP 158 (H) 11/25/2021   HGBA1C 6.0 (H) 10/07/2021    Review of Glycemic Control  Latest Reference Range & Units 11/24/21 08:11 11/24/21 11:35 11/24/21 16:30 11/24/21 20:57 11/25/21 07:41  Glucose-Capillary 70 - 99 mg/dL 140 (H) 273 (H) 259 (H) 196 (H) 158 (H)   Diabetes history: type 1 Outpatient Diabetes medications: Insulin pump Insulin: Lispro (Humalog) Pump Brand/Model: Medtronic  Basal Total Daily Basal Insulin: 32.1  Basal Time: 00:00 (1) Basal Rate (Units/Hr): 1.4  Basal Time: 04:00 (2) Basal Rate (Units/Hr): 1.5  Basal Time: 07:00 (3) Basal Rate (Units/Hr): 1.4  Basal Time: 18:00 (4) Basal Rate (Units/Hr): 1.1   Insulin:Carb ICR Time: 00:00 (1) ICR : 9.5 ICR Time: 06:00 (2) ICR : 7 ICR Time: 11:00 (3) ICR: 8  ICR Time: 18:00 (4) ICR: 7.5  ICR Time: 21:00 (5) ICR: 8.5   Sensitivity ISF Time: 00:00 (1) ISF: 20  Target BG Time: 00:00 (1) Target BG (mg/dL): 140  BG Time: 07:30 (2) Target BG (mg/dL): 130  BG Time: 22:00 (3) Target BG (mg/dL): 140  Current orders for Inpatient glycemic control:  Semglee 15 units Daily Novolog 0-9 units tid Novolog 3 units tid meal coverage Ensure enlive bid between meals  Inpatient Diabetes Program Recommendations:    Was notified by RN pt not wanting to take basal insulin for fear of hypoglycemia. Pt had same dose yesterday and never came close to hypoglycemia. Helped to coach the RN in explaining to pt that it is half of his home basal rate dose his insulin pump gives him. We will not push it and monitor glucose trends today. Pt maybe more agreeable to take it after seeing his glucose trends today.  Thanks,  Tama Headings RN, MSN, BC-ADM Inpatient Diabetes Coordinator Team Pager [phone] (8a-5p)

## 2021-11-25 NOTE — Progress Notes (Addendum)
Progress Note   Subjective  Chief Complaint: IDA  Patient tells me he is feeling okay today.  He is not very excited about bowel prep this afternoon, but is aware that his wife would like him to get this done and that he needs to have it done.  Does have questions about ambulating as he needs to use a walker.  Explined that I will confirm with nursing that he gets a bedside commode.  Did discuss with nurse in the room.  He also asked questions about his blood sugars.  Explained that he is in the hospital and they will be able to monitor this closely while he is completing prep.   Objective   Vital signs in last 24 hours: Temp:  [97.8 F (36.6 C)-98.2 F (36.8 C)] 97.8 F (36.6 C) (01/08 0740) Pulse Rate:  [58-69] 68 (01/08 0740) Resp:  [17-18] 18 (01/08 0740) BP: (151-163)/(66-74) 159/69 (01/08 0740) SpO2:  [94 %-99 %] 94 % (01/08 0740) Last BM Date: 11/22/21 General:    white male in NAD Heart:  Regular rate and rhythm; no murmurs Lungs: Respirations even and unlabored, lungs CTA bilaterally Abdomen:  Soft, nontender and nondistended. Normal bowel sounds. Psych:  Cooperative. Normal mood and affect.  Intake/Output from previous day: 01/07 0701 - 01/08 0700 In: 630 [P.O.:360; IV Piggyback:270] Out: 1950 [Urine:1950]   Lab Results: Recent Labs    11/23/21 0051 11/24/21 0102 11/25/21 0748  WBC 1.8* 1.5* 2.0*  HGB 8.4* 7.8* 8.3*  HCT 25.7* 23.4* 24.7*  PLT 203 187 200   BMET Recent Labs    11/23/21 0051 11/24/21 0102  NA 130* 135  K 3.5 3.8  CL 101 107  CO2 22 22  GLUCOSE 105* 80  BUN 19 17  CREATININE 1.56* 1.38*  CALCIUM 7.6* 7.9*   LFT Recent Labs    11/23/21 0051  PROT 6.5  ALBUMIN 2.4*  AST 25  ALT 16  ALKPHOS 59  BILITOT 0.8   PT/INR Recent Labs    11/23/21 0051 11/24/21 1338  LABPROT 35.5* 18.1*  INR 3.5* 1.5*    Assessment / Plan:   Assessment: 1.  Iron deficiency anemia: Noticed in November, Hemoccult negative on 1/6, previous  EGD unrevealing in November, recommendations for colonoscopy which had not been completed, has been on p.o. iron (held now for colonoscopy), hemoglobin is remained stable this hospitalization; consider lower GI bleed or small bowel source versus chronic 2.  Cellulitis of the buttock: Chronic cellulitis with decubitus ulcers 3.  PAF: On Xarelto-last dose 11/23/2021 4.  Diabetes 5.  CKD stage III  Plan: 1.  Rechecked INR this morning which has come down to 1.5.  Last dose of Xarelto 11/23/2021, given his renal insufficiency have scheduled procedure for tomorrow to allow for adequate washout. 2.  Movi prep will be started this afternoon in split dose fashion. 3.  Patient on a clear liquid diet today and will be n.p.o. at midnight 4.  Confirmed with nursing that they will have a bedside commode and monitor his blood sugars 5.  Answered all of the patient's other questions about procedure. 6.  Patient is scheduled for colonoscopy with Dr. Hilarie Fredrickson tomorrow morning.  Thank you for your kind consultation, Dr. Hilarie Fredrickson will take over the service tomorrow.   LOS: 1 day   Levin Erp  11/25/2021, 10:28 AM  GI ATTENDING  Interval history data reviewed.  Patient seen and examined.  No interval clinical change.  Xarelto washing out.  Plans for colonoscopy tomorrow to complete work-up of iron deficiency anemia.the patient is high risk given his comorbid conditions.  The nature of the procedure, as well as the risks, benefits, and alternatives were carefully and thoroughly reviewed with the patient. Ample time for discussion and questions allowed. The patient understood, was satisfied, and agreed to proceed.  Docia Chuck. Geri Seminole., M.D. [facility] Division of Gastroenterology

## 2021-11-25 NOTE — Progress Notes (Signed)
PROGRESS NOTE    Jeremiah Beasley.  XBD:[id number] DOB: [birth year] DOA: 11/23/2021 PCP: [name], MD  Brief Narrative:Jeremiah Beasley is a 75/M with history of paroxysmal atrial fibrillation on Xarelto, insulin-dependent diabetes, CKD stage IIIa, iron deficiency anemia, with poor functional status and progressive decline since December 2021.  He was hospitalized in September with sacral decubitus wounds, cellulitis requiring antibiotics and inpatient admission. -Continue to have failure to thrive, minimally ambulatory at baseline. -Presented to the ED 1/6 with fever, decreased appetite, per EMS temp was 104.  Also has a low-grade cough.  In the ED temp was 100.7, WBC was 1.8, hemoglobin 8.4 down from 10.4 in December   Assessment & Plan:   Fever, SIRS -Has superficial sacral decubitus wounds and mild cellulitis on CT, physical exam not very impressive -Resolved, afebrile now -COVID, influenza and respiratory virus panels are negative -Continue empiric Rocephin, discontinue vancomycin, blood cultures are negative  -Discontinued IV fluids -, Transition to oral Keflex tomorrow for 3 more days   Iron deficiency anemia -Diagnosed 2 months ago -on Xarelto for A. fib, had an EGD in November which was unrevealing, colonoscopy has been pending -Downtrend in hemoglobin noted, started IV iron yesterday, holding [facility] input, plan for colonoscopy tomorrow -CBC in a.m.   Paroxysmal atrial fibrillation -Holding Xarelto today   Type 1 diabetes mellitus -Insulin pump discontinued while inpatient, resume at discharge, continue Semglee, meal coverage and sliding scale -Decreasing the dose for tomorrow, he will be n.p.o. for colonoscopy  Diabetic peripheral neuropathy -Continue gabapentin   Hypertension -BP stable, hydralazine on hold   Sacral decubitus wounds -WOC consult, frequent repositioning   Moderate protein calorie malnutrition   CKD 3a -Creatinine at  baseline  DVT prophylaxis: SCDs Code Status: Limited code Family Communication: Wife at bedside yesterday Disposition Plan:  Status is: Inpatient  Consultants:  Gastroenterology  Procedures:   Antimicrobials:    Subjective: -Feels okay, no events overnight, no further fevers, spoke to gastroenterology yesterday, starting prep today for colonoscopy  Objective: Vitals:   11/24/21 1443 11/24/21 2100 11/25/21 0452 11/25/21 0740  BP: (!) 163/66 (!) 151/66 (!) 158/74 (!) 159/69  Pulse: (!) 58 (!) 58 69 68  Resp: 17 18 18 18   Temp: 98.1 F (36.7 C) 98.2 F (36.8 C) 98.2 F (36.8 C) 97.8 F (36.6 C)  TempSrc: Oral Oral Oral Oral  SpO2: 98% 98% 99% 94%    Intake/Output Summary (Last 24 hours) at 11/25/2021 1339 Last data filed at 11/25/2021 0930 Gross per 24 hour  Intake 270 ml  Output 2610 ml  Net -2340 ml   There were no vitals filed for this visit.  Examination:  Pleasant male, laying in bed, AAOx3, nondistressed HEENT: No JVD CVS: S1-S2, regular rate rhythm Lungs: Clear bilaterally Abdomen: Soft, nontender, bowel sounds present Extremities: No edema  Skin: Small superficial sacral decubitus wound Psychiatry: Judgement and insight appear normal. Mood & affect appropriate.     Data Reviewed:   CBC: Recent Labs  Lab 11/23/21 0051 11/24/21 0102 11/25/21 0748  WBC 1.8* 1.5* 2.0*  NEUTROABS 1.2*  --   --   HGB 8.4* 7.8* 8.3*  HCT 25.7* 23.4* 24.7*  MCV 89.9 88.6 87.9  PLT 203 187 834   Basic Metabolic Panel: Recent Labs  Lab 11/23/21 0051 11/24/21 0102  NA 130* 135  K 3.5 3.8  CL 101 107  CO2 22 22  GLUCOSE 105* 80  BUN 19 17  CREATININE 1.56* 1.38*  CALCIUM 7.6*  7.9*   GFR: CrCl cannot be calculated (Unknown ideal weight.). Liver Function Tests: Recent Labs  Lab 11/23/21 0051  AST 25  ALT 16  ALKPHOS 59  BILITOT 0.8  PROT 6.5  ALBUMIN 2.4*   No results for input(s): LIPASE, AMYLASE in the last 168 hours. No results for input(s):  AMMONIA in the last 168 hours. Coagulation Profile: Recent Labs  Lab 11/23/21 0051 11/24/21 1338  INR 3.5* 1.5*   Cardiac Enzymes: No results for input(s): CKTOTAL, CKMB, CKMBINDEX, TROPONINI in the last 168 hours. BNP (last 3 results) No results for input(s): PROBNP in the last 8760 hours. HbA1C: No results for input(s): HGBA1C in the last 72 hours. CBG: Recent Labs  Lab 11/24/21 0811 11/24/21 1135 11/24/21 1630 11/24/21 2057 11/25/21 0741  GLUCAP 140* 273* 259* 196* 158*   Lipid Profile: No results for input(s): CHOL, HDL, LDLCALC, TRIG, CHOLHDL, LDLDIRECT in the last 72 hours. Thyroid Function Tests: No results for input(s): TSH, T4TOTAL, FREET4, T3FREE, THYROIDAB in the last 72 hours. Anemia Panel: Recent Labs    11/24/21 0102  VITAMINB12 306  FOLATE 15.6  FERRITIN 94  TIBC 153*  IRON 32*  RETICCTPCT 2.4   Urine analysis:    Component Value Date/Time   COLORURINE YELLOW 11/23/2021 0051   APPEARANCEUR CLEAR 11/23/2021 0051   LABSPEC 1.011 11/23/2021 0051   PHURINE 5.0 11/23/2021 0051   GLUCOSEU NEGATIVE 11/23/2021 0051   HGBUR SMALL (A) 11/23/2021 0051   BILIRUBINUR NEGATIVE 11/23/2021 0051   KETONESUR NEGATIVE 11/23/2021 0051   PROTEINUR 30 (A) 11/23/2021 0051   NITRITE NEGATIVE 11/23/2021 0051   LEUKOCYTESUR NEGATIVE 11/23/2021 0051   Sepsis Labs: @LABRCNTIP (procalcitonin:4,lacticidven:4)  ) Recent Results (from the past 240 hour(s))  Blood Culture (routine x 2)     Status: None (Preliminary result)   Collection Time: 11/23/21 12:51 AM   Specimen: BLOOD  Result Value Ref Range Status   Specimen Description BLOOD SITE NOT SPECIFIED  Final   Special Requests   Final    BOTTLES DRAWN AEROBIC AND ANAEROBIC Blood Culture adequate volume   Culture   Final    NO GROWTH 2 DAYS Performed at [facility], [place] [address], [place], [place] [address]    Report Status PENDING  Incomplete  Urine Culture     Status: Abnormal (Preliminary result)    Collection Time: 11/23/21 12:51 AM   Specimen: In/Out Cath Urine  Result Value Ref Range Status   Specimen Description IN/OUT CATH URINE  Final   Special Requests NONE  Final   Culture (A)  Final    200 COLONIES/mL ENTEROCOCCUS FAECALIS CULTURE REINCUBATED FOR BETTER GROWTH Performed at [facility], [place] [address], [place], [place] [address]    Report Status PENDING  Incomplete  Blood Culture (routine x 2)     Status: None (Preliminary result)   Collection Time: 11/23/21 12:56 AM   Specimen: BLOOD LEFT FOREARM  Result Value Ref Range Status   Specimen Description BLOOD LEFT FOREARM  Final   Special Requests   Final    BOTTLES DRAWN AEROBIC AND ANAEROBIC Blood Culture adequate volume   Culture   Final    NO GROWTH 2 DAYS Performed at [facility], [place] [address] [place], [place] [address]    Report Status PENDING  Incomplete  Resp Panel by RT-PCR (Flu A&B, Covid) Nasopharyngeal Swab     Status: None   Collection Time: 11/23/21  7:47 AM   Specimen: Nasopharyngeal Swab; Nasopharyngeal(NP)  swabs in vial transport medium  Result Value Ref Range Status   SARS Coronavirus 2 by RT PCR NEGATIVE NEGATIVE Final    Comment: (NOTE) SARS-CoV-2 target nucleic acids are NOT DETECTED.  The SARS-CoV-2 RNA is generally detectable in upper respiratory specimens during the acute phase of infection. The lowest concentration of SARS-CoV-2 viral copies this assay can detect is 138 copies/mL. A negative result does not preclude SARS-Cov-2 infection and should not be used as the sole basis for treatment or other patient management decisions. A negative result may occur with  improper specimen collection/handling, submission of specimen other than nasopharyngeal swab, presence of viral mutation(s) within the areas targeted by this assay, and inadequate number of viral copies(<138 copies/mL). A negative result must be combined with clinical observations, patient history, and  epidemiological information. The expected result is Negative.  Fact Sheet for Patients:  EntrepreneurPulse.com.au  Fact Sheet for Healthcare Providers:  IncredibleEmployment.be  This test is no t yet approved or cleared by the [place] FDA and  has been authorized for detection and/or diagnosis of SARS-CoV-2 by FDA under an Emergency Use Authorization (EUA). This EUA will remain  in effect (meaning this test can be used) for the duration of the COVID-19 declaration under Section 564(b)(1) of the Act, 21 U.S.C.section 360bbb-3(b)(1), unless the authorization is terminated  or revoked sooner.       Influenza A by PCR NEGATIVE NEGATIVE Final   Influenza B by PCR NEGATIVE NEGATIVE Final    Comment: (NOTE) The Xpert Xpress SARS-CoV-2/FLU/RSV plus assay is intended as an aid in the diagnosis of influenza from Nasopharyngeal swab specimens and should not be used as a sole basis for treatment. Nasal washings and aspirates are unacceptable for Xpert Xpress SARS-CoV-2/FLU/RSV testing.  Fact Sheet for Patients: EntrepreneurPulse.com.au  Fact Sheet for Healthcare Providers: IncredibleEmployment.be  This test is not yet approved or cleared by the [place] FDA and has been authorized for detection and/or diagnosis of SARS-CoV-2 by FDA under an Emergency Use Authorization (EUA). This EUA will remain in effect (meaning this test can be used) for the duration of the COVID-19 declaration under Section 564(b)(1) of the Act, 21 U.S.C. section 360bbb-3(b)(1), unless the authorization is terminated or revoked.  Performed at [facility], [place] [address], [place], [place] [address]   Respiratory (~20 pathogens) panel by PCR     Status: None   Collection Time: 11/23/21  7:47 AM   Specimen: Nasopharyngeal Swab; Respiratory  Result Value Ref Range Status   Adenovirus NOT DETECTED NOT DETECTED Final    Coronavirus 229E NOT DETECTED NOT DETECTED Final    Comment: (NOTE) The Coronavirus on the Respiratory Panel, DOES NOT test for the novel  Coronavirus (2019 nCoV)    Coronavirus HKU1 NOT DETECTED NOT DETECTED Final   Coronavirus NL63 NOT DETECTED NOT DETECTED Final   Coronavirus OC43 NOT DETECTED NOT DETECTED Final   Metapneumovirus NOT DETECTED NOT DETECTED Final   Rhinovirus / Enterovirus NOT DETECTED NOT DETECTED Final   Influenza A NOT DETECTED NOT DETECTED Final   Influenza B NOT DETECTED NOT DETECTED Final   Parainfluenza Virus 1 NOT DETECTED NOT DETECTED Final   Parainfluenza Virus 2 NOT DETECTED NOT DETECTED Final   Parainfluenza Virus 3 NOT DETECTED NOT DETECTED Final   Parainfluenza Virus 4 NOT DETECTED NOT DETECTED Final   Respiratory Syncytial Virus NOT DETECTED NOT DETECTED Final   Bordetella pertussis NOT DETECTED NOT DETECTED Final   Bordetella Parapertussis NOT DETECTED NOT DETECTED Final  Chlamydophila pneumoniae NOT DETECTED NOT DETECTED Final   Mycoplasma pneumoniae NOT DETECTED NOT DETECTED Final    Comment: Performed at [facility], [place] [address], [place], [place] [address]    Scheduled Meds:  (feeding supplement) PROSource Plus  30 mL Oral BID BM   diphenhydrAMINE  25 mg Oral QHS   ezetimibe  10 mg Oral Daily   feeding supplement  237 mL Oral BID WC   ferrous sulfate  325 mg Oral BID WC   gabapentin  300 mg Oral BID   insulin aspart  0-9 Units Subcutaneous TID WC   insulin aspart  3 Units Subcutaneous TID WC   insulin glargine-yfgn  15 Units Subcutaneous Daily   levothyroxine  75 mcg Oral Q0600   multivitamin with minerals  1 tablet Oral Daily   mupirocin cream   Topical Daily   pantoprazole  80 mg Oral QHS   peg 3350 powder  0.5 kit Oral Once   And   peg 3350 powder  0.5 kit Oral Once   simvastatin  40 mg Oral q1800   Continuous Infusions:  cefTRIAXone (ROCEPHIN)  IV 1 g (11/25/21 0548)     LOS: 1 day    Time spent: 25mn  PDomenic Polite MD [facility]   11/25/2021, 1:39 PM

## 2021-11-25 NOTE — Plan of Care (Signed)
  Problem: Pain Managment: Goal: General experience of comfort will improve Outcome: Progressing   Problem: Nutrition: Goal: Adequate nutrition will be maintained Outcome: Not Progressing   

## 2021-11-25 NOTE — H&P (View-Only) (Signed)
Progress Note   Subjective  Chief Complaint: IDA  Patient tells me he is feeling okay today.  He is not very excited about bowel prep this afternoon, but is aware that his wife would like him to get this done and that he needs to have it done.  Does have questions about ambulating as he needs to use a walker.  Explined that I will confirm with nursing that he gets a bedside commode.  Did discuss with nurse in the room.  He also asked questions about his blood sugars.  Explained that he is in the hospital and they will be able to monitor this closely while he is completing prep.   Objective   Vital signs in last 24 hours: Temp:  [97.8 F (36.6 C)-98.2 F (36.8 C)] 97.8 F (36.6 C) (01/08 0740) Pulse Rate:  [58-69] 68 (01/08 0740) Resp:  [17-18] 18 (01/08 0740) BP: (151-163)/(66-74) 159/69 (01/08 0740) SpO2:  [94 %-99 %] 94 % (01/08 0740) Last BM Date: 11/22/21 General:    white male in NAD Heart:  Regular rate and rhythm; no murmurs Lungs: Respirations even and unlabored, lungs CTA bilaterally Abdomen:  Soft, nontender and nondistended. Normal bowel sounds. Psych:  Cooperative. Normal mood and affect.  Intake/Output from previous day: 01/07 0701 - 01/08 0700 In: 630 [P.O.:360; IV Piggyback:270] Out: 1950 [Urine:1950]   Lab Results: Recent Labs    11/23/21 0051 11/24/21 0102 11/25/21 0748  WBC 1.8* 1.5* 2.0*  HGB 8.4* 7.8* 8.3*  HCT 25.7* 23.4* 24.7*  PLT 203 187 200   BMET Recent Labs    11/23/21 0051 11/24/21 0102  NA 130* 135  K 3.5 3.8  CL 101 107  CO2 22 22  GLUCOSE 105* 80  BUN 19 17  CREATININE 1.56* 1.38*  CALCIUM 7.6* 7.9*   LFT Recent Labs    11/23/21 0051  PROT 6.5  ALBUMIN 2.4*  AST 25  ALT 16  ALKPHOS 59  BILITOT 0.8   PT/INR Recent Labs    11/23/21 0051 11/24/21 1338  LABPROT 35.5* 18.1*  INR 3.5* 1.5*    Assessment / Plan:   Assessment: 1.  Iron deficiency anemia: Noticed in November, Hemoccult negative on 1/6, previous  EGD unrevealing in November, recommendations for colonoscopy which had not been completed, has been on p.o. iron (held now for colonoscopy), hemoglobin is remained stable this hospitalization; consider lower GI bleed or small bowel source versus chronic 2.  Cellulitis of the buttock: Chronic cellulitis with decubitus ulcers 3.  PAF: On Xarelto-last dose 11/23/2021 4.  Diabetes 5.  CKD stage III  Plan: 1.  Rechecked INR this morning which has come down to 1.5.  Last dose of Xarelto 11/23/2021, given his renal insufficiency have scheduled procedure for tomorrow to allow for adequate washout. 2.  Movi prep will be started this afternoon in split dose fashion. 3.  Patient on a clear liquid diet today and will be n.p.o. at midnight 4.  Confirmed with nursing that they will have a bedside commode and monitor his blood sugars 5.  Answered all of the patient's other questions about procedure. 6.  Patient is scheduled for colonoscopy with Dr. Hilarie Fredrickson tomorrow morning.  Thank you for your kind consultation, Dr. Hilarie Fredrickson will take over the service tomorrow.   LOS: 1 day   Levin Erp  11/25/2021, 10:28 AM  GI ATTENDING  Interval history data reviewed.  Patient seen and examined.  No interval clinical change.  Xarelto washing out.  Plans for colonoscopy tomorrow to complete work-up of iron deficiency anemia.the patient is high risk given his comorbid conditions.  The nature of the procedure, as well as the risks, benefits, and alternatives were carefully and thoroughly reviewed with the patient. Ample time for discussion and questions allowed. The patient understood, was satisfied, and agreed to proceed.  Docia Chuck. Geri Seminole., M.D. [facility] Division of Gastroenterology

## 2021-11-25 NOTE — Anesthesia Preprocedure Evaluation (Addendum)
Anesthesia Evaluation  Patient identified by MRN, date of birth, ID band Patient awake    Reviewed: Allergy & Precautions, NPO status , Patient's Chart, lab work & pertinent test results  Airway Mallampati: II  TM Distance: >3 FB Neck ROM: Full    Dental  (+) Dental Advisory Given, Upper Dentures   Pulmonary neg pulmonary ROS,    Pulmonary exam normal breath sounds clear to auscultation       Cardiovascular hypertension, Pt. on medications Normal cardiovascular exam+ dysrhythmias Atrial Fibrillation  Rhythm:Regular Rate:Normal     Neuro/Psych negative neurological ROS  negative psych ROS   GI/Hepatic negative GI ROS, Neg liver ROS,   Endo/Other  diabetes, Type 1, Insulin Dependent  Renal/GU Renal InsufficiencyRenal disease     Musculoskeletal  (+) Arthritis , Osteoarthritis,    Abdominal   Peds  Hematology  (+) Blood dyscrasia (Xarelto), anemia ,   Anesthesia Other Findings   Reproductive/Obstetrics                            Anesthesia Physical Anesthesia Plan  ASA: 3  Anesthesia Plan: MAC   Post-op Pain Management: Minimal or no pain anticipated   Induction: Intravenous  PONV Risk Score and Plan: 1 and TIVA and Treatment may vary due to age or medical condition  Airway Management Planned: Natural Airway and Nasal Cannula  Additional Equipment:   Intra-op Plan:   Post-operative Plan:   Informed Consent: I have reviewed the patients History and Physical, chart, labs and discussed the procedure including the risks, benefits and alternatives for the proposed anesthesia with the patient or authorized representative who has indicated his/her understanding and acceptance.     Dental advisory given  Plan Discussed with: CRNA  Anesthesia Plan Comments:        Anesthesia Quick Evaluation

## 2021-11-26 ENCOUNTER — Telehealth: Payer: Self-pay

## 2021-11-26 ENCOUNTER — Inpatient Hospital Stay (HOSPITAL_COMMUNITY): Payer: Medicare Other | Admitting: Anesthesiology

## 2021-11-26 ENCOUNTER — Encounter (HOSPITAL_COMMUNITY): Payer: Self-pay | Admitting: Internal Medicine

## 2021-11-26 ENCOUNTER — Encounter (HOSPITAL_COMMUNITY)
Admission: EM | Disposition: A | Discharge status: Home / Self Care | Payer: Self-pay | Source: Home / Self Care | Attending: Internal Medicine

## 2021-11-26 DIAGNOSIS — D122 Benign neoplasm of ascending colon: Secondary | ICD-10-CM

## 2021-11-26 DIAGNOSIS — D500 Iron deficiency anemia secondary to blood loss (chronic): Secondary | ICD-10-CM | POA: Diagnosis not present

## 2021-11-26 DIAGNOSIS — D509 Iron deficiency anemia, unspecified: Secondary | ICD-10-CM | POA: Diagnosis present

## 2021-11-26 DIAGNOSIS — L89159 Pressure ulcer of sacral region, unspecified stage: Secondary | ICD-10-CM | POA: Diagnosis present

## 2021-11-26 DIAGNOSIS — D123 Benign neoplasm of transverse colon: Secondary | ICD-10-CM

## 2021-11-26 DIAGNOSIS — Z7901 Long term (current) use of anticoagulants: Secondary | ICD-10-CM | POA: Diagnosis not present

## 2021-11-26 DIAGNOSIS — R6510 Systemic inflammatory response syndrome (SIRS) of non-infectious origin without acute organ dysfunction: Secondary | ICD-10-CM | POA: Diagnosis present

## 2021-11-26 DIAGNOSIS — L03317 Cellulitis of buttock: Secondary | ICD-10-CM | POA: Diagnosis not present

## 2021-11-26 DIAGNOSIS — E1140 Type 2 diabetes mellitus with diabetic neuropathy, unspecified: Secondary | ICD-10-CM | POA: Diagnosis present

## 2021-11-26 HISTORY — PX: HEMOSTASIS CONTROL: SHX6838

## 2021-11-26 HISTORY — PX: COLONOSCOPY WITH PROPOFOL: SHX5780

## 2021-11-26 HISTORY — PX: POLYPECTOMY: SHX5525

## 2021-11-26 LAB — URINE CULTURE: Culture: 200 — AB

## 2021-11-26 LAB — GLUCOSE, CAPILLARY
Glucose-Capillary: 197 mg/dL — ABNORMAL HIGH (ref 70–99)
Glucose-Capillary: 196 mg/dL — ABNORMAL HIGH (ref 70–99)
Glucose-Capillary: 205 mg/dL — ABNORMAL HIGH (ref 70–99)
Glucose-Capillary: 232 mg/dL — ABNORMAL HIGH (ref 70–99)
Glucose-Capillary: 294 mg/dL — ABNORMAL HIGH (ref 70–99)
Glucose-Capillary: 266 mg/dL — ABNORMAL HIGH (ref 70–99)

## 2021-11-26 LAB — CBC
HCT: 27.7 % — ABNORMAL LOW (ref 39.0–52.0)
Hemoglobin: 9 g/dL — ABNORMAL LOW (ref 13.0–17.0)
MCH: 29.5 pg (ref 26.0–34.0)
MCHC: 32.5 g/dL (ref 30.0–36.0)
MCV: 90.8 fL (ref 80.0–100.0)
Platelets: 195 10*3/uL (ref 150–400)
RBC: 3.05 MIL/uL — ABNORMAL LOW (ref 4.22–5.81)
RDW: 13.9 % (ref 11.5–15.5)
WBC: 2 10*3/uL — ABNORMAL LOW (ref 4.0–10.5)
nRBC: 0 % (ref 0.0–0.2)

## 2021-11-26 LAB — BASIC METABOLIC PANEL
Anion gap: 11 (ref 5–15)
BUN: 20 mg/dL (ref 8–23)
CO2: 22 mmol/L (ref 22–32)
Calcium: 8.7 mg/dL — ABNORMAL LOW (ref 8.9–10.3)
Chloride: 108 mmol/L (ref 98–111)
Creatinine, Ser: 1.6 mg/dL — ABNORMAL HIGH (ref 0.61–1.24)
GFR, Estimated: 45 mL/min — ABNORMAL LOW (ref >60)
Glucose, Bld: 219 mg/dL — ABNORMAL HIGH (ref 70–99)
Potassium: 4.8 mmol/L (ref 3.5–5.1)
Sodium: 141 mmol/L (ref 135–145)

## 2021-11-26 SURGERY — COLONOSCOPY WITH PROPOFOL
Anesthesia: Monitor Anesthesia Care

## 2021-11-26 MED ORDER — SODIUM CHLORIDE 0.9 % IV SOLN
250.0000 mg | Freq: Every day | INTRAVENOUS | Status: AC
  Administered 2021-11-26 – 2021-11-27 (×2): 250 mg via INTRAVENOUS
  Filled 2021-11-26 (×2): qty 20

## 2021-11-26 MED ORDER — INSULIN GLARGINE-YFGN 100 UNIT/ML ~~LOC~~ SOLN
15.0000 [IU] | Freq: Every day | SUBCUTANEOUS | Status: DC
  Administered 2021-11-26 – 2021-11-27 (×2): 15 [IU] via SUBCUTANEOUS
  Filled 2021-11-26 (×2): qty 0.15

## 2021-11-26 MED ORDER — PROPOFOL 10 MG/ML IV BOLUS
INTRAVENOUS | Status: DC | PRN
Start: 2021-11-26 — End: 2021-11-26
  Administered 2021-11-26: 15 mg via INTRAVENOUS
  Administered 2021-11-26: 30 mg via INTRAVENOUS

## 2021-11-26 MED ORDER — LIDOCAINE 2% (20 MG/ML) 5 ML SYRINGE
INTRAMUSCULAR | Status: DC | PRN
  Administered 2021-11-26: 40 mg via INTRAVENOUS

## 2021-11-26 MED ORDER — LACTATED RINGERS IV SOLN
INTRAVENOUS | Status: AC | PRN
  Administered 2021-11-26: 1000 mL via INTRAVENOUS

## 2021-11-26 MED ORDER — PROPOFOL 500 MG/50ML IV EMUL
INTRAVENOUS | Status: DC | PRN
  Administered 2021-11-26: 75 ug/kg/min via INTRAVENOUS
  Administered 2021-11-26: 80 ug/kg/min via INTRAVENOUS

## 2021-11-26 MED ORDER — INSULIN ASPART 100 UNIT/ML IJ SOLN
5.0000 [IU] | Freq: Once | INTRAMUSCULAR | Status: AC
  Administered 2021-11-26: 5 [IU] via SUBCUTANEOUS

## 2021-11-26 SURGICAL SUPPLY — 22 items

## 2021-11-26 NOTE — Hospital Course (Signed)
Jeremiah Beasley is a 75/M with history of paroxysmal atrial fibrillation on Xarelto, insulin-dependent diabetes, CKD stage IIIa, iron deficiency anemia, with poor functional status and progressive decline since December 2021.  He was hospitalized in September with sacral decubitus wounds, cellulitis requiring antibiotics and inpatient admission. -Continue to have failure to thrive, minimally ambulatory at baseline. -Presented to the ED 1/6 with fever, decreased appetite, per EMS temp was 104.  Also has a low-grade cough.  In the ED temp was 100.7, WBC was 1.8, hemoglobin 8.4 down from 10.4 in December. -Diagnosed with cellulitis, improving on antibiotics [facility] course is worsening and deficiency anemia, gastroenterology consulted

## 2021-11-26 NOTE — Progress Notes (Signed)
PROGRESS NOTE    Jeremiah Beasley.  UMP:[id number] DOB: November 27, 1945 DOA: 11/23/2021 PCP: Jeremiah Roger, MD  Brief Narrative:Jeremiah Beasley is a 75/M with history of paroxysmal atrial fibrillation on Xarelto, insulin-dependent diabetes, CKD stage IIIa, iron deficiency anemia, with poor functional status and progressive decline since December 2021.  He was hospitalized in September with sacral decubitus wounds, cellulitis requiring antibiotics and inpatient admission. -Continue to have failure to thrive, minimally ambulatory at baseline. -Presented to the ED 1/6 with fever, decreased appetite, per EMS temp was 104.  Also has a low-grade cough.  In the ED temp was 100.7, WBC was 1.8, hemoglobin 8.4 down from 10.4 in December   Assessment & Plan:   Fever, SIRS -Has superficial sacral decubitus wounds and mild cellulitis on CT, physical exam not very impressive -Resolved, afebrile now -COVID, influenza and respiratory virus panels are negative -Continue empiric Rocephin, discontinue vancomycin, blood cultures are negative  -Discontinued IV fluids -Transition to oral Keflex today   Iron deficiency anemia -Diagnosed 2 months ago -on Xarelto for A. fib, had an EGD in November which was unrevealing, colonoscopy has been pending -Downtrend in hemoglobin noted, started IV iron, holding [facility] gastro[facility] input, for colonoscopy today, -May need capsule study if work-up is unrevealing -CBC in a.m.   Paroxysmal atrial fibrillation -Holding Xarelto today   Type 1 diabetes mellitus -Insulin pump discontinued while inpatient, resume at discharge, continue Semglee, meal coverage and sliding scale -Increase Semglee dose, continue meal coverage insulin  Diabetic peripheral neuropathy -Continue gabapentin   Hypertension -BP stable, hydralazine on hold   Sacral decubitus wounds -WOC consult, frequent repositioning   Moderate protein calorie malnutrition   CKD 3a -Creatinine at  baseline  DVT prophylaxis: SCDs Code Status: Limited code Family Communication: Wife at bedside  Disposition Plan:  Status is: Inpatient  Consultants:  Gastroenterology  Procedures:   Antimicrobials:    Subjective: -Feels okay, no events overnight, no further fevers, spoke to gastroenterology yesterday, starting prep today for colonoscopy  Objective: Vitals:   11/26/21 0929 11/26/21 1041 11/26/21 1055 11/26/21 1111  BP: (!) 193/71 (!) 141/67 (!) 168/71 (!) 163/55  Pulse: 63 (!) 53 (!) 51 (!) 52  Resp: (!) 24 15 16 17   Temp: 98.2 F (36.8 C) 97.8 F (36.6 C)  (!) 97.4 F (36.3 C)  TempSrc: Temporal     SpO2: 99% 99% 99% 100%  Weight: 83.2 kg     Height: 5' 9.5" (1.765 m)       Intake/Output Summary (Last 24 hours) at 11/26/2021 1401 Last data filed at 11/26/2021 1034 Gross per 24 hour  Intake 1110 ml  Output 330 ml  Net 780 ml   Filed Weights   11/26/21 0929  Weight: 83.2 kg    Examination:  Pleasant male, laying in bed, AAOx3, nondistressed HEENT: No JVD CVS: S1-S2, regular rate rhythm Lungs: Clear bilaterally Abdomen: Soft, nontender, bowel sounds present Extremities: No edema  Skin: Small superficial sacral decubitus wound Psychiatry: Judgement and insight appear normal. Mood & affect appropriate.     Data Reviewed:   CBC: Recent Labs  Lab 11/23/21 0051 11/24/21 0102 11/25/21 0748 11/26/21 0153  WBC 1.8* 1.5* 2.0* 2.0*  NEUTROABS 1.2*  --   --   --   HGB 8.4* 7.8* 8.3* 9.0*  HCT 25.7* 23.4* 24.7* 27.7*  MCV 89.9 88.6 87.9 90.8  PLT 203 187 200 400   Basic Metabolic Panel: Recent Labs  Lab 11/23/21 0051 11/24/21 0102 11/26/21 0153  NA  130* 135 141  K 3.5 3.8 4.8  CL 101 107 108  CO2 22 22 22   GLUCOSE 105* 80 219*  BUN 19 17 20   CREATININE 1.56* 1.38* 1.60*  CALCIUM 7.6* 7.9* 8.7*   GFR: Estimated Creatinine Clearance: 41.8 mL/min (A) (by C-G formula based on SCr of 1.6 mg/dL (H)). Liver Function Tests: Recent Labs  Lab  11/23/21 0051  AST 25  ALT 16  ALKPHOS 59  BILITOT 0.8  PROT 6.5  ALBUMIN 2.4*   No results for input(s): LIPASE, AMYLASE in the last 168 hours. No results for input(s): AMMONIA in the last 168 hours. Coagulation Profile: Recent Labs  Lab 11/23/21 0051 11/24/21 1338  INR 3.5* 1.5*   Cardiac Enzymes: No results for input(s): CKTOTAL, CKMB, CKMBINDEX, TROPONINI in the last 168 hours. BNP (last 3 results) No results for input(s): PROBNP in the last 8760 hours. HbA1C: No results for input(s): HGBA1C in the last 72 hours. CBG: Recent Labs  Lab 11/25/21 2053 11/26/21 0446 11/26/21 0851 11/26/21 1041 11/26/21 1227  GLUCAP 169* 197* 196* 205* 232*   Lipid Profile: No results for input(s): CHOL, HDL, LDLCALC, TRIG, CHOLHDL, LDLDIRECT in the last 72 hours. Thyroid Function Tests: No results for input(s): TSH, T4TOTAL, FREET4, T3FREE, THYROIDAB in the last 72 hours. Anemia Panel: Recent Labs    11/24/21 0102  VITAMINB12 306  FOLATE 15.6  FERRITIN 94  TIBC 153*  IRON 32*  RETICCTPCT 2.4   Urine analysis:    Component Value Date/Time   COLORURINE YELLOW 11/23/2021 0051   APPEARANCEUR CLEAR 11/23/2021 0051   LABSPEC 1.011 11/23/2021 0051   PHURINE 5.0 11/23/2021 0051   GLUCOSEU NEGATIVE 11/23/2021 0051   HGBUR SMALL (A) 11/23/2021 0051   BILIRUBINUR NEGATIVE 11/23/2021 0051   KETONESUR NEGATIVE 11/23/2021 0051   PROTEINUR 30 (A) 11/23/2021 0051   NITRITE NEGATIVE 11/23/2021 0051   LEUKOCYTESUR NEGATIVE 11/23/2021 0051   Sepsis Labs: @LABRCNTIP (procalcitonin:4,lacticidven:4)  ) Recent Results (from the past 240 hour(s))  Blood Culture (routine x 2)     Status: None (Preliminary result)   Collection Time: 11/23/21 12:51 AM   Specimen: BLOOD  Result Value Ref Range Status   Specimen Description BLOOD SITE NOT SPECIFIED  Final   Special Requests   Final    BOTTLES DRAWN AEROBIC AND ANAEROBIC Blood Culture adequate volume   Culture   Final    NO GROWTH 3  DAYS Performed at [facility], [place] [address] [place], [place] [address]    Report Status PENDING  Incomplete  Urine Culture     Status: Abnormal   Collection Time: 11/23/21 12:51 AM   Specimen: In/Out Cath Urine  Result Value Ref Range Status   Specimen Description IN/OUT CATH URINE  Final   Special Requests   Final    NONE Performed at [facility], [place] [address] [place],  [address]    Culture 200 COLONIES/mL ENTEROCOCCUS FAECALIS (A)  Final   Report Status 11/26/2021 FINAL  Final   Organism ID, Bacteria ENTEROCOCCUS FAECALIS (A)  Final      Susceptibility   Enterococcus faecalis - MIC*    AMPICILLIN <=2 SENSITIVE Sensitive     NITROFURANTOIN <=16 SENSITIVE Sensitive     VANCOMYCIN 2 SENSITIVE Sensitive     * 200 COLONIES/mL ENTEROCOCCUS FAECALIS  Blood Culture (routine x 2)     Status: None (Preliminary result)   Collection Time: 11/23/21 12:56 AM   Specimen: BLOOD LEFT FOREARM  Result Value  Ref Range Status   Specimen Description BLOOD LEFT FOREARM  Final   Special Requests   Final    BOTTLES DRAWN AEROBIC AND ANAEROBIC Blood Culture adequate volume   Culture   Final    NO GROWTH 3 DAYS Performed at [facility], [address] [place], [place] [address]    Report Status PENDING  Incomplete  Resp Panel by RT-PCR (Flu A&B, Covid) Nasopharyngeal Swab     Status: None   Collection Time: 11/23/21  7:47 AM   Specimen: Nasopharyngeal Swab; Nasopharyngeal(NP) swabs in vial transport medium  Result Value Ref Range Status   SARS Coronavirus 2 by RT PCR NEGATIVE NEGATIVE Final    Comment: (NOTE) SARS-CoV-2 target nucleic acids are NOT DETECTED.  The SARS-CoV-2 RNA is generally detectable in upper respiratory specimens during the acute phase of infection. The lowest concentration of SARS-CoV-2 viral copies this assay can detect is 138 copies/mL. A negative result does not preclude SARS-Cov-2 infection and should not be used as the sole basis for  treatment or other patient management decisions. A negative result may occur with  improper specimen collection/handling, submission of specimen other than nasopharyngeal swab, presence of viral mutation(s) within the areas targeted by this assay, and inadequate number of viral copies(<138 copies/mL). A negative result must be combined with clinical observations, patient history, and epidemiological information. The expected result is Negative.  Fact Sheet for Patients:  EntrepreneurPulse.com.au  Fact Sheet for Healthcare Providers:  IncredibleEmployment.be  This test is no t yet approved or cleared by the [place] FDA and  has been authorized for detection and/or diagnosis of SARS-CoV-2 by FDA under an Emergency Use Authorization (EUA). This EUA will remain  in effect (meaning this test can be used) for the duration of the COVID-19 declaration under Section 564(b)(1) of the Act, 21 U.S.C.section 360bbb-3(b)(1), unless the authorization is terminated  or revoked sooner.       Influenza A by PCR NEGATIVE NEGATIVE Final   Influenza B by PCR NEGATIVE NEGATIVE Final    Comment: (NOTE) The Xpert Xpress SARS-CoV-2/FLU/RSV plus assay is intended as an aid in the diagnosis of influenza from Nasopharyngeal swab specimens and should not be used as a sole basis for treatment. Nasal washings and aspirates are unacceptable for Xpert Xpress SARS-CoV-2/FLU/RSV testing.  Fact Sheet for Patients: EntrepreneurPulse.com.au  Fact Sheet for Healthcare Providers: IncredibleEmployment.be  This test is not yet approved or cleared by the [place] FDA and has been authorized for detection and/or diagnosis of SARS-CoV-2 by FDA under an Emergency Use Authorization (EUA). This EUA will remain in effect (meaning this test can be used) for the duration of the COVID-19 declaration under Section 564(b)(1) of the Act, 21  U.S.C. section 360bbb-3(b)(1), unless the authorization is terminated or revoked.  Performed at [facility], [place] [address] [place], [place] [address]   Respiratory (~20 pathogens) panel by PCR     Status: None   Collection Time: 11/23/21  7:47 AM   Specimen: Nasopharyngeal Swab; Respiratory  Result Value Ref Range Status   Adenovirus NOT DETECTED NOT DETECTED Final   Coronavirus 229E NOT DETECTED NOT DETECTED Final    Comment: (NOTE) The Coronavirus on the Respiratory Panel, DOES NOT test for the novel  Coronavirus (2019 nCoV)    Coronavirus HKU1 NOT DETECTED NOT DETECTED Final   Coronavirus NL63 NOT DETECTED NOT DETECTED Final   Coronavirus OC43 NOT DETECTED NOT DETECTED Final   Metapneumovirus NOT DETECTED NOT DETECTED Final  Rhinovirus / Enterovirus NOT DETECTED NOT DETECTED Final   Influenza A NOT DETECTED NOT DETECTED Final   Influenza B NOT DETECTED NOT DETECTED Final   Parainfluenza Virus 1 NOT DETECTED NOT DETECTED Final   Parainfluenza Virus 2 NOT DETECTED NOT DETECTED Final   Parainfluenza Virus 3 NOT DETECTED NOT DETECTED Final   Parainfluenza Virus 4 NOT DETECTED NOT DETECTED Final   Respiratory Syncytial Virus NOT DETECTED NOT DETECTED Final   Bordetella pertussis NOT DETECTED NOT DETECTED Final   Bordetella Parapertussis NOT DETECTED NOT DETECTED Final   Chlamydophila pneumoniae NOT DETECTED NOT DETECTED Final   Mycoplasma pneumoniae NOT DETECTED NOT DETECTED Final    Comment: Performed at [facility], [place] [address], [place], [place] [address]    Scheduled Meds:  (feeding supplement) PROSource Plus  30 mL Oral BID BM   diphenhydrAMINE  25 mg Oral QHS   ezetimibe  10 mg Oral Daily   feeding supplement  237 mL Oral BID WC   ferrous sulfate  325 mg Oral BID WC   gabapentin  300 mg Oral BID   insulin aspart  0-9 Units Subcutaneous TID WC   insulin aspart  3 Units Subcutaneous TID WC   insulin glargine-yfgn  15 Units Subcutaneous Daily    levothyroxine  75 mcg Oral Q0600   multivitamin with minerals  1 tablet Oral Daily   mupirocin cream   Topical Daily   nystatin   Topical BID   pantoprazole  80 mg Oral QHS   simvastatin  40 mg Oral q1800   Continuous Infusions:  cefTRIAXone (ROCEPHIN)  IV 1 g (11/26/21 0034)   ferric gluconate (FERRLECIT) IVPB       LOS: 2 days    Time spent: 54min  Domenic Polite, MD [facility]   11/26/2021, 2:01 PM

## 2021-11-26 NOTE — Telephone Encounter (Signed)
-----   Message from Jerene Bears, MD sent at 11/26/2021 10:53 AM EST ----- App visit in 6 weeks or so Hospital EGD/Colon, IDA, getting iv iron in hospital [facility] pt Thanks JMP

## 2021-11-26 NOTE — Assessment & Plan Note (Signed)
Heart rate controlled, holding Xarelto today

## 2021-11-26 NOTE — Assessment & Plan Note (Signed)
-  Diagnosed 2 months ago -on Xarelto for A. fib, had an EGD in November which was unrevealing, colonoscopy has been pending -Downtrend in hemoglobin noted, started IV iron, holding [facility] gastro[facility]ology input, plan for colonoscopy today, if work-up is unrevealing will need capsule endoscopy, hemoglobin improving, continue IV iron -CBC in a.m.

## 2021-11-26 NOTE — Anesthesia Procedure Notes (Signed)
Procedure Name: MAC Date/Time: 11/26/2021 9:52 AM Performed by: Janace Litten, CRNA Pre-anesthesia Checklist: Patient identified, Emergency Drugs available, Suction available and Patient being monitored Patient Re-evaluated:Patient Re-evaluated prior to induction Oxygen Delivery Method: Simple face mask

## 2021-11-26 NOTE — Assessment & Plan Note (Signed)
Wound care.

## 2021-11-26 NOTE — Progress Notes (Signed)
Patient refused insulin this morning. Patient educated on need for insulin but still refused. Blood sugar noted as 196

## 2021-11-26 NOTE — Assessment & Plan Note (Signed)
Continue supplements

## 2021-11-26 NOTE — Anesthesia Postprocedure Evaluation (Signed)
Anesthesia Post Note  Patient: Jeremiah Beasley.  Procedure(s) Performed: COLONOSCOPY WITH PROPOFOL POLYPECTOMY HEMOSTASIS CONTROL     Patient location during evaluation: PACU Anesthesia Type: MAC Level of consciousness: awake and alert, awake and oriented Pain management: pain level controlled Vital Signs Assessment: post-procedure vital signs reviewed and stable Respiratory status: spontaneous breathing, nonlabored ventilation, respiratory function stable and patient connected to nasal cannula oxygen Cardiovascular status: stable and blood pressure returned to baseline Postop Assessment: no apparent nausea or vomiting Anesthetic complications: no   No notable events documented.  Last Vitals:  Vitals:   11/26/21 1055 11/26/21 1111  BP: (!) 168/71 (!) 163/55  Pulse: (!) 51 (!) 52  Resp: 16 17  Temp:  (!) 36.3 C  SpO2: 99% 100%    Last Pain:  Vitals:   11/26/21 1111  TempSrc:   PainSc: 0-No pain                 Catalina Gravel

## 2021-11-26 NOTE — Procedures (Addendum)
I spoke with the patient's wife by phone and let her know the findings of the colonoscopy  At this point we will monitor hemoglobin and iron studies. Hold Xarelto x2 days Office follow-up with [facility] in 4 to 6 weeks; if persistent anemia then consider video capsule endoscopy as an outpatient We will communicate with the patient once pathology results are available Inpatient GI team will sign off for now, call if questions

## 2021-11-26 NOTE — Progress Notes (Signed)
Physical Therapy Treatment Patient Details Name: Jeremiah Beasley. MRN: [id number] DOB: 07-08-1946 Today's Date: 11/26/2021   History of Present Illness Jeremiah Beasley. is a 76 y.o. male with medical history significant of PAF on Xarelto, IDDM, CKD 3  Pt with poor functional baseline and progressive decline over past year following Cholecystitis in Dec 2021.  This followed by MRSA cellulitis of leg wound in Sept.  Development of sacral and buttocks decubitus ulcers which became infected with cellulitis in Nov requiring admission to hospital. Has remained weak and mostly non-ambulatory. S/P colonoscopy this am.    PT Comments    Patient received in recliner. Agrees to PT session. Wife in room. He reports he is feeling pretty good, had colonoscopy this morning. Patient requires min guard for sit to stand. Ambulated 200 feet with RW and min guard. Some adjusting assist needed while using RW, tending to veer to right with walking. Patient without lob or significant fatigue during ambulation. He will continue to benefit from skilled PT while here to improve strength and safety for return home at discharge.       Recommendations for follow up therapy are one component of a multi-disciplinary discharge planning process, led by the attending physician.  Recommendations may be updated based on patient status, additional functional criteria and insurance authorization.  Follow Up Recommendations  Home health PT     Assistance Recommended at Discharge Intermittent Supervision/Assistance  Patient can return home with the following Assistance with cooking/housework;Two people to help with walking and/or transfers;Help with stairs or ramp for entrance   Equipment Recommendations  None recommended by PT    Recommendations for Other Services       Precautions / Restrictions Precautions Precaution Comments: mod fall Restrictions Weight Bearing Restrictions: No     Mobility  Bed Mobility                General bed mobility comments: Patient received in recliner    Transfers Overall transfer level: Needs assistance Equipment used: Rolling walker (2 wheels) Transfers: Sit to/from Stand Sit to Stand: Min guard                Ambulation/Gait Ambulation/Gait assistance: Min guard Gait Distance (Feet): 200 Feet Assistive device: Rolling walker (2 wheels) Gait Pattern/deviations: Step-through pattern Gait velocity: slightly decr     General Gait Details: patient min guard for ambulation to nursing station and back to room. Mild fatigue, no rest breaks needed.   Stairs             Wheelchair Mobility    Modified Rankin (Stroke Patients Only)       Balance Overall balance assessment: Modified Independent Sitting-balance support: Feet supported Sitting balance-Leahy Scale: Normal     Standing balance support: Bilateral upper extremity supported;During functional activity Standing balance-Leahy Scale: Good                              Cognition Arousal/Alertness: Awake/alert Behavior During Therapy: WFL for tasks assessed/performed Overall Cognitive Status: Within Functional Limits for tasks assessed                                          Exercises      General Comments        Pertinent Vitals/Pain Pain Assessment: No/denies pain    Home Living  Prior Function            PT Goals (current goals can now be found in the care plan section) Acute Rehab PT Goals Patient Stated Goal: to go home PT Goal Formulation: With patient Time For Goal Achievement: 12/07/21 Potential to Achieve Goals: Good Progress towards PT goals: Progressing toward goals    Frequency    Min 3X/week      PT Plan Current plan remains appropriate    Co-evaluation              AM-PAC PT "6 Clicks" Mobility   Outcome Measure  Help needed turning from your back to your side while in  a flat bed without using bedrails?: A Little Help needed moving from lying on your back to sitting on the side of a flat bed without using bedrails?: A Little Help needed moving to and from a bed to a chair (including a wheelchair)?: A Little Help needed standing up from a chair using your arms (e.g., wheelchair or bedside chair)?: A Little Help needed to walk in hospital room?: A Little Help needed climbing 3-5 steps with a railing? : A Little 6 Click Score: 18    End of Session Equipment Utilized During Treatment: Gait belt Activity Tolerance: Patient tolerated treatment well Patient left: in chair;with call bell/phone within reach;with family/visitor present Nurse Communication: Mobility status PT Visit Diagnosis: Muscle weakness (generalized) (M62.81)     Time: 1415-1430 PT Time Calculation (min) (ACUTE ONLY): 15 min  Charges:  $Gait Training: 8-22 mins                     [employer], PT, GCS 11/26/21,3:02 PM

## 2021-11-26 NOTE — Progress Notes (Signed)
PT Cancellation Note  Patient Details Name: Jeremiah Beasley. MRN: [id number] DOB: [birth year]   Cancelled Treatment:    Reason Eval/Treat Not Completed: Patient at procedure or test/unavailable. Patient out for colonoscopy this am. Will re-attempt later today if time allows.    Jenisa Monty 11/26/2021, 9:49 AM

## 2021-11-26 NOTE — Progress Notes (Signed)
Mobility Specialist Progress Note: ° ° 11/26/21 1640  °Mobility  °Activity Ambulated in hall  °Level of Assistance Standby assist, set-up cues, supervision of patient - no hands on  °Assistive Device Front wheel walker  °Distance Ambulated (ft) 220 ft  °Mobility Ambulated with assistance in hallway  °Mobility Response Tolerated well  °Mobility performed by Mobility specialist  °Bed Position Chair  °$Mobility charge 1 Mobility  ° °Pt eager for mobility. Ambulated without complaint. Back in chair with all needs met.  ° °  °Mobility Specialist  °Phone [phone] ° °

## 2021-11-26 NOTE — Assessment & Plan Note (Signed)
-  Has superficial sacral decubitus wounds and mild cellulitis on CT, physical exam not very impressive -Resolved, afebrile now -COVID, influenza and respiratory virus panels are negative -Continue empiric Rocephin, discontinue vancomycin, blood cultures are negative  -Discontinued IV fluids -Transition to oral Keflex today

## 2021-11-26 NOTE — Assessment & Plan Note (Signed)
Insulin pump discontinued while inpatient, continue Semglee, will increase dose -Continue meal coverage

## 2021-11-26 NOTE — Transfer of Care (Signed)
Immediate Anesthesia Transfer of Care Note  Patient: Jeremiah Beasley.  Procedure(s) Performed: COLONOSCOPY WITH PROPOFOL POLYPECTOMY HEMOSTASIS CONTROL  Patient Location: PACU  Anesthesia Type:MAC  Level of Consciousness: drowsy, patient cooperative and responds to stimulation  Airway & Oxygen Therapy: Patient Spontanous Breathing  Post-op Assessment: Report given to RN and Post -op Vital signs reviewed and stable  Post vital signs: Reviewed and stable  Last Vitals:  Vitals Value Taken Time  BP 141/67 11/26/21 1041  Temp 36.6 C 11/26/21 1041  Pulse 52 11/26/21 1044  Resp 16 11/26/21 1044  SpO2 99 % 11/26/21 1044  Vitals shown include unvalidated device data.  Last Pain:  Vitals:   11/26/21 1041  TempSrc:   PainSc: 0-No pain         Complications: No notable events documented.

## 2021-11-26 NOTE — Progress Notes (Signed)
OT Cancellation Note  Patient Details Name: Jeremiah Beasley. MRN: [id number] DOB: [birth year]   Cancelled Treatment:    Reason Eval/Treat Not Completed: Patient at procedure or test/ unavailable. Off floor for colonoscopy. OT to check back on 1/10.  Gloris Manchester OTR/L Supplemental OT, Department of rehab services [phone]  Marivel Mcclarty R H. 11/26/2021, 9:54 AM

## 2021-11-26 NOTE — Assessment & Plan Note (Signed)
Creatinine relatively stable, monitor

## 2021-11-26 NOTE — Interval H&P Note (Signed)
History and Physical Interval Note: For colonoscopy this morning with monitored anesthesia care Tolerated the prep Mild buttock pain the day near decubitus ulcerations but no abdominal pain, nausea, vomiting, chest pain or dyspnea He doesn't recall prior colonoscopy EGD in Nov unremarkable Iron studies now appear more like chronic disease than IDA, ferritin is normal, TIBC is low. Stool reportedly previously heme-negative He Xarelto has been held since the sixth  CBC Latest Ref Rng & Units 11/26/2021 11/25/2021 11/24/2021  WBC 4.0 - 10.5 K/uL 2.0(L) 2.0(L) 1.5(L)  Hemoglobin 13.0 - 17.0 g/dL 9.0(L) 8.3(L) 7.8(L)  Hematocrit 39.0 - 52.0 % 27.7(L) 24.7(L) 23.4(L)  Platelets 150 - 400 K/uL 195 200 187     The nature of the procedure, as well as the risks, benefits, and alternatives were carefully and thoroughly reviewed with the patient. Ample time for discussion and questions allowed. The patient understood, was satisfied, and agreed to proceed.    11/26/2021 9:34 AM  Jeremiah Beasley.  has presented today for surgery, with the diagnosis of IDA.  The various methods of treatment have been discussed with the patient and family. After consideration of risks, benefits and other options for treatment, the patient has consented to  Procedure(s): COLONOSCOPY WITH PROPOFOL (N/A) as a surgical intervention.  The patient's history has been reviewed, patient examined, no change in status, stable for surgery.  I have reviewed the patient's chart and labs.  Questions were answered to the patient's satisfaction.     [name]

## 2021-11-26 NOTE — Telephone Encounter (Signed)
Pt scheduled to see Dr. Henrene Pastor 01/07/22 at 10am.

## 2021-11-26 NOTE — Op Note (Signed)
[facility] Patient Name: Jeremiah Beasley Procedure Date : 11/26/2021 MRN: [id number] Attending MD: Jerene Bears , MD Date of Birth: 07-26-1946 CSN: [id number] Age: 76 Admit Type: Inpatient Procedure:                Colonoscopy Indications:              Iron deficiency anemia (heme negative stool) mixed                            picture anemia (chronic disease), this is the                            patient's first colonoscopy, EGD in Nov rather                            unrevealing Providers:                Lajuan Lines. Hilarie Fredrickson, MD, Ervin Knack, RN, Hinton Dyer                            Technician, Technician Referring MD:             [facility] Medicines:                Monitored Anesthesia Care Complications:            No immediate complications. Estimated Blood Loss:     Estimated blood loss was minimal. Procedure:                Pre-Anesthesia Assessment:                           - Prior to the procedure, a History and Physical                            was performed, and patient medications and                            allergies were reviewed. The patient's tolerance of                            previous anesthesia was also reviewed. The risks                            and benefits of the procedure and the sedation                            options and risks were discussed with the patient.                            All questions were answered, and informed consent                            was obtained. Prior Anticoagulants: The patient has  taken Xarelto (rivaroxaban), last dose was 3 days                            prior to procedure. ASA Grade Assessment: III - A                            patient with severe systemic disease. After                            reviewing the risks and benefits, the patient was                            deemed in satisfactory condition to undergo the                             procedure.                           After obtaining informed consent, the colonoscope                            was passed under direct vision. Throughout the                            procedure, the patient's blood pressure, pulse, and                            oxygen saturations were monitored continuously. The                            CF-HQ190L ([id number]) Olympus coloscope was                            introduced through the anus and advanced to the                            cecum, identified by appendiceal orifice and                            ileocecal valve. The colonoscopy was performed                            without difficulty. The patient tolerated the                            procedure well. The quality of the bowel                            preparation was good. The ileocecal valve,                            appendiceal orifice, and rectum were photographed. Scope In: 4:26:83 AM Scope Out: 10:33:07 AM Scope Withdrawal Time: 0 hours 33 minutes 32 seconds  Total Procedure Duration: 0 hours 36 minutes 49  seconds  Findings:      Hemorrhoids were found on perianal exam. 3 decubitus ulcerations without       bleeding seen on the bilateral buttocks.      Six sessile and semi-pedunculated polyps were found in the ascending       colon. The polyps were 4 to 12 mm in size. These polyps were removed       with a cold snare. Resection and retrieval were complete.      A 20 mm polyp was found in the ascending colon. The polyp was       semi-pedunculated. The polyp was removed with a hot snare. Resection and       retrieval were complete. To prevent bleeding after the polypectomy, two       hemostatic clips were successfully placed (MR conditional). There was no       bleeding during, or at the end, of the procedure.      Five sessile polyps were found in the transverse colon. The polyps were       5 to 14 mm in size. These polyps were removed with a cold snare.        Resection and retrieval were complete. The post-polypectomy base of 1       polyp was oozing slight and thus one hemostatic clip was successfully       placed (MR conditional). There was no bleeding at the end of the       maneuver.      Internal hemorrhoids were found during retroflexion and during digital       exam. The hemorrhoids were medium-sized. Impression:               - Decubitus ulcerations.                           - Six 4 to 12 mm polyps in the ascending colon,                            removed with a cold snare. Resected and retrieved.                           - One 20 mm polyp in the ascending colon, removed                            with a hot snare. Resected and retrieved. Clips (MR                            conditional) were placed.                           - Five 5 to 14 mm polyps in the transverse colon,                            removed with a cold snare. Resected and retrieved.                            Clip (MR conditional) was placed.                           -  Internal hemorrhoids. Moderate Sedation:      N/A Recommendation:           - Return patient to hospital ward for ongoing care.                           - Resume previous diet.                           - Continue present medications.                           - Resume Xarelto (rivaroxaban) at prior dose in 2                            days. Close attention for any bleeding with                            resumption of anticoagulation.                           - Await pathology results.                           - Monitor Hgb and iron studies (ferritin + IBC) as                            an outpatient. Replace iron if needed.                           - Repeat colonoscopy is recommended for                            surveillance of multiple polyps (likely in 1 year                            with GI MD in [place] or okay to return to                            [facility] if preference). The  colonoscopy date                            will be determined after pathology results from                            today's exam become available for review. Procedure Code(s):        --- Professional ---                           [phone], Colonoscopy, flexible; with removal of                            tumor(s), polyp(s), or other lesion(s) by snare                            technique Diagnosis  Code(s):        --- Professional ---                           K64.8, Other hemorrhoids                           K63.5, Polyp of colon                           D50.9, Iron deficiency anemia, unspecified CPT copyright 2019 American Medical Association. All rights reserved. The codes documented in this report are preliminary and upon coder review may  be revised to meet current compliance requirements. Jerene Bears, MD 11/26/2021 10:46:26 AM This report has been signed electronically. Number of Addenda: 0

## 2021-11-26 NOTE — Assessment & Plan Note (Signed)
Blood pressure trending up, will resume hydralazine

## 2021-11-27 ENCOUNTER — Encounter (HOSPITAL_COMMUNITY): Payer: Self-pay | Admitting: Internal Medicine

## 2021-11-27 DIAGNOSIS — R6510 Systemic inflammatory response syndrome (SIRS) of non-infectious origin without acute organ dysfunction: Secondary | ICD-10-CM | POA: Diagnosis not present

## 2021-11-27 LAB — CBC
HCT: 21.9 % — ABNORMAL LOW (ref 39.0–52.0)
HCT: 23.9 % — ABNORMAL LOW (ref 39.0–52.0)
Hemoglobin: 7.1 g/dL — ABNORMAL LOW (ref 13.0–17.0)
Hemoglobin: 7.6 g/dL — ABNORMAL LOW (ref 13.0–17.0)
MCH: 29.2 pg (ref 26.0–34.0)
MCH: 29 pg (ref 26.0–34.0)
MCHC: 32.4 g/dL (ref 30.0–36.0)
MCHC: 31.8 g/dL (ref 30.0–36.0)
MCV: 90.1 fL (ref 80.0–100.0)
MCV: 91.2 fL (ref 80.0–100.0)
Platelets: 142 10*3/uL — ABNORMAL LOW (ref 150–400)
Platelets: 202 10*3/uL (ref 150–400)
RBC: 2.43 MIL/uL — ABNORMAL LOW (ref 4.22–5.81)
RBC: 2.62 MIL/uL — ABNORMAL LOW (ref 4.22–5.81)
RDW: 13.9 % (ref 11.5–15.5)
RDW: 13.9 % (ref 11.5–15.5)
WBC: 2.5 10*3/uL — ABNORMAL LOW (ref 4.0–10.5)
WBC: 3.2 10*3/uL — ABNORMAL LOW (ref 4.0–10.5)
nRBC: 0 % (ref 0.0–0.2)
nRBC: 0 % (ref 0.0–0.2)

## 2021-11-27 LAB — GLUCOSE, CAPILLARY
Glucose-Capillary: 214 mg/dL — ABNORMAL HIGH (ref 70–99)
Glucose-Capillary: 261 mg/dL — ABNORMAL HIGH (ref 70–99)

## 2021-11-27 LAB — SURGICAL PATHOLOGY

## 2021-11-27 MED ORDER — RIVAROXABAN 20 MG PO TABS: 20.0000 mg | ORAL_TABLET | Freq: Every day | ORAL | Status: DC

## 2021-11-27 MED ORDER — HYDRALAZINE HCL 100 MG PO TABS: 50.0000 mg | ORAL_TABLET | Freq: Two times a day (BID) | ORAL | Status: DC

## 2021-11-27 MED ORDER — FERROUS SULFATE 325 (65 FE) MG PO TABS: 325.0000 mg | ORAL_TABLET | Freq: Two times a day (BID) | ORAL | 1 refills | Status: AC

## 2021-11-27 MED ORDER — CEPHALEXIN 250 MG PO CAPS: 250.0000 mg | ORAL_CAPSULE | Freq: Three times a day (TID) | ORAL | 0 refills | Status: AC

## 2021-11-27 MED ORDER — INSULIN PUMP: 1.0000 | SUBCUTANEOUS | Status: DC

## 2021-11-27 NOTE — Progress Notes (Signed)
Mobility Specialist Progress Note:   11/27/21 1100  Mobility  Activity Ambulated in hall  Level of Assistance Modified independent, requires aide device or extra time  Assistive Device Front wheel walker  Distance Ambulated (ft) 500 ft  Mobility Ambulated with assistance in hallway  Mobility Response Tolerated well  Mobility performed by Mobility specialist  Bed Position Chair  $Mobility charge 1 Mobility   Pt asx during ambulation. Back in chair with all needs met.   Nelta Numbers Mobility Specialist  Phone [phone]

## 2021-11-27 NOTE — Progress Notes (Signed)
Inpatient Diabetes Program Recommendations  AACE/ADA: New Consensus Statement on Inpatient Glycemic Control (2015)  Target Ranges:  Prepandial:   less than 140 mg/dL      Peak postprandial:   less than 180 mg/dL (1-2 hours)      Critically ill patients:  140 - 180 mg/dL   Lab Results  Component Value Date   GLUCAP 214 (H) 11/27/2021   HGBA1C 6.0 (H) 10/07/2021      Review of Glycemic Control   Diabetes history: type 1 Outpatient Diabetes medications: Insulin pump Insulin: Lispro (Humalog) Pump Brand/Model: Medtronic  Basal Total Daily Basal Insulin: 32.1  Basal Time: 00:00 (1) Basal Rate (Units/Hr): 1.4  Basal Time: 04:00 (2) Basal Rate (Units/Hr): 1.5  Basal Time: 07:00 (3) Basal Rate (Units/Hr): 1.4  Basal Time: 18:00 (4) Basal Rate (Units/Hr): 1.1   Insulin:Carb ICR Time: 00:00 (1) ICR : 9.5 ICR Time: 06:00 (2) ICR : 7 ICR Time: 11:00 (3) ICR: 8  ICR Time: 18:00 (4) ICR: 7.5  ICR Time: 21:00 (5) ICR: 8.5   Sensitivity ISF Time: 00:00 (1) ISF: 20  Target BG Time: 00:00 (1) Target BG (mg/dL): 140  BG Time: 07:30 (2) Target BG (mg/dL): 130  BG Time: 22:00 (3) Target BG (mg/dL): 140  Current orders for Inpatient glycemic control:  Semglee 15 units Daily Novolog 0-9 units tid Novolog 3 units tid meal coverage Ensure enlive bid between meals   Inpatient Diabetes Program Recommendations:    Consider increasing Semglee to 20 units QD.  Thanks, Bronson Curb, MSN, RNC-OB Diabetes Coordinator [phone] (8a-5p)   Consider further increasing

## 2021-11-27 NOTE — Progress Notes (Signed)
Occupational Therapy Treatment Patient Details Name: Jeremiah Beasley. MRN: [id number] DOB: Jun 18, 1946 Today's Date: 11/27/2021   History of present illness Jeremiah Floren. is a 76 y.o. male with medical history significant of PAF on Xarelto, IDDM, CKD 3  Pt with poor functional baseline and progressive decline over past year following Cholecystitis in Dec 2021.  This followed by MRSA cellulitis of leg wound in Sept.  Development of sacral and buttocks decubitus ulcers which became infected with cellulitis in Nov requiring admission to hospital. Has remained weak and mostly non-ambulatory. S/P colonoscopy this am.   OT comments  Pt progressing overall towards acute OT goals. Focus of session was energy conservation and building activity tolerance as relates to ADL management. Pt recently back from walking in the halls with mobility tech, declined further physical activity at this time. Spouse present and included in session. D/c plan remains appropriate.    Recommendations for follow up therapy are one component of a multi-disciplinary discharge planning process, led by the attending physician.  Recommendations may be updated based on patient status, additional functional criteria and insurance authorization.    Follow Up Recommendations  Home health OT    Assistance Recommended at Discharge Frequent or constant Supervision/Assistance  Patient can return home with the following  A little help with walking and/or transfers;A little help with bathing/dressing/bathroom   Equipment Recommendations  None recommended by OT    Recommendations for Other Services      Precautions / Restrictions Precautions Precautions: Fall Restrictions Weight Bearing Restrictions: No       Mobility Bed Mobility               General bed mobility comments: Patient received in recliner    Transfers                         Balance Overall balance assessment: Modified  Independent Sitting-balance support: Feet supported Sitting balance-Leahy Scale: Normal     Standing balance support: Bilateral upper extremity supported;During functional activity Standing balance-Leahy Scale: Good Standing balance comment: Reliant on BUE support                           ADL either performed or assessed with clinical judgement   ADL                                         General ADL Comments: Discussed energy conservation and building activity tolerance as related to ADL management.    Extremity/Trunk Assessment Upper Extremity Assessment Upper Extremity Assessment: Overall WFL for tasks assessed   Lower Extremity Assessment Lower Extremity Assessment: Defer to PT evaluation        Vision       Perception     Praxis      Cognition Arousal/Alertness: Awake/alert Behavior During Therapy: WFL for tasks assessed/performed Overall Cognitive Status: Within Functional Limits for tasks assessed                                            Exercises     Shoulder Instructions       General Comments      Pertinent Vitals/ Pain       Pain Assessment: No/denies  pain  Home Living                                          Prior Functioning/Environment              Frequency  Min 2X/week        Progress Toward Goals  OT Goals(current goals can now be found in the care plan section)  Progress towards OT goals: Progressing toward goals  Acute Rehab OT Goals Patient Stated Goal: home today OT Goal Formulation: With patient Time For Goal Achievement: 12/07/21 Potential to Achieve Goals: Good ADL Goals Pt Will Perform Grooming: with min guard assist;standing Pt Will Perform Upper Body Bathing: with set-up;sitting Pt Will Perform Lower Body Bathing: with supervision;sit to/from stand Pt Will Perform Upper Body Dressing: with set-up;sitting Pt Will Perform Lower Body Dressing:  with min assist Pt Will Transfer to Toilet: with supervision;ambulating;bedside commode Pt Will Perform Toileting - Clothing Manipulation and hygiene: with supervision;sit to/from stand Additional ADL Goal #1: Pt will be S in and OOB for basic ADLs  Plan Discharge plan remains appropriate    Co-evaluation                 AM-PAC OT "6 Clicks" Daily Activity     Outcome Measure   Help from another person eating meals?: None Help from another person taking care of personal grooming?: A Little Help from another person toileting, which includes using toliet, bedpan, or urinal?: A Lot Help from another person bathing (including washing, rinsing, drying)?: A Lot Help from another person to put on and taking off regular upper body clothing?: A Little Help from another person to put on and taking off regular lower body clothing?: A Lot 6 Click Score: 16    End of Session    OT Visit Diagnosis: Unsteadiness on feet (R26.81);Other abnormalities of gait and mobility (R26.89);Muscle weakness (generalized) (M62.81)   Activity Tolerance Patient tolerated treatment well   Patient Left in chair;with call bell/phone within reach;with family/visitor present (did not alter any chair alarm)   Nurse Communication          Time: 8871-9597 OT Time Calculation (min): 11 min  Charges: OT General Charges $OT Visit: 1 Visit OT Treatments $Self Care/Home Management : 8-22 mins  Tyrone Schimke, OT Acute Rehabilitation Services Office: [phone]   Hortencia Pilar 11/27/2021, 1:41 PM

## 2021-11-27 NOTE — TOC Transition Note (Addendum)
Transition of Care [facility]) - CM/SW Discharge Note   Patient Details  Name: Jeremiah Beasley. MRN: [id number] Date of Birth: 07/16/1946  Transition of Care [facility]) CM/SW Contact:  Marilu Favre, RN Phone Number: 11/27/2021, 10:27 AM   Clinical Narrative:      Transition of Care [facility]) Screening Note   Patient Details  Name: Jeremiah Beasley. Date of Birth: [birth year]   Entered home health orders for RN and PT and face to face for MD to sign . Patient active with [facility] . Notified Malachy Mood with [facility] of discharge today.   Transition of Care Department [facility]) has reviewed patient and no TOC needs have been identified at this time. We will continue to monitor patient advancement through interdisciplinary progression rounds. If new patient transition needs arise, please place a TOC consult.      Barriers to Discharge: Continued Medical Work up   Patient Goals and CMS Choice Patient states their goals for this hospitalization and ongoing recovery are:: return home with wife CMS Medicare.gov Compare Post Acute Care list provided to:: Patient Choice offered to / list presented to : Patient, Spouse  Discharge Placement                       Discharge Plan and Services   Discharge Planning Services: CM Consult Post Acute Care Choice: Home Health                    HH Arranged: RN, PT [facility] Agency: [place] Date [place]: 11/24/21 Time [place]: 46 Representative spoke with at [place]: [place] ([place]) Interventions     Readmission Risk Interventions No flowsheet data found.

## 2021-11-27 NOTE — Progress Notes (Signed)
RN gave patient DC instructions and the patient and his wife they both stated understanding. IV has been removed and the patient is taking a bath and getting dressed. New medications have been escribed to patients home pharmacy.

## 2021-11-27 NOTE — Discharge Summary (Signed)
Physician Discharge Summary  Jeremiah Beasley. TDD:[id number] DOB: 07/13/1946 DOA: 11/23/2021  PCP: [name], MD  Admit date: 11/23/2021 Discharge date: 11/27/2021  Time spent: 35 minutes  Recommendations for Outpatient Follow-up:  PCP in 1 week, please check CBC at follow-up Gastroenterology Dr. Henrene Pastor on 2/20, may need capsule endoscopy if hemoglobin remains low Follow-up biopsies of colon polyps   Discharge Diagnoses:  Principal Problem:   SIRS (systemic inflammatory response syndrome) ([place]) Active Problems:   Iron deficiency anemia   Paroxysmal atrial fibrillation (HCC)   Type 1 diabetes with stage 3 chronic kidney disease moderate GFR 30-59 (HCC)   Sacral decubitus ulcer Moderate protein-calorie malnutrition (HCC)   CKD (chronic kidney disease), stage III (HCC)   Hypertension   Diabetic neuropathy ([place])   Essential (primary) hypertension   Hyponatremia   Generalized weakness   MRSA (methicillin resistant staph aureus) culture positive   Anemia   Cellulitis of buttock   Decubitus ulcers   Sepsis ([place])   Benign neoplasm of ascending colon   Benign neoplasm of transverse colon   Discharge Condition: Stable  Diet recommendation: Diabetic  Filed Weights   11/26/21 0929  Weight: 83.2 kg    History of present illness:  Jeremiah Beasley is a 75/M with history of paroxysmal atrial fibrillation on Xarelto, insulin-dependent diabetes, CKD stage IIIa, iron deficiency anemia, with poor functional status and progressive decline since December 2021.  He was hospitalized in September with sacral decubitus wounds, cellulitis requiring antibiotics and inpatient admission. -Continue to have failure to thrive, minimally ambulatory at baseline. -Presented to the ED 1/6 with fever, decreased appetite, per EMS temp was 104.  Also has a low-grade cough.  In the ED temp was 100.7, WBC was 1.8, hemoglobin 8.4 down from 10.4 in December    Hospital Course:   Fever, SIRS -Has superficial  sacral decubitus wounds and mild cellulitis on CT, physical exam not very impressive -Resolved, afebrile now -COVID, influenza and respiratory virus panels are negative, blood cultures are negative -Treated with empiric IV ceftriaxone Rocephin, transition to oral Keflex for 3 more days    Iron deficiency anemia -Diagnosed 2 months ago -on Xarelto for A. fib, had an EGD in November which was unrevealing, colonoscopy has been pending -Downtrend in hemoglobin noted, started IV iron, held [place] -Gastroenterology consulted, underwent colonoscopy which noted multiple colon polyps which were removed, GI recommended to hold Xarelto for 2 more days  -Recommended to continue oral iron and follow-up with Dr. Henrene Pastor in February  -May need a capsule study if his hemoglobin remains low, needs follow-up of polyp Biopsies -Check CBC in 1 week   Paroxysmal atrial fibrillation -Restart Xarelto on Thursday 1/12   Type 1 diabetes mellitus -Insulin pump discontinued while inpatient, inpatient we used Semglee, meal coverage and sliding scale -Will transition back to insulin pump at discharge   Diabetic peripheral neuropathy -Continue gabapentin   Hypertension -BP stable, hydralazine dose decreased   Sacral decubitus wounds -WOC consult appreciated, continue routine wound care, frequent repositioning   Moderate protein calorie malnutrition -Supplements added   CKD 3a -Creatinine at baseline    Discharge Exam: Vitals:   11/27/21 0401 11/27/21 0754  BP: (!) 164/74 (!) 151/75  Pulse: 60 (!) 58  Resp: 17 17  Temp: 98 F (36.7 C) 98.7 F (37.1 C)  SpO2: 97% 92%   Pleasant male, laying in bed, AAOx3, nondistressed HEENT: No JVD CVS: S1-S2, regular rate rhythm Lungs: Clear bilaterally Abdomen: Soft, nontender, bowel sounds present Extremities:  No edema  Skin: Small superficial sacral decubitus wound Psychiatry: Judgement and insight appear normal. Mood & affect appropriate.    Discharge  Instructions   Discharge Instructions     Diet - low sodium heart healthy   Complete by: As directed    Discharge wound care:   Complete by: As directed    routine   Increase activity slowly   Complete by: As directed       Allergies as of 11/27/2021       Reactions   Quinapril Shortness Of Breath, Swelling, Other (See Comments)   TONGUE SWELLLING   Methaqualone Other (See Comments)   Unknown to patient   Tape Rash, Other (See Comments)   Skin is sensitive!!        Medication List     TAKE these medications    acetaminophen 325 MG tablet Commonly known as: TYLENOL Take 2 tablets (650 mg total) by mouth every 6 (six) hours as needed for mild pain, fever or headache.   CENTRUM SILVER ADULT 50+ PO Take 1 tablet by mouth daily with breakfast.   cephALEXin 250 MG capsule Commonly known as: KEFLEX Take 1 capsule (250 mg total) by mouth 3 (three) times daily for 3 days.   diphenhydrAMINE 25 mg capsule Commonly known as: BENADRYL Take 25 mg by mouth at bedtime.   ezetimibe 10 MG tablet Commonly known as: ZETIA Take 1 tablet (10 mg total) by mouth daily.   ferrous sulfate 325 (65 FE) MG tablet Take 1 tablet (325 mg total) by mouth 2 (two) times daily with a meal.   gabapentin 300 MG capsule Commonly known as: NEURONTIN Take 1 capsule (300 mg total) by mouth 2 (two) times daily.   hydrALAZINE 100 MG tablet Commonly known as: APRESOLINE Take 0.5 tablets (50 mg total) by mouth 2 (two) times daily. What changed:  how much to take when to take this   insulin pump Soln Inject 1 each into the skin continuous. Resume Insulin pump this evening What changed: additional instructions   levothyroxine 75 MCG tablet Commonly known as: SYNTHROID Take 75 mcg by mouth daily before breakfast.   omeprazole 40 MG capsule Commonly known as: PRILOSEC Take 40 mg by mouth daily before supper.   rivaroxaban 20 MG Tabs tablet Commonly known as: XARELTO Take 1 tablet (20 mg  total) by mouth daily with supper. Start taking on: November 29, 2021 What changed: These instructions start on November 29, 2021. If you are unsure what to do until then, ask your doctor or other care provider.   simvastatin 80 MG tablet Commonly known as: ZOCOR Take 80 mg by mouth daily.               Discharge Care Instructions  (From admission, onward)           Start     Ordered   11/27/21 0000  Discharge wound care:       Comments: routine   11/27/21 1135           Allergies  Allergen Reactions   Quinapril Shortness Of Breath, Swelling and Other (See Comments)    TONGUE SWELLLING   Methaqualone Other (See Comments)    Unknown to patient   Tape Rash and Other (See Comments)    Skin is sensitive!!    Follow-up Information     [facility].   Contact information: [place] [address] [phone]  The results of significant diagnostics from this hospitalization (including imaging, microbiology, ancillary and laboratory) are listed below for reference.    Significant Diagnostic Studies: CT ABDOMEN PELVIS W CONTRAST  Result Date: 11/23/2021 CLINICAL DATA:  Sacral wound infection. EXAM: CT ABDOMEN AND PELVIS WITH CONTRAST TECHNIQUE: Multidetector CT imaging of the abdomen and pelvis was performed using the standard protocol following bolus administration of intravenous contrast. CONTRAST:  136mL OMNIPAQUE IOHEXOL 300 MG/ML  SOLN COMPARISON:  August 20, 2021 FINDINGS: Lower chest: Moderate severity atelectasis is seen within the posterior aspect of the bilateral lung bases. There is a very small right pleural effusion. Hepatobiliary: No focal liver abnormality is seen. Status post cholecystectomy. No biliary dilatation. Pancreas: Unremarkable. No pancreatic ductal dilatation or surrounding inflammatory changes. Spleen: A 3.3 cm x 2.1 cm area of low attenuation is seen along the anterior tip of an  enlarged spleen. Adrenals/Urinary Tract: Adrenal glands are unremarkable. Kidneys are normal in size, without renal calculi or hydronephrosis. A 1.6 cm diameter cystic appearing area is seen along the upper pole of the left kidney. Urinary bladder is moderately distended. Stomach/Bowel: Stomach is within normal limits. Appendix appears normal. No evidence of bowel wall thickening, distention, or inflammatory changes. Vascular/Lymphatic: Aortic atherosclerosis. No enlarged abdominal or pelvic lymph nodes. Reproductive: Prostate is unremarkable. Other: No abdominal wall hernia or abnormality. No abdominopelvic ascites. Musculoskeletal: A small superficial soft tissue defect is seen at the level of the lower sacrum, along the midline. A mild amount of associated soft tissue thickening is seen. Mild subcutaneous inflammatory fat stranding is seen along the posteromedial aspect of the gluteal region on the left. No acute osseous abnormality is identified. IMPRESSION: 1. Small superficial sacral also, without evidence of associated fluid collection, abscess or acute osteomyelitis. 2. Mild cellulitis along the posteromedial aspect of the gluteal region on the left. 3. Moderate severity bibasilar atelectasis with a very small right pleural effusion. 4. Splenomegaly with additional findings that may represent sequelae associated with a small splenic infarct. 5. Aortic atherosclerosis. Aortic Atherosclerosis (ICD10-I70.0). Electronically Signed   By: Virgina Norfolk M.D.   On: 11/23/2021 03:14   DG Chest Port 1 View  Result Date: 11/23/2021 CLINICAL DATA:  Fever and decreased appetite. EXAM: PORTABLE CHEST 1 VIEW COMPARISON:  October 06, 2021 FINDINGS: Mild atelectasis is seen within the bilateral lung bases. There is no evidence of acute infiltrate, pleural effusion or pneumothorax. The cardiac silhouette is mildly enlarged and unchanged in size. The visualized skeletal structures are unremarkable. IMPRESSION: Stable  cardiomegaly with mild bibasilar atelectasis. Electronically Signed   By: Virgina Norfolk M.D.   On: 11/23/2021 01:10   DG Hand Complete Right  Result Date: 11/23/2021 CLINICAL DATA:  Fall, concern for fracture. EXAM: RIGHT HAND - COMPLETE 3+ VIEW COMPARISON:  None. FINDINGS: There is a nondisplaced fracture at the base of the proximal phalanx of the third digit. The remaining bony structures appear intact. There is no dislocation. Joint space narrowing and osteophyte formation is noted at the interphalangeal joints and first carpometacarpal joint. Extensive vascular calcifications are present. There is soft tissue swelling over the third metacarpal phalangeal joint. IMPRESSION: Nondisplaced fracture at the base of the proximal phalanx of the third digit. Electronically Signed   By: Brett Fairy M.D.   On: 11/23/2021 03:28    Microbiology: Recent Results (from the past 240 hour(s))  Blood Culture (routine x 2)     Status: None (Preliminary result)   Collection Time: 11/23/21 12:51 AM   Specimen: BLOOD  Result Value Ref Range Status   Specimen Description BLOOD SITE NOT SPECIFIED  Final   Special Requests   Final    BOTTLES DRAWN AEROBIC AND ANAEROBIC Blood Culture adequate volume   Culture   Final    NO GROWTH 4 DAYS Performed at [facility], [address], [place], [place] [address]    Report Status PENDING  Incomplete  Urine Culture     Status: Abnormal   Collection Time: 11/23/21 12:51 AM   Specimen: In/Out Cath Urine  Result Value Ref Range Status   Specimen Description IN/OUT CATH URINE  Final   Special Requests   Final    NONE Performed at [facility], [place] [address], [place], [place] [address]    Culture 200 COLONIES/mL ENTEROCOCCUS FAECALIS (A)  Final   Report Status 11/26/2021 FINAL  Final   Organism ID, Bacteria ENTEROCOCCUS FAECALIS (A)  Final      Susceptibility   Enterococcus faecalis - MIC*    AMPICILLIN <=2 SENSITIVE Sensitive     NITROFURANTOIN <=16  SENSITIVE Sensitive     VANCOMYCIN 2 SENSITIVE Sensitive     * 200 COLONIES/mL ENTEROCOCCUS FAECALIS  Blood Culture (routine x 2)     Status: None (Preliminary result)   Collection Time: 11/23/21 12:56 AM   Specimen: BLOOD LEFT FOREARM  Result Value Ref Range Status   Specimen Description BLOOD LEFT FOREARM  Final   Special Requests   Final    BOTTLES DRAWN AEROBIC AND ANAEROBIC Blood Culture adequate volume   Culture   Final    NO GROWTH 4 DAYS Performed at [facility], [place] [address] [place],  [address]    Report Status PENDING  Incomplete  Resp Panel by RT-PCR (Flu A&B, Covid) Nasopharyngeal Swab     Status: None   Collection Time: 11/23/21  7:47 AM   Specimen: Nasopharyngeal Swab; Nasopharyngeal(NP) swabs in vial transport medium  Result Value Ref Range Status   SARS Coronavirus 2 by RT PCR NEGATIVE NEGATIVE Final    Comment: (NOTE) SARS-CoV-2 target nucleic acids are NOT DETECTED.  The SARS-CoV-2 RNA is generally detectable in upper respiratory specimens during the acute phase of infection. The lowest concentration of SARS-CoV-2 viral copies this assay can detect is 138 copies/mL. A negative result does not preclude SARS-Cov-2 infection and should not be used as the sole basis for treatment or other patient management decisions. A negative result may occur with  improper specimen collection/handling, submission of specimen other than nasopharyngeal swab, presence of viral mutation(s) within the areas targeted by this assay, and inadequate number of viral copies(<138 copies/mL). A negative result must be combined with clinical observations, patient history, and epidemiological information. The expected result is Negative.  Fact Sheet for Patients:  EntrepreneurPulse.com.au  Fact Sheet for Healthcare Providers:  IncredibleEmployment.be  This test is no t yet approved or cleared by the [place] FDA and  has been  authorized for detection and/or diagnosis of SARS-CoV-2 by FDA under an Emergency Use Authorization (EUA). This EUA will remain  in effect (meaning this test can be used) for the duration of the COVID-19 declaration under Section 564(b)(1) of the Act, 21 U.S.C.section 360bbb-3(b)(1), unless the authorization is terminated  or revoked sooner.       Influenza A by PCR NEGATIVE NEGATIVE Final   Influenza B by PCR NEGATIVE NEGATIVE Final    Comment: (NOTE) The Xpert Xpress SARS-CoV-2/FLU/RSV plus assay is intended as an aid in the diagnosis  of influenza from Nasopharyngeal swab specimens and should not be used as a sole basis for treatment. Nasal washings and aspirates are unacceptable for Xpert Xpress SARS-CoV-2/FLU/RSV testing.  Fact Sheet for Patients: EntrepreneurPulse.com.au  Fact Sheet for Healthcare Providers: IncredibleEmployment.be  This test is not yet approved or cleared by the [place] FDA and has been authorized for detection and/or diagnosis of SARS-CoV-2 by FDA under an Emergency Use Authorization (EUA). This EUA will remain in effect (meaning this test can be used) for the duration of the COVID-19 declaration under Section 564(b)(1) of the Act, 21 U.S.C. section 360bbb-3(b)(1), unless the authorization is terminated or revoked.  Performed at [facility], [place] [address], [place], [place] [address]   Respiratory (~20 pathogens) panel by PCR     Status: None   Collection Time: 11/23/21  7:47 AM   Specimen: Nasopharyngeal Swab; Respiratory  Result Value Ref Range Status   Adenovirus NOT DETECTED NOT DETECTED Final   Coronavirus 229E NOT DETECTED NOT DETECTED Final    Comment: (NOTE) The Coronavirus on the Respiratory Panel, DOES NOT test for the novel  Coronavirus (2019 nCoV)    Coronavirus HKU1 NOT DETECTED NOT DETECTED Final   Coronavirus NL63 NOT DETECTED NOT DETECTED Final   Coronavirus OC43 NOT DETECTED NOT  DETECTED Final   Metapneumovirus NOT DETECTED NOT DETECTED Final   Rhinovirus / Enterovirus NOT DETECTED NOT DETECTED Final   Influenza A NOT DETECTED NOT DETECTED Final   Influenza B NOT DETECTED NOT DETECTED Final   Parainfluenza Virus 1 NOT DETECTED NOT DETECTED Final   Parainfluenza Virus 2 NOT DETECTED NOT DETECTED Final   Parainfluenza Virus 3 NOT DETECTED NOT DETECTED Final   Parainfluenza Virus 4 NOT DETECTED NOT DETECTED Final   Respiratory Syncytial Virus NOT DETECTED NOT DETECTED Final   Bordetella pertussis NOT DETECTED NOT DETECTED Final   Bordetella Parapertussis NOT DETECTED NOT DETECTED Final   Chlamydophila pneumoniae NOT DETECTED NOT DETECTED Final   Mycoplasma pneumoniae NOT DETECTED NOT DETECTED Final    Comment: Performed at [facility], [place]. [address], [place], [place] [address]     Labs: Basic Metabolic Panel: Recent Labs  Lab 11/23/21 0051 11/24/21 0102 11/26/21 0153  NA 130* 135 141  K 3.5 3.8 4.8  CL 101 107 108  CO2 22 22 22   GLUCOSE 105* 80 219*  BUN 19 17 20   CREATININE 1.56* 1.38* 1.60*  CALCIUM 7.6* 7.9* 8.7*   Liver Function Tests: Recent Labs  Lab 11/23/21 0051  AST 25  ALT 16  ALKPHOS 59  BILITOT 0.8  PROT 6.5  ALBUMIN 2.4*   No results for input(s): LIPASE, AMYLASE in the last 168 hours. No results for input(s): AMMONIA in the last 168 hours. CBC: Recent Labs  Lab 11/23/21 0051 11/24/21 0102 11/25/21 0748 11/26/21 0153 11/27/21 0755 11/27/21 1006  WBC 1.8* 1.5* 2.0* 2.0* 2.5* 3.2*  NEUTROABS 1.2*  --   --   --   --   --   HGB 8.4* 7.8* 8.3* 9.0* 7.1* 7.6*  HCT 25.7* 23.4* 24.7* 27.7* 21.9* 23.9*  MCV 89.9 88.6 87.9 90.8 90.1 91.2  PLT 203 187 200 195 142* 202   Cardiac Enzymes: No results for input(s): CKTOTAL, CKMB, CKMBINDEX, TROPONINI in the last 168 hours. BNP: BNP (last 3 results) Recent Labs    10/06/21 1216  BNP 88.7    ProBNP (last 3 results) No results for input(s): PROBNP in the last 8760  hours.  CBG:  Recent Labs  Lab 11/26/21 1227 11/26/21 1700 11/26/21 2042 11/27/21 0753 11/27/21 1109  GLUCAP 232* 294* 266* 214* 261*       Signed:  Domenic Polite MD.  [facility] 11/27/2021, 11:42 AM

## 2021-11-28 LAB — CULTURE, BLOOD (ROUTINE X 2)
Culture: NO GROWTH
Culture: NO GROWTH
Special Requests: ADEQUATE
Special Requests: ADEQUATE

## 2021-11-29 DIAGNOSIS — I130 Hypertensive heart and chronic kidney disease with heart failure and stage 1 through stage 4 chronic kidney disease, or unspecified chronic kidney disease: Secondary | ICD-10-CM | POA: Diagnosis not present

## 2021-11-29 DIAGNOSIS — I5032 Chronic diastolic (congestive) heart failure: Secondary | ICD-10-CM | POA: Diagnosis not present

## 2021-11-29 DIAGNOSIS — L03317 Cellulitis of buttock: Secondary | ICD-10-CM | POA: Diagnosis not present

## 2021-11-29 DIAGNOSIS — N1831 Chronic kidney disease, stage 3a: Secondary | ICD-10-CM | POA: Diagnosis not present

## 2021-11-29 DIAGNOSIS — S31819D Unspecified open wound of right buttock, subsequent encounter: Secondary | ICD-10-CM | POA: Diagnosis not present

## 2021-11-29 DIAGNOSIS — E1122 Type 2 diabetes mellitus with diabetic chronic kidney disease: Secondary | ICD-10-CM | POA: Diagnosis not present

## 2021-11-30 ENCOUNTER — Encounter: Payer: Self-pay | Admitting: Sports Medicine

## 2021-11-30 ENCOUNTER — Encounter: Payer: Self-pay | Admitting: Internal Medicine

## 2021-11-30 ENCOUNTER — Ambulatory Visit (INDEPENDENT_AMBULATORY_CARE_PROVIDER_SITE_OTHER): Payer: Medicare Other | Admitting: Sports Medicine

## 2021-11-30 ENCOUNTER — Other Ambulatory Visit: Payer: Self-pay

## 2021-11-30 DIAGNOSIS — I739 Peripheral vascular disease, unspecified: Secondary | ICD-10-CM

## 2021-11-30 DIAGNOSIS — M79675 Pain in left toe(s): Secondary | ICD-10-CM

## 2021-11-30 DIAGNOSIS — L0390 Cellulitis, unspecified: Secondary | ICD-10-CM | POA: Diagnosis not present

## 2021-11-30 DIAGNOSIS — M79674 Pain in right toe(s): Secondary | ICD-10-CM

## 2021-11-30 DIAGNOSIS — B351 Tinea unguium: Secondary | ICD-10-CM

## 2021-11-30 DIAGNOSIS — E1149 Type 2 diabetes mellitus with other diabetic neurological complication: Secondary | ICD-10-CM

## 2021-11-30 DIAGNOSIS — E1140 Type 2 diabetes mellitus with diabetic neuropathy, unspecified: Secondary | ICD-10-CM

## 2021-11-30 DIAGNOSIS — R5381 Other malaise: Secondary | ICD-10-CM | POA: Diagnosis not present

## 2021-11-30 DIAGNOSIS — D509 Iron deficiency anemia, unspecified: Secondary | ICD-10-CM | POA: Diagnosis not present

## 2021-11-30 MED ORDER — NEOMYCIN-POLYMYXIN-HC 3.5-10000-1 OT SOLN: OTIC | 0 refills | Status: DC

## 2021-11-30 NOTE — Progress Notes (Signed)
Subjective: Jeremiah Beasley. is a 76 y.o. male patient with history of diabetes who presents to office today complaining of long,mildly painful nails and concerned of bleeding from the right great toe had a fall several weeks ago and also bumped the toe but then was admitted to the hospital and treated for iron deficiency and states that he is very weak still as reported by his wife.  Patient Active Problem List   Diagnosis Date Noted   SIRS (systemic inflammatory response syndrome) ([place]) 11/26/2021   Iron deficiency anemia 11/26/2021   Diabetic neuropathy ([place]) 11/26/2021   Sacral decubitus ulcer 11/26/2021   Benign neoplasm of ascending colon    Benign neoplasm of transverse colon    Anemia 11/23/2021   Severe protein-calorie malnutrition ([place]) 11/23/2021   Cellulitis of buttock 11/23/2021   Decubitus ulcers 11/23/2021   Sepsis ([place]) 11/23/2021   MRSA (methicillin resistant staph aureus) culture positive 11/02/2021   Bradycardia 10/10/2021   Hyponatremia 10/06/2021   Generalized weakness 10/06/2021   Type 1 diabetes with stage 3 chronic kidney disease moderate GFR 30-59 ([place]) 09/11/2021   CKD (chronic kidney disease), stage III ([place]) 09/11/2021   Hyperlipidemia 07/14/2021   Body mass index (BMI) 28.0-28.9, adult 01/10/2021   Chronic pain syndrome 01/10/2021   On anticoagulant therapy 01/10/2021   Primary osteoarthritis of right knee 01/10/2021   Hypertension    Pneumonia 10/20/2020   S/P right knee arthroscopy 04/25/2020   Chronic pain of right knee 04/11/2020   Essential (primary) hypertension 05/15/2015   Paroxysmal atrial fibrillation ([place]) 05/15/2015   Pure hypercholesterolemia 05/15/2015   Type 2 diabetes mellitus without complication ([place]) 32/20/2542   Current Outpatient Medications on File Prior to Visit  Medication Sig Dispense Refill   acetaminophen (TYLENOL) 325 MG tablet Take 2 tablets (650 mg total) by mouth every 6 (six) hours as needed for mild pain, fever or  headache.     amiodarone (PACERONE) 200 MG tablet Take 100 mg by mouth daily.     amLODipine (NORVASC) 5 MG tablet Take 5 mg by mouth daily.     cephALEXin (KEFLEX) 250 MG capsule Take 1 capsule (250 mg total) by mouth 3 (three) times daily for 3 days. 9 capsule 0   diphenhydrAMINE (BENADRYL) 25 mg capsule Take 25 mg by mouth at bedtime.     ezetimibe (ZETIA) 10 MG tablet Take 1 tablet (10 mg total) by mouth daily. 90 tablet 3   ferrous sulfate 325 (65 FE) MG tablet Take 1 tablet (325 mg total) by mouth 2 (two) times daily with a meal. 60 tablet 1   gabapentin (NEURONTIN) 300 MG capsule Take 1 capsule (300 mg total) by mouth 2 (two) times daily. 180 capsule 3   hydrALAZINE (APRESOLINE) 100 MG tablet Take 0.5 tablets (50 mg total) by mouth 2 (two) times daily.     Insulin Human (INSULIN PUMP) SOLN Inject 1 each into the skin continuous. Resume Insulin pump this evening     levothyroxine (SYNTHROID, LEVOTHROID) 75 MCG tablet Take 75 mcg by mouth daily before breakfast.     Multiple Vitamins-Minerals (CENTRUM SILVER ADULT 50+ PO) Take 1 tablet by mouth daily with breakfast.     omeprazole (PRILOSEC) 40 MG capsule Take 40 mg by mouth daily before supper.     rivaroxaban (XARELTO) 20 MG TABS tablet Take 1 tablet (20 mg total) by mouth daily with supper.     simvastatin (ZOCOR) 80 MG tablet Take 80 mg by mouth daily.  No current facility-administered medications on file prior to visit.   Allergies  Allergen Reactions   Quinapril Shortness Of Breath, Swelling and Other (See Comments)    TONGUE SWELLLING   Methaqualone Other (See Comments)    Unknown to patient   Tape Rash and Other (See Comments)    Skin is sensitive!!    Recent Results (from the past 2160 hour(s))  Basic metabolic panel     Status: Abnormal   Collection Time: 10/06/21  9:20 AM  Result Value Ref Range   Sodium 126 (L) 135 - 145 mmol/L   Potassium 3.6 3.5 - 5.1 mmol/L   Chloride 94 (L) 98 - 111 mmol/L   CO2 23 22 - 32  mmol/L   Glucose, Bld 154 (H) 70 - 99 mg/dL    Comment: Glucose reference range applies only to samples taken after fasting for at least 8 hours.   BUN 28 (H) 8 - 23 mg/dL   Creatinine, Ser 1.96 (H) 0.61 - 1.24 mg/dL   Calcium 8.4 (L) 8.9 - 10.3 mg/dL   GFR, Estimated 36 (L) >60 mL/min    Comment: (NOTE) Calculated using the CKD-EPI Creatinine Equation (2021)    Anion gap 9 5 - 15    Comment: Performed at [facility], [address] [place], [place] [address]  CBC     Status: Abnormal   Collection Time: 10/06/21  9:20 AM  Result Value Ref Range   WBC 6.7 4.0 - 10.5 K/uL   RBC 3.36 (L) 4.22 - 5.81 MIL/uL   Hemoglobin 10.1 (L) 13.0 - 17.0 g/dL   HCT 29.9 (L) 39.0 - 52.0 %   MCV 89.0 80.0 - 100.0 fL   MCH 30.1 26.0 - 34.0 pg   MCHC 33.8 30.0 - 36.0 g/dL   RDW 13.0 11.5 - 15.5 %   Platelets 255 150 - 400 K/uL   nRBC 0.0 0.0 - 0.2 %    Comment: Performed at [facility], [address] [place], [place] [address]  CBG monitoring, ED     Status: Abnormal   Collection Time: 10/06/21  9:49 AM  Result Value Ref Range   Glucose-Capillary 160 (H) 70 - 99 mg/dL    Comment: Glucose reference range applies only to samples taken after fasting for at least 8 hours.  Blood culture (routine x 2)     Status: None   Collection Time: 10/06/21  9:50 AM   Specimen: BLOOD RIGHT ARM  Result Value Ref Range   Specimen Description      BLOOD RIGHT ARM BLOOD Performed at [facility], [address] [place], [place] [address]    Special Requests      Blood Culture adequate volume BOTTLES DRAWN AEROBIC AND ANAEROBIC Performed at [facility], [address] [place], [place] [address]    Culture      NO GROWTH 5 DAYS Performed at [facility], [place] [address] [place], [place] [address]    Report Status 10/11/2021 FINAL   Resp Panel by RT-PCR (Flu A&B, Covid) Nasopharyngeal Swab     Status: None   Collection Time: 10/06/21 10:14 AM   Specimen:  Nasopharyngeal Swab; Nasopharyngeal(NP) swabs in vial transport medium  Result Value Ref Range   SARS Coronavirus 2 by RT PCR NEGATIVE NEGATIVE    Comment: (NOTE) SARS-CoV-2 target nucleic acids are NOT DETECTED.  The SARS-CoV-2 RNA is generally detectable in upper respiratory specimens  during the acute phase of infection. The lowest concentration of SARS-CoV-2 viral copies this assay can detect is 138 copies/mL. A negative result does not preclude SARS-Cov-2 infection and should not be used as the sole basis for treatment or other patient management decisions. A negative result may occur with  improper specimen collection/handling, submission of specimen other than nasopharyngeal swab, presence of viral mutation(s) within the areas targeted by this assay, and inadequate number of viral copies(<138 copies/mL). A negative result must be combined with clinical observations, patient history, and epidemiological information. The expected result is Negative.  Fact Sheet for Patients:  EntrepreneurPulse.com.au  Fact Sheet for Healthcare Providers:  IncredibleEmployment.be  This test is no t yet approved or cleared by the [place] FDA and  has been authorized for detection and/or diagnosis of SARS-CoV-2 by FDA under an Emergency Use Authorization (EUA). This EUA will remain  in effect (meaning this test can be used) for the duration of the COVID-19 declaration under Section 564(b)(1) of the Act, 21 U.S.C.section 360bbb-3(b)(1), unless the authorization is terminated  or revoked sooner.       Influenza A by PCR NEGATIVE NEGATIVE   Influenza B by PCR NEGATIVE NEGATIVE    Comment: (NOTE) The Xpert Xpress SARS-CoV-2/FLU/RSV plus assay is intended as an aid in the diagnosis of influenza from Nasopharyngeal swab specimens and should not be used as a sole basis for treatment. Nasal washings and aspirates are unacceptable for Xpert Xpress  SARS-CoV-2/FLU/RSV testing.  Fact Sheet for Patients: EntrepreneurPulse.com.au  Fact Sheet for Healthcare Providers: IncredibleEmployment.be  This test is not yet approved or cleared by the [place] FDA and has been authorized for detection and/or diagnosis of SARS-CoV-2 by FDA under an Emergency Use Authorization (EUA). This EUA will remain in effect (meaning this test can be used) for the duration of the COVID-19 declaration under Section 564(b)(1) of the Act, 21 U.S.C. section 360bbb-3(b)(1), unless the authorization is terminated or revoked.  Performed at [facility], [address] [place], [place] [address]   Urinalysis, Routine w reflex microscopic Urine, Clean Catch     Status: Abnormal   Collection Time: 10/06/21 12:16 PM  Result Value Ref Range   Color, Urine YELLOW YELLOW   APPearance CLEAR CLEAR   Specific Gravity, Urine 1.010 1.005 - 1.030   pH 5.5 5.0 - 8.0   Glucose, UA NEGATIVE NEGATIVE mg/dL   Hgb urine dipstick TRACE (A) NEGATIVE   Bilirubin Urine NEGATIVE NEGATIVE   Ketones, ur NEGATIVE NEGATIVE mg/dL   Protein, ur NEGATIVE NEGATIVE mg/dL   Nitrite NEGATIVE NEGATIVE   Leukocytes,Ua NEGATIVE NEGATIVE    Comment: Performed at [facility], [address] [place], [place] [address]  Lactic acid, plasma     Status: Abnormal   Collection Time: 10/06/21 12:16 PM  Result Value Ref Range   Lactic Acid, Venous 2.7 (HH) 0.5 - 1.9 mmol/L    Comment: CRITICAL RESULT CALLED TO, READ BACK BY AND VERIFIED WITH: Mitzie Na, RN AT [id number] ON [id number] BY Asher Muir Performed at [facility], [address] [place], [place] [address]   Comprehensive metabolic panel     Status: Abnormal   Collection Time: 10/06/21 12:16 PM  Result Value Ref Range   Sodium 129 (L) 135 - 145 mmol/L   Potassium 4.0 3.5 - 5.1 mmol/L   Chloride 94 (L) 98 - 111 mmol/L   CO2 25 22 - 32 mmol/L   Glucose, Bld 169 (  H) 70 -  99 mg/dL    Comment: Glucose reference range applies only to samples taken after fasting for at least 8 hours.   BUN 27 (H) 8 - 23 mg/dL   Creatinine, Ser 2.07 (H) 0.61 - 1.24 mg/dL   Calcium 8.6 (L) 8.9 - 10.3 mg/dL   Total Protein 7.8 6.5 - 8.1 g/dL   Albumin 3.5 3.5 - 5.0 g/dL   AST 30 15 - 41 U/L   ALT 25 0 - 44 U/L   Alkaline Phosphatase 84 38 - 126 U/L   Total Bilirubin 0.6 0.3 - 1.2 mg/dL   GFR, Estimated 33 (L) >60 mL/min    Comment: (NOTE) Calculated using the CKD-EPI Creatinine Equation (2021)    Anion gap 10 5 - 15    Comment: Performed at [facility], [address] [place], [place] [address]  CBC WITH DIFFERENTIAL     Status: Abnormal   Collection Time: 10/06/21 12:16 PM  Result Value Ref Range   WBC 9.1 4.0 - 10.5 K/uL   RBC 3.46 (L) 4.22 - 5.81 MIL/uL   Hemoglobin 10.2 (L) 13.0 - 17.0 g/dL   HCT 31.2 (L) 39.0 - 52.0 %   MCV 90.2 80.0 - 100.0 fL   MCH 29.5 26.0 - 34.0 pg   MCHC 32.7 30.0 - 36.0 g/dL   RDW 13.2 11.5 - 15.5 %   Platelets 177 150 - 400 K/uL   nRBC 0.0 0.0 - 0.2 %   Neutrophils Relative % 79 %   Neutro Abs 7.3 1.7 - 7.7 K/uL   Lymphocytes Relative 11 %   Lymphs Abs 1.0 0.7 - 4.0 K/uL   Monocytes Relative 7 %   Monocytes Absolute 0.6 0.1 - 1.0 K/uL   Eosinophils Relative 1 %   Eosinophils Absolute 0.1 0.0 - 0.5 K/uL   Basophils Relative 1 %   Basophils Absolute 0.1 0.0 - 0.1 K/uL   Immature Granulocytes 1 %   Abs Immature Granulocytes 0.06 0.00 - 0.07 K/uL    Comment: Performed at [facility], [address] [place], [place] [address]  Protime-INR     Status: Abnormal   Collection Time: 10/06/21 12:16 PM  Result Value Ref Range   Prothrombin Time 24.7 (H) 11.4 - 15.2 seconds   INR 2.2 (H) 0.8 - 1.2    Comment: (NOTE) INR goal varies based on device and disease states. Performed at [facility], [address] [place], [place] [address]   APTT     Status: Abnormal   Collection Time: 10/06/21 12:16 PM   Result Value Ref Range   aPTT 65 (H) 24 - 36 seconds    Comment:        IF BASELINE aPTT IS ELEVATED, SUGGEST PATIENT RISK ASSESSMENT BE USED TO DETERMINE APPROPRIATE ANTICOAGULANT THERAPY. Performed at [facility], [address] [place], [place] [address]   Urine Culture     Status: Abnormal   Collection Time: 10/06/21 12:16 PM   Specimen: In/Out Cath Urine  Result Value Ref Range   Specimen Description      IN/OUT CATH URINE Performed at [facility], [address] [place], [place] [address]    Special Requests      NONE Performed at [facility], [address] [place], [place] [address]    Culture 1,000 COLONIES/mL ENTEROCOCCUS FAECALIS (A)    Report Status 10/08/2021 FINAL  Organism ID, Bacteria ENTEROCOCCUS FAECALIS (A)       Susceptibility   Enterococcus faecalis - MIC*    AMPICILLIN <=2 SENSITIVE Sensitive     NITROFURANTOIN <=16 SENSITIVE Sensitive     VANCOMYCIN 1 SENSITIVE Sensitive     * 1,000 COLONIES/mL ENTEROCOCCUS FAECALIS  Brain natriuretic peptide     Status: None   Collection Time: 10/06/21 12:16 PM  Result Value Ref Range   B Natriuretic Peptide 88.7 0.0 - 100.0 pg/mL    Comment: Performed at [facility], [address] [place], [place] [address]  Ammonia     Status: None   Collection Time: 10/06/21 12:16 PM  Result Value Ref Range   Ammonia 14 9 - 35 umol/L    Comment: Performed at [facility], [address] [place], [place] [address]  Urinalysis, Microscopic (reflex)     Status: Abnormal   Collection Time: 10/06/21 12:16 PM  Result Value Ref Range   RBC / HPF 6-10 0 - 5 RBC/hpf   WBC, UA 0-5 0 - 5 WBC/hpf   Bacteria, UA FEW (A) NONE SEEN   Squamous Epithelial / LPF 0-5 0 - 5    Comment: Performed at [facility], [address] [place], [place] [address]  Blood Culture (routine x 2)     Status: None   Collection Time: 10/06/21 12:30 PM   Specimen: Left Antecubital;  Blood  Result Value Ref Range   Specimen Description      LEFT ANTECUBITAL Performed at [facility], [address] [place], [place] [address]    Special Requests      BOTTLES DRAWN AEROBIC AND ANAEROBIC Blood Culture adequate volume Performed at [facility], [address] [place], [place] [address]    Culture      NO GROWTH 5 DAYS Performed at [facility], [place] [address] [place], [place] [address]    Report Status 10/11/2021 FINAL   CBG monitoring, ED     Status: Abnormal   Collection Time: 10/06/21  3:10 PM  Result Value Ref Range   Glucose-Capillary 159 (H) 70 - 99 mg/dL    Comment: Glucose reference range applies only to samples taken after fasting for at least 8 hours.  Differential     Status: Abnormal   Collection Time: 10/06/21  4:53 PM  Result Value Ref Range   Neutrophils Relative % 79 %   Neutro Abs 3.7 1.7 - 7.7 K/uL   Lymphocytes Relative 11 %   Lymphs Abs 0.5 (L) 0.7 - 4.0 K/uL   Monocytes Relative 8 %   Monocytes Absolute 0.4 0.1 - 1.0 K/uL   Eosinophils Relative 1 %   Eosinophils Absolute 0.0 0.0 - 0.5 K/uL   Basophils Relative 0 %   Basophils Absolute 0.0 0.0 - 0.1 K/uL   Immature Granulocytes 1 %   Abs Immature Granulocytes 0.03 0.00 - 0.07 K/uL    Comment: Performed at [facility], [place] [address] [place], [place] [address]  Blood culture (routine x 2)     Status: None   Collection Time: 10/06/21  4:53 PM   Specimen: BLOOD  Result Value Ref Range   Specimen Description      BLOOD BLOOD RIGHT FOREARM Performed at [place] [address] [place], [place] [address]    Special Requests      BOTTLES DRAWN AEROBIC ONLY Blood Culture  adequate volume Performed at [place] [address] [place], [place] [address]    Culture      NO GROWTH 5 DAYS Performed at [facility], [place] [address] [place], [place] [address]    Report Status 10/12/2021  FINAL   Lactic acid, plasma     Status: None   Collection Time: 10/06/21  4:53 PM  Result Value Ref Range   Lactic Acid, Venous 0.9 0.5 - 1.9 mmol/L    Comment: Performed at [facility], [place] [address], [place], [place] [address]  Blood Culture (routine x 2)     Status: None   Collection Time: 10/06/21  4:53 PM   Specimen: BLOOD  Result Value Ref Range   Specimen Description      BLOOD BLOOD RIGHT HAND Performed at [place] [address], [place], [place] [address]    Special Requests      BOTTLES DRAWN AEROBIC ONLY Blood Culture adequate volume Performed at [place] [address] [place], [place] [address]    Culture      NO GROWTH 5 DAYS Performed at [facility], [place] [address] [place], [place] [address]    Report Status 10/12/2021 FINAL   CBC     Status: Abnormal   Collection Time: 10/06/21  4:53 PM  Result Value Ref Range   WBC 4.6 4.0 - 10.5 K/uL   RBC 2.84 (L) 4.22 - 5.81 MIL/uL   Hemoglobin 8.4 (L) 13.0 - 17.0 g/dL   HCT 25.6 (L) 39.0 - 52.0 %   MCV 90.1 80.0 - 100.0 fL   MCH 29.6 26.0 - 34.0 pg   MCHC 32.8 30.0 - 36.0 g/dL   RDW 13.1 11.5 - 15.5 %   Platelets 186 150 - 400 K/uL    Comment: SPECIMEN CHECKED FOR CLOTS REPEATED TO VERIFY PLATELET COUNT CONFIRMED BY SMEAR    nRBC 0.0 0.0 - 0.2 %    Comment: Performed at [facility], [place] [address], [place], [place] [address]  Glucose, capillary     Status: Abnormal   Collection Time: 10/06/21  8:14 PM  Result Value Ref Range   Glucose-Capillary 181 (H) 70 - 99 mg/dL    Comment: Glucose reference range applies only to samples taken after fasting for at least 8 hours.  Iron and TIBC     Status: Abnormal   Collection Time: 10/07/21  4:41 AM  Result Value Ref Range   Iron 29 (L) 45 - 182 ug/dL   TIBC 178 (L) 250 - 450 ug/dL   Saturation Ratios 16 (L) 17.9 - 39.5 %   UIBC 149 ug/dL    Comment: Performed at [facility], [place] [address] [place], [place] [address]  Ferritin     Status: None   Collection Time: 10/07/21  4:41 AM  Result Value Ref Range   Ferritin 73 24 - 336 ng/mL    Comment: Performed at [facility], [place] [address], [place], [place] [address]  TSH     Status: Abnormal   Collection Time: 10/07/21  4:46 AM  Result Value Ref Range   TSH 5.005 (H) 0.350 - 4.500 uIU/mL    Comment: Performed by a 3rd Generation assay with a functional sensitivity of <=0.01 uIU/mL. Performed at [facility], [place] [address] [place], [place] [address]   CBC     Status: Abnormal   Collection  Time: 10/07/21  4:46 AM  Result Value Ref Range   WBC 3.6 (L) 4.0 - 10.5 K/uL   RBC 2.71 (L) 4.22 - 5.81 MIL/uL   Hemoglobin 8.1 (L) 13.0 - 17.0 g/dL   HCT 24.3 (L) 39.0 - 52.0 %   MCV 89.7 80.0 - 100.0 fL   MCH 29.9 26.0 - 34.0 pg   MCHC 33.3 30.0 - 36.0 g/dL   RDW 13.0 11.5 - 15.5 %   Platelets 196 150 - 400 K/uL   nRBC 0.0 0.0 - 0.2 %    Comment: Performed at [facility], [place] [address] [place], [place] [address]  Comprehensive metabolic panel     Status: Abnormal   Collection Time: 10/07/21  4:46 AM  Result Value Ref Range   Sodium 130 (L) 135 - 145 mmol/L   Potassium 4.0 3.5 - 5.1 mmol/L   Chloride 101 98 - 111 mmol/L   CO2 22 22 - 32 mmol/L   Glucose, Bld 312 (H) 70 - 99 mg/dL    Comment: Glucose reference range applies only to samples taken after fasting for at least 8 hours.   BUN 26 (H) 8 - 23 mg/dL   Creatinine, Ser 1.92 (H) 0.61 - 1.24 mg/dL   Calcium 7.9 (L) 8.9 - 10.3 mg/dL   Total Protein 6.2 (L) 6.5 - 8.1 g/dL   Albumin 2.7 (L) 3.5 - 5.0 g/dL   AST 20 15 - 41 U/L   ALT 18 0 - 44 U/L   Alkaline Phosphatase 64 38 - 126 U/L   Total Bilirubin 0.9 0.3 - 1.2 mg/dL   GFR, Estimated 37 (L) >60 mL/min    Comment: (NOTE) Calculated using the CKD-EPI Creatinine Equation (2021)    Anion gap 7 5 - 15    Comment: Performed at  [facility], [place] [address] [place], [place] [address]  Magnesium     Status: None   Collection Time: 10/07/21  4:46 AM  Result Value Ref Range   Magnesium 1.9 1.7 - 2.4 mg/dL    Comment: Performed at [facility], [place] [address] [place], [place] [address]  Phosphorus     Status: None   Collection Time: 10/07/21  4:46 AM  Result Value Ref Range   Phosphorus 3.1 2.5 - 4.6 mg/dL    Comment: Performed at [facility], [place] [address] [place], [place] [address]  Procalcitonin - Baseline     Status: None   Collection Time: 10/07/21  4:46 AM  Result Value Ref Range   Procalcitonin 0.21 ng/mL    Comment:        Interpretation: PCT (Procalcitonin) <= 0.5 ng/mL: Systemic infection (sepsis) is not likely. Local bacterial infection is possible. (NOTE)       Sepsis PCT Algorithm           Lower Respiratory Tract                                      Infection PCT Algorithm    ----------------------------     ----------------------------         PCT < 0.25 ng/mL                PCT < 0.10 ng/mL          Strongly encourage             Strongly discourage  discontinuation of antibiotics    initiation of antibiotics    ----------------------------     -----------------------------       PCT 0.25 - 0.50 ng/mL            PCT 0.10 - 0.25 ng/mL               OR       >80% decrease in PCT            Discourage initiation of                                            antibiotics      Encourage discontinuation           of antibiotics    ----------------------------     -----------------------------         PCT >= 0.50 ng/mL              PCT 0.26 - 0.50 ng/mL               AND        <80% decrease in PCT             Encourage initiation of                                             antibiotics       Encourage continuation           of antibiotics    ----------------------------     -----------------------------        PCT >= 0.50 ng/mL                   PCT > 0.50 ng/mL               AND         increase in PCT                  Strongly encourage                                      initiation of antibiotics    Strongly encourage escalation           of antibiotics                                     -----------------------------                                           PCT <= 0.25 ng/mL                                                 OR                                        >  80% decrease in PCT                                      Discontinue / Do not initiate                                             antibiotics  Performed at [place] [address] [place], [place] [address]   Lactic acid, plasma     Status: None   Collection Time: 10/07/21  4:46 AM  Result Value Ref Range   Lactic Acid, Venous 0.7 0.5 - 1.9 mmol/L    Comment: Performed at [facility], [place] [address], [place], [place] [address]  Hemoglobin A1c     Status: Abnormal   Collection Time: 10/07/21  4:46 AM  Result Value Ref Range   Hgb A1c MFr Bld 6.0 (H) 4.8 - 5.6 %    Comment: (NOTE)         Prediabetes: 5.7 - 6.4         Diabetes: >6.4         Glycemic control for adults with diabetes: <7.0    Mean Plasma Glucose 126 mg/dL    Comment: (NOTE) Performed At: [facility] [place], [place] [id number] Rush Farmer MD FO:[id number]   CK     Status: Abnormal   Collection Time: 10/07/21  4:46 AM  Result Value Ref Range   Total CK 32 (L) 49 - 397 U/L    Comment: Performed at [facility], [place] [address], [place], [place] [address]  Reticulocytes     Status: Abnormal   Collection Time: 10/07/21  4:46 AM  Result Value Ref Range   Retic Ct Pct 1.9 0.4 - 3.1 %   RBC. 2.73 (L) 4.22 - 5.81 MIL/uL   Retic Count, Absolute 52.1 19.0 - 186.0 K/uL   Immature Retic Fract 19.9 (H) 2.3 - 15.9 %    Comment: Performed at [facility], [place] [address], [place], [place] [address]  Glucose, capillary     Status: Abnormal   Collection Time: 10/07/21  7:40 AM  Result Value Ref Range   Glucose-Capillary 323 (H) 70 - 99 mg/dL    Comment: Glucose reference range applies only to samples taken after fasting for at least 8 hours.   Comment 1 Notify RN   T4, free     Status: Abnormal   Collection Time: 10/07/21 11:04 AM  Result Value Ref Range   Free T4 1.13 (H) 0.61 - 1.12 ng/dL    Comment: (NOTE) Biotin ingestion may interfere with free T4 tests. If the results are inconsistent with the TSH level, previous test results, or the clinical presentation, then consider biotin interference. If needed, order repeat testing after stopping biotin. Performed at [facility], [place] [address] [place], [place] [address]   Glucose, capillary     Status: Abnormal   Collection Time: 10/07/21 11:23 AM  Result Value Ref Range   Glucose-Capillary 274 (H) 70 - 99 mg/dL    Comment: Glucose reference range applies only to samples taken after fasting for at least 8 hours.   Comment 1 Notify RN   Glucose, capillary     Status: Abnormal   Collection Time:  10/07/21  4:27 PM  Result Value Ref Range   Glucose-Capillary 230 (H) 70 - 99 mg/dL    Comment: Glucose reference range applies only to samples taken after fasting for at least 8 hours.  Glucose, capillary     Status: Abnormal   Collection Time: 10/07/21  7:36 PM  Result Value Ref Range   Glucose-Capillary 220 (H) 70 - 99 mg/dL    Comment: Glucose reference range applies only to samples taken after fasting for at least 8 hours.  Basic metabolic panel     Status: Abnormal   Collection Time: 10/08/21  4:36 AM  Result Value Ref Range   Sodium 132 (L) 135 - 145 mmol/L   Potassium 4.1 3.5 - 5.1 mmol/L   Chloride 103 98 - 111 mmol/L   CO2 24 22 - 32 mmol/L   Glucose, Bld 254 (H) 70 - 99 mg/dL    Comment: Glucose reference range applies only to samples taken after fasting for at least 8 hours.   BUN 25  (H) 8 - 23 mg/dL   Creatinine, Ser 1.94 (H) 0.61 - 1.24 mg/dL   Calcium 7.9 (L) 8.9 - 10.3 mg/dL   GFR, Estimated 36 (L) >60 mL/min    Comment: (NOTE) Calculated using the CKD-EPI Creatinine Equation (2021)    Anion gap 5 5 - 15    Comment: Performed at [facility], [place] [address] [place], [place] [address]  CBC with Differential/Platelet     Status: Abnormal   Collection Time: 10/08/21  4:36 AM  Result Value Ref Range   WBC 3.6 (L) 4.0 - 10.5 K/uL   RBC 2.54 (L) 4.22 - 5.81 MIL/uL   Hemoglobin 7.7 (L) 13.0 - 17.0 g/dL   HCT 23.5 (L) 39.0 - 52.0 %   MCV 92.5 80.0 - 100.0 fL   MCH 30.3 26.0 - 34.0 pg   MCHC 32.8 30.0 - 36.0 g/dL   RDW 13.1 11.5 - 15.5 %   Platelets 191 150 - 400 K/uL   nRBC 0.0 0.0 - 0.2 %   Neutrophils Relative % 63 %   Neutro Abs 2.3 1.7 - 7.7 K/uL   Lymphocytes Relative 21 %   Lymphs Abs 0.7 0.7 - 4.0 K/uL   Monocytes Relative 10 %   Monocytes Absolute 0.4 0.1 - 1.0 K/uL   Eosinophils Relative 4 %   Eosinophils Absolute 0.1 0.0 - 0.5 K/uL   Basophils Relative 1 %   Basophils Absolute 0.0 0.0 - 0.1 K/uL   Immature Granulocytes 1 %   Abs Immature Granulocytes 0.02 0.00 - 0.07 K/uL    Comment: Performed at [facility], [place] [address], [place], [place] [address]  Magnesium     Status: None   Collection Time: 10/08/21  4:36 AM  Result Value Ref Range   Magnesium 2.0 1.7 - 2.4 mg/dL    Comment: Performed at [facility], [place] [address] [place], [place] [address]  Phosphorus     Status: None   Collection Time: 10/08/21  4:36 AM  Result Value Ref Range   Phosphorus 3.5 2.5 - 4.6 mg/dL    Comment: Performed at [facility], [place] [address] [place],  [address]  CK     Status: Abnormal   Collection Time: 10/08/21  4:36 AM  Result Value Ref Range   Total CK 22 (L) 49 - 397 U/L    Comment: Performed at [facility], [place] [address]  [place], [place] [address]   Glucose, capillary     Status: Abnormal   Collection Time: 10/08/21  7:36 AM  Result Value Ref Range   Glucose-Capillary 270 (H) 70 - 99 mg/dL    Comment: Glucose reference range applies only to samples taken after fasting for at least 8 hours.  Glucose, capillary     Status: Abnormal   Collection Time: 10/08/21 11:56 AM  Result Value Ref Range   Glucose-Capillary 233 (H) 70 - 99 mg/dL    Comment: Glucose reference range applies only to samples taken after fasting for at least 8 hours.  Glucose, capillary     Status: Abnormal   Collection Time: 10/08/21  4:26 PM  Result Value Ref Range   Glucose-Capillary 181 (H) 70 - 99 mg/dL    Comment: Glucose reference range applies only to samples taken after fasting for at least 8 hours.  Glucose, capillary     Status: Abnormal   Collection Time: 10/08/21  7:41 PM  Result Value Ref Range   Glucose-Capillary 136 (H) 70 - 99 mg/dL    Comment: Glucose reference range applies only to samples taken after fasting for at least 8 hours.  Glucose, capillary     Status: Abnormal   Collection Time: 10/09/21  7:51 AM  Result Value Ref Range   Glucose-Capillary 142 (H) 70 - 99 mg/dL    Comment: Glucose reference range applies only to samples taken after fasting for at least 8 hours.  Basic metabolic panel     Status: Abnormal   Collection Time: 10/09/21  9:09 AM  Result Value Ref Range   Sodium 135 135 - 145 mmol/L   Potassium 3.9 3.5 - 5.1 mmol/L   Chloride 103 98 - 111 mmol/L   CO2 24 22 - 32 mmol/L   Glucose, Bld 211 (H) 70 - 99 mg/dL    Comment: Glucose reference range applies only to samples taken after fasting for at least 8 hours.   BUN 22 8 - 23 mg/dL   Creatinine, Ser 1.87 (H) 0.61 - 1.24 mg/dL   Calcium 8.5 (L) 8.9 - 10.3 mg/dL   GFR, Estimated 38 (L) >60 mL/min    Comment: (NOTE) Calculated using the CKD-EPI Creatinine Equation (2021)    Anion gap 8 5 - 15    Comment: Performed at [facility], [place] [address],  [place], [place] [address]  CBC     Status: Abnormal   Collection Time: 10/09/21  9:09 AM  Result Value Ref Range   WBC 4.5 4.0 - 10.5 K/uL   RBC 3.13 (L) 4.22 - 5.81 MIL/uL   Hemoglobin 9.5 (L) 13.0 - 17.0 g/dL   HCT 29.0 (L) 39.0 - 52.0 %   MCV 92.7 80.0 - 100.0 fL   MCH 30.4 26.0 - 34.0 pg   MCHC 32.8 30.0 - 36.0 g/dL   RDW 13.3 11.5 - 15.5 %   Platelets 247 150 - 400 K/uL   nRBC 0.0 0.0 - 0.2 %    Comment: Performed at [facility], [place] [address] [place], [place] [address]  ABO/Rh     Status: None   Collection Time: 10/09/21  9:09 AM  Result Value Ref Range   ABO/RH(D)      A NEG Performed at [place] [address] [place], [place] [address]   Type and screen [place]     Status: None   Collection Time: 10/09/21 10:42 AM  Result  Value Ref Range   ABO/RH(D) A NEG    Antibody Screen NEG    Sample Expiration 10/12/2021,2359    Unit Number [id number]    Blood Component Type RED CELLS,LR    Unit division 00    Status of Unit ISSUED,FINAL    Transfusion Status OK TO TRANSFUSE    Crossmatch Result Compatible    Unit Number [id number]    Blood Component Type RED CELLS,LR    Unit division 00    Status of Unit REL FROM [facility]    Transfusion Status OK TO TRANSFUSE    Crossmatch Result      Compatible Performed at [facility], [place] [address] [place], [place] [address]   Prepare RBC (crossmatch)     Status: None   Collection Time: 10/09/21 10:42 AM  Result Value Ref Range   Order Confirmation      ORDER PROCESSED BY BLOOD BANK Performed at [facility], [place] [address], [place], [place] [address]   BPAM RBC     Status: None   Collection Time: 10/09/21 10:42 AM  Result Value Ref Range   ISSUE DATE / TIME 202211221327    Blood Product Unit Number [id number]    PRODUCT CODE [id number]    Unit Type and Rh 0600    Blood Product Expiration Date 202212262359    Blood  Product Unit Number [id number]    PRODUCT CODE [id number]    Unit Type and Rh 0600    Blood Product Expiration Date 697948016553   Glucose, capillary     Status: Abnormal   Collection Time: 10/09/21 11:12 AM  Result Value Ref Range   Glucose-Capillary 283 (H) 70 - 99 mg/dL    Comment: Glucose reference range applies only to samples taken after fasting for at least 8 hours.  Glucose, capillary     Status: Abnormal   Collection Time: 10/09/21  4:27 PM  Result Value Ref Range   Glucose-Capillary 127 (H) 70 - 99 mg/dL    Comment: Glucose reference range applies only to samples taken after fasting for at least 8 hours.  Hemoglobin and hematocrit, blood     Status: Abnormal   Collection Time: 10/09/21  7:18 PM  Result Value Ref Range   Hemoglobin 9.2 (L) 13.0 - 17.0 g/dL   HCT 27.8 (L) 39.0 - 52.0 %    Comment: Performed at [facility], [place] [address], [place], [place] [address]  Glucose, capillary     Status: Abnormal   Collection Time: 10/09/21  8:12 PM  Result Value Ref Range   Glucose-Capillary 42 (LL) 70 - 99 mg/dL    Comment: Glucose reference range applies only to samples taken after fasting for at least 8 hours.   Comment 1 Notify RN   Glucose, capillary     Status: Abnormal   Collection Time: 10/09/21  8:29 PM  Result Value Ref Range   Glucose-Capillary 56 (L) 70 - 99 mg/dL    Comment: Glucose reference range applies only to samples taken after fasting for at least 8 hours.  Glucose, capillary     Status: Abnormal   Collection Time: 10/09/21  9:28 PM  Result Value Ref Range   Glucose-Capillary 121 (H) 70 - 99 mg/dL    Comment: Glucose reference range applies only to samples taken after fasting for at least 8 hours.  Glucose, capillary     Status: Abnormal   Collection Time: 10/10/21  3:01 AM  Result Value Ref Range  Glucose-Capillary 146 (H) 70 - 99 mg/dL    Comment: Glucose reference range applies only to samples taken after fasting for at least 8  hours.  Basic metabolic panel     Status: Abnormal   Collection Time: 10/10/21  4:38 AM  Result Value Ref Range   Sodium 136 135 - 145 mmol/L   Potassium 3.8 3.5 - 5.1 mmol/L   Chloride 105 98 - 111 mmol/L   CO2 23 22 - 32 mmol/L   Glucose, Bld 149 (H) 70 - 99 mg/dL    Comment: Glucose reference range applies only to samples taken after fasting for at least 8 hours.   BUN 16 8 - 23 mg/dL   Creatinine, Ser 1.48 (H) 0.61 - 1.24 mg/dL   Calcium 8.6 (L) 8.9 - 10.3 mg/dL   GFR, Estimated 50 (L) >60 mL/min    Comment: (NOTE) Calculated using the CKD-EPI Creatinine Equation (2021)    Anion gap 8 5 - 15    Comment: Performed at [facility], [place] [address] [place], [place] [address]  CBC     Status: Abnormal   Collection Time: 10/10/21  4:38 AM  Result Value Ref Range   WBC 3.6 (L) 4.0 - 10.5 K/uL   RBC 3.02 (L) 4.22 - 5.81 MIL/uL   Hemoglobin 9.1 (L) 13.0 - 17.0 g/dL   HCT 27.6 (L) 39.0 - 52.0 %   MCV 91.4 80.0 - 100.0 fL   MCH 30.1 26.0 - 34.0 pg   MCHC 33.0 30.0 - 36.0 g/dL   RDW 13.2 11.5 - 15.5 %   Platelets 214 150 - 400 K/uL   nRBC 0.0 0.0 - 0.2 %    Comment: Performed at [facility], [place] [address], [place], [place] [address]  Magnesium     Status: None   Collection Time: 10/10/21  4:38 AM  Result Value Ref Range   Magnesium 2.0 1.7 - 2.4 mg/dL    Comment: Performed at [facility], [place] [address], [place], [place] [address]  Glucose, capillary     Status: Abnormal   Collection Time: 10/10/21  7:29 AM  Result Value Ref Range   Glucose-Capillary 173 (H) 70 - 99 mg/dL    Comment: Glucose reference range applies only to samples taken after fasting for at least 8 hours.  Glucose, capillary     Status: Abnormal   Collection Time: 10/10/21  8:18 AM  Result Value Ref Range   Glucose-Capillary 172 (H) 70 - 99 mg/dL    Comment: Glucose reference range applies only to samples taken after fasting for at least 8 hours.   Surgical pathology     Status: None   Collection Time: 10/10/21  9:22 AM  Result Value Ref Range   SURGICAL PATHOLOGY      SURGICAL PATHOLOGY CASE: [id number] PATIENT: Olin Bettes Surgical Pathology Report     Clinical History: Iron deficiency anemia, weakness, fatigue; r/o H pylori (jmc)     FINAL MICROSCOPIC DIAGNOSIS:  A. GASTRIC, BIOPSY: - Gastric antral mucosa showing nonspecific reactive gastropathy with chronic gastritis and intestinal metaplasia - Negative for dysplasia - Warthin Starry stain is negative for Helicobacter pylori      GROSS DESCRIPTION:  Received in formalin are tan, soft tissue fragments that are submitted in toto. Number: 6 size: 0.2-0.6 cm blocks: 1 (GRP 10/10/2021)    Final Diagnosis performed by Jaquita Folds, MD.   Electronically signed 10/12/2021 Technical and / or Professional components performed at  [facility], [place] [address], [place], [place] [address].  Immunohistochemistry Technical component (if applicable) was performed at [facility]. [address], [place], [place] [address]   IMMUNOHISTOCHEMISTRY DISCLAIMER (if applicable): Some of these immunohistochemical stains may have been developed and the performance characteristics determine by [facility]. Some may not have been cleared or approved by the U.S. Food and Drug Administration. The FDA has determined that such clearance or approval is not necessary. This test is used for clinical purposes. It should not be regarded as investigational or for research. This laboratory is certified under the [place] (CLIA-88) as qualified to perform high complexity clinical laboratory testing.  The controls stained appropriately.   Glucose, capillary     Status: None   Collection Time: 10/10/21 11:20 AM  Result Value Ref Range   Glucose-Capillary 79 70 - 99 mg/dL     Comment: Glucose reference range applies only to samples taken after fasting for at least 8 hours.  ECHOCARDIOGRAM COMPLETE     Status: None   Collection Time: 10/10/21  3:35 PM  Result Value Ref Range   Weight 2,921.6 oz   Height 69 in   BP 150/62 mmHg   Single Plane A2C EF 49.1 %   Single Plane A4C EF 50.7 %   Calc EF 48.3 %   S' Lateral 3.80 cm   Area-P 1/2 3.34 cm2  Glucose, capillary     Status: Abnormal   Collection Time: 10/10/21  4:20 PM  Result Value Ref Range   Glucose-Capillary 160 (H) 70 - 99 mg/dL    Comment: Glucose reference range applies only to samples taken after fasting for at least 8 hours.  Basic metabolic panel     Status: Abnormal   Collection Time: 11/09/21  9:14 AM  Result Value Ref Range   Glucose 95 70 - 99 mg/dL   BUN 19 8 - 27 mg/dL   Creatinine, Ser 1.73 (H) 0.76 - 1.27 mg/dL   eGFR 41 (L) >59 mL/min/1.73   BUN/Creatinine Ratio 11 10 - 24   Sodium 133 (L) 134 - 144 mmol/L   Potassium 4.3 3.5 - 5.2 mmol/L   Chloride 97 96 - 106 mmol/L   CO2 23 20 - 29 mmol/L   Calcium 8.4 (L) 8.6 - 10.2 mg/dL  CBC with Differential/Platelet     Status: Abnormal   Collection Time: 11/09/21  9:14 AM  Result Value Ref Range   WBC 2.8 (L) 3.4 - 10.8 x10E3/uL   RBC 3.54 (L) 4.14 - 5.80 x10E6/uL   Hemoglobin 10.4 (L) 13.0 - 17.7 g/dL   Hematocrit 31.2 (L) 37.5 - 51.0 %   MCV 88 79 - 97 fL   MCH 29.4 26.6 - 33.0 pg   MCHC 33.3 31.5 - 35.7 g/dL   RDW 12.5 11.6 - 15.4 %   Platelets 130 (L) 150 - 450 x10E3/uL   Neutrophils 68 Not Estab. %   Lymphs 17 Not Estab. %   Monocytes 11 Not Estab. %   Eos 2 Not Estab. %   Basos 1 Not Estab. %   Neutrophils Absolute 1.9 1.4 - 7.0 x10E3/uL   Lymphocytes Absolute 0.5 (L) 0.7 - 3.1 x10E3/uL   Monocytes Absolute 0.3 0.1 - 0.9 x10E3/uL   EOS (ABSOLUTE) 0.1 0.0 - 0.4 x10E3/uL   Basophils Absolute 0.0 0.0 - 0.2 x10E3/uL   Immature Granulocytes 1 Not Estab. %   Immature Grans (Abs) 0.0  0.0 - 0.1 x10E3/uL  Comprehensive metabolic  panel     Status: Abnormal   Collection Time: 11/23/21 12:51 AM  Result Value Ref Range   Sodium 130 (L) 135 - 145 mmol/L   Potassium 3.5 3.5 - 5.1 mmol/L   Chloride 101 98 - 111 mmol/L   CO2 22 22 - 32 mmol/L   Glucose, Bld 105 (H) 70 - 99 mg/dL    Comment: Glucose reference range applies only to samples taken after fasting for at least 8 hours.   BUN 19 8 - 23 mg/dL   Creatinine, Ser 1.56 (H) 0.61 - 1.24 mg/dL   Calcium 7.6 (L) 8.9 - 10.3 mg/dL   Total Protein 6.5 6.5 - 8.1 g/dL   Albumin 2.4 (L) 3.5 - 5.0 g/dL   AST 25 15 - 41 U/L   ALT 16 0 - 44 U/L   Alkaline Phosphatase 59 38 - 126 U/L   Total Bilirubin 0.8 0.3 - 1.2 mg/dL   GFR, Estimated 47 (L) >60 mL/min    Comment: (NOTE) Calculated using the CKD-EPI Creatinine Equation (2021)    Anion gap 7 5 - 15    Comment: Performed at [place] [address], [place], [place] [address]  CBC WITH DIFFERENTIAL     Status: Abnormal   Collection Time: 11/23/21 12:51 AM  Result Value Ref Range   WBC 1.8 (L) 4.0 - 10.5 K/uL   RBC 2.86 (L) 4.22 - 5.81 MIL/uL   Hemoglobin 8.4 (L) 13.0 - 17.0 g/dL   HCT 25.7 (L) 39.0 - 52.0 %   MCV 89.9 80.0 - 100.0 fL   MCH 29.4 26.0 - 34.0 pg   MCHC 32.7 30.0 - 36.0 g/dL   RDW 14.0 11.5 - 15.5 %   Platelets 203 150 - 400 K/uL   nRBC 0.0 0.0 - 0.2 %   Neutrophils Relative % 63 %   Neutro Abs 1.2 (L) 1.7 - 7.7 K/uL   Lymphocytes Relative 22 %   Lymphs Abs 0.4 (L) 0.7 - 4.0 K/uL   Monocytes Relative 11 %   Monocytes Absolute 0.2 0.1 - 1.0 K/uL   Eosinophils Relative 2 %   Eosinophils Absolute 0.0 0.0 - 0.5 K/uL   Basophils Relative 1 %   Basophils Absolute 0.0 0.0 - 0.1 K/uL   Immature Granulocytes 1 %   Abs Immature Granulocytes 0.01 0.00 - 0.07 K/uL    Comment: Performed at [facility], [address], [place], [place] [address]  Protime-INR     Status: Abnormal   Collection Time: 11/23/21 12:51 AM  Result Value Ref Range   Prothrombin Time 35.5 (H) 11.4 - 15.2 seconds    INR 3.5 (H) 0.8 - 1.2    Comment: (NOTE) INR goal varies based on device and disease states. Performed at [facility], [place] [address] [place], [place] [address]   APTT     Status: Abnormal   Collection Time: 11/23/21 12:51 AM  Result Value Ref Range   aPTT 83 (H) 24 - 36 seconds    Comment:        IF BASELINE aPTT IS ELEVATED, SUGGEST PATIENT RISK ASSESSMENT BE USED TO DETERMINE APPROPRIATE ANTICOAGULANT THERAPY. Performed at [facility], [place] [address], [place], [place] [address]   Blood Culture (routine x 2)     Status: None   Collection Time: 11/23/21 12:51 AM   Specimen: BLOOD  Result Value Ref Range   Specimen Description  BLOOD SITE NOT SPECIFIED    Special Requests      BOTTLES DRAWN AEROBIC AND ANAEROBIC Blood Culture adequate volume   Culture      NO GROWTH 5 DAYS Performed at [facility], [place] [address], [place], [place] [address]    Report Status 11/28/2021 FINAL   Urinalysis, Routine w reflex microscopic In/Out Cath Urine     Status: Abnormal   Collection Time: 11/23/21 12:51 AM  Result Value Ref Range   Color, Urine YELLOW YELLOW   APPearance CLEAR CLEAR   Specific Gravity, Urine 1.011 1.005 - 1.030   pH 5.0 5.0 - 8.0   Glucose, UA NEGATIVE NEGATIVE mg/dL   Hgb urine dipstick SMALL (A) NEGATIVE   Bilirubin Urine NEGATIVE NEGATIVE   Ketones, ur NEGATIVE NEGATIVE mg/dL   Protein, ur 30 (A) NEGATIVE mg/dL   Nitrite NEGATIVE NEGATIVE   Leukocytes,Ua NEGATIVE NEGATIVE   RBC / HPF 6-10 0 - 5 RBC/hpf   WBC, UA 0-5 0 - 5 WBC/hpf   Bacteria, UA NONE SEEN NONE SEEN   Squamous Epithelial / LPF 0-5 0 - 5    Comment: Performed at [facility], [place] [address] [place], [place] [address]  Urine Culture     Status: Abnormal   Collection Time: 11/23/21 12:51 AM   Specimen: In/Out Cath Urine  Result Value Ref Range   Specimen Description IN/OUT CATH URINE    Special Requests      NONE Performed at [facility], [place] [address]  [place], [place] [address]    Culture 200 COLONIES/mL ENTEROCOCCUS FAECALIS (A)    Report Status 11/26/2021 FINAL    Organism ID, Bacteria ENTEROCOCCUS FAECALIS (A)       Susceptibility   Enterococcus faecalis - MIC*    AMPICILLIN <=2 SENSITIVE Sensitive     NITROFURANTOIN <=16 SENSITIVE Sensitive     VANCOMYCIN 2 SENSITIVE Sensitive     * 200 COLONIES/mL ENTEROCOCCUS FAECALIS  Fibrinogen     Status: None   Collection Time: 11/23/21 12:51 AM  Result Value Ref Range   Fibrinogen 429 210 - 475 mg/dL    Comment: (NOTE) Fibrinogen results may be underestimated in patients receiving thrombolytic therapy. Performed at [facility], [place] [address] [place], [place] [address]   Blood Culture (routine x 2)     Status: None   Collection Time: 11/23/21 12:56 AM   Specimen: BLOOD LEFT FOREARM  Result Value Ref Range   Specimen Description BLOOD LEFT FOREARM    Special Requests      BOTTLES DRAWN AEROBIC AND ANAEROBIC Blood Culture adequate volume   Culture      NO GROWTH 5 DAYS Performed at [facility], [place] [address], [place], [place] [address]    Report Status 11/28/2021 FINAL   Lactic acid, plasma     Status: None   Collection Time: 11/23/21  1:55 AM  Result Value Ref Range   Lactic Acid, Venous 0.8 0.5 - 1.9 mmol/L    Comment: Performed at [place] [address] [place], [place] [address]  Lactic acid, plasma     Status: None   Collection Time: 11/23/21  2:43 AM  Result Value Ref Range   Lactic Acid, Venous 0.8 0.5 - 1.9 mmol/L    Comment: Performed at [place] [address] [place],  [address]  POC occult blood, ED     Status: None   Collection Time:  11/23/21  4:09 AM  Result Value Ref Range   Fecal Occult Bld NEGATIVE NEGATIVE  Resp Panel by RT-PCR (Flu A&B, Covid) Nasopharyngeal Swab     Status: None   Collection Time: 11/23/21  7:47 AM   Specimen: Nasopharyngeal Swab; Nasopharyngeal(NP) swabs in vial transport medium  Result Value  Ref Range   SARS Coronavirus 2 by RT PCR NEGATIVE NEGATIVE    Comment: (NOTE) SARS-CoV-2 target nucleic acids are NOT DETECTED.  The SARS-CoV-2 RNA is generally detectable in upper respiratory specimens during the acute phase of infection. The lowest concentration of SARS-CoV-2 viral copies this assay can detect is 138 copies/mL. A negative result does not preclude SARS-Cov-2 infection and should not be used as the sole basis for treatment or other patient management decisions. A negative result may occur with  improper specimen collection/handling, submission of specimen other than nasopharyngeal swab, presence of viral mutation(s) within the areas targeted by this assay, and inadequate number of viral copies(<138 copies/mL). A negative result must be combined with clinical observations, patient history, and epidemiological information. The expected result is Negative.  Fact Sheet for Patients:  EntrepreneurPulse.com.au  Fact Sheet for Healthcare Providers:  IncredibleEmployment.be  This test is no t yet approved or cleared by the [place] FDA and  has been authorized for detection and/or diagnosis of SARS-CoV-2 by FDA under an Emergency Use Authorization (EUA). This EUA will remain  in effect (meaning this test can be used) for the duration of the COVID-19 declaration under Section 564(b)(1) of the Act, 21 U.S.C.section 360bbb-3(b)(1), unless the authorization is terminated  or revoked sooner.       Influenza A by PCR NEGATIVE NEGATIVE   Influenza B by PCR NEGATIVE NEGATIVE    Comment: (NOTE) The Xpert Xpress SARS-CoV-2/FLU/RSV plus assay is intended as an aid in the diagnosis of influenza from Nasopharyngeal swab specimens and should not be used as a sole basis for treatment. Nasal washings and aspirates are unacceptable for Xpert Xpress SARS-CoV-2/FLU/RSV testing.  Fact Sheet for  Patients: EntrepreneurPulse.com.au  Fact Sheet for Healthcare Providers: IncredibleEmployment.be  This test is not yet approved or cleared by the [place] FDA and has been authorized for detection and/or diagnosis of SARS-CoV-2 by FDA under an Emergency Use Authorization (EUA). This EUA will remain in effect (meaning this test can be used) for the duration of the COVID-19 declaration under Section 564(b)(1) of the Act, 21 U.S.C. section 360bbb-3(b)(1), unless the authorization is terminated or revoked.  Performed at [facility], [place] [address] [place], [place] [address]   Respiratory (~20 pathogens) panel by PCR     Status: None   Collection Time: 11/23/21  7:47 AM   Specimen: Nasopharyngeal Swab; Respiratory  Result Value Ref Range   Adenovirus NOT DETECTED NOT DETECTED   Coronavirus 229E NOT DETECTED NOT DETECTED    Comment: (NOTE) The Coronavirus on the Respiratory Panel, DOES NOT test for the novel  Coronavirus (2019 nCoV)    Coronavirus HKU1 NOT DETECTED NOT DETECTED   Coronavirus NL63 NOT DETECTED NOT DETECTED   Coronavirus OC43 NOT DETECTED NOT DETECTED   Metapneumovirus NOT DETECTED NOT DETECTED   Rhinovirus / Enterovirus NOT DETECTED NOT DETECTED   Influenza A NOT DETECTED NOT DETECTED   Influenza B NOT DETECTED NOT DETECTED   Parainfluenza Virus 1 NOT DETECTED NOT DETECTED   Parainfluenza Virus 2 NOT DETECTED NOT DETECTED   Parainfluenza Virus 3 NOT DETECTED NOT DETECTED   Parainfluenza Virus 4 NOT DETECTED NOT DETECTED  Respiratory Syncytial Virus NOT DETECTED NOT DETECTED   Bordetella pertussis NOT DETECTED NOT DETECTED   Bordetella Parapertussis NOT DETECTED NOT DETECTED   Chlamydophila pneumoniae NOT DETECTED NOT DETECTED   Mycoplasma pneumoniae NOT DETECTED NOT DETECTED    Comment: Performed at [facility] [address] [place], [place] [address]  CBG monitoring, ED     Status: Abnormal   Collection  Time: 11/23/21 11:58 AM  Result Value Ref Range   Glucose-Capillary 46 (L) 70 - 99 mg/dL    Comment: Glucose reference range applies only to samples taken after fasting for at least 8 hours.  CBG monitoring, ED     Status: None   Collection Time: 11/23/21 12:33 PM  Result Value Ref Range   Glucose-Capillary 82 70 - 99 mg/dL    Comment: Glucose reference range applies only to samples taken after fasting for at least 8 hours.  CBG monitoring, ED     Status: Abnormal   Collection Time: 11/23/21  4:37 PM  Result Value Ref Range   Glucose-Capillary 121 (H) 70 - 99 mg/dL    Comment: Glucose reference range applies only to samples taken after fasting for at least 8 hours.  CBG monitoring, ED     Status: Abnormal   Collection Time: 11/23/21  5:30 PM  Result Value Ref Range   Glucose-Capillary 115 (H) 70 - 99 mg/dL    Comment: Glucose reference range applies only to samples taken after fasting for at least 8 hours.  Glucose, capillary     Status: Abnormal   Collection Time: 11/23/21  8:29 PM  Result Value Ref Range   Glucose-Capillary 133 (H) 70 - 99 mg/dL    Comment: Glucose reference range applies only to samples taken after fasting for at least 8 hours.  Glucose, capillary     Status: None   Collection Time: 11/24/21 12:11 AM  Result Value Ref Range   Glucose-Capillary 92 70 - 99 mg/dL    Comment: Glucose reference range applies only to samples taken after fasting for at least 8 hours.  Vitamin B12     Status: None   Collection Time: 11/24/21  1:02 AM  Result Value Ref Range   Vitamin B-12 306 180 - 914 pg/mL    Comment: (NOTE) This assay is not validated for testing neonatal or myeloproliferative syndrome specimens for Vitamin B12 levels. Performed at [facility], [place] [address], [place], [place] [address]   Folate     Status: None   Collection Time: 11/24/21  1:02 AM  Result Value Ref Range   Folate 15.6 >5.9 ng/mL    Comment: Performed at [facility] [address] [place], [place] [address]  Iron and TIBC     Status: Abnormal   Collection Time: 11/24/21  1:02 AM  Result Value Ref Range   Iron 32 (L) 45 - 182 ug/dL   TIBC 153 (L) 250 - 450 ug/dL   Saturation Ratios 21 17.9 - 39.5 %   UIBC 121 ug/dL    Comment: Performed at [facility], [place] [address], [place], [place] [address]  Ferritin     Status: None   Collection Time: 11/24/21  1:02 AM  Result Value Ref Range   Ferritin 94 24 - 336 ng/mL    Comment: Performed at [facility], [place] [address] [place], [place] [address]  Reticulocytes     Status: Abnormal   Collection Time: 11/24/21  1:02  AM  Result Value Ref Range   Retic Ct Pct 2.4 0.4 - 3.1 %   RBC. 2.62 (L) 4.22 - 5.81 MIL/uL   Retic Count, Absolute 61.8 19.0 - 186.0 K/uL   Immature Retic Fract 22.2 (H) 2.3 - 15.9 %    Comment: Performed at [facility] [address], [place], [place] [address]  CBC     Status: Abnormal   Collection Time: 11/24/21  1:02 AM  Result Value Ref Range   WBC 1.5 (L) 4.0 - 10.5 K/uL   RBC 2.64 (L) 4.22 - 5.81 MIL/uL   Hemoglobin 7.8 (L) 13.0 - 17.0 g/dL   HCT 23.4 (L) 39.0 - 52.0 %   MCV 88.6 80.0 - 100.0 fL   MCH 29.5 26.0 - 34.0 pg   MCHC 33.3 30.0 - 36.0 g/dL   RDW 14.0 11.5 - 15.5 %   Platelets 187 150 - 400 K/uL   nRBC 0.0 0.0 - 0.2 %    Comment: Performed at [facility], [place] [address] [place], [place] [address]  Basic metabolic panel     Status: Abnormal   Collection Time: 11/24/21  1:02 AM  Result Value Ref Range   Sodium 135 135 - 145 mmol/L   Potassium 3.8 3.5 - 5.1 mmol/L   Chloride 107 98 - 111 mmol/L   CO2 22 22 - 32 mmol/L   Glucose, Bld 80 70 - 99 mg/dL    Comment: Glucose reference range applies only to samples taken after fasting for at least 8 hours.   BUN 17 8 - 23 mg/dL   Creatinine, Ser 1.38 (H) 0.61 - 1.24 mg/dL   Calcium 7.9 (L) 8.9 - 10.3 mg/dL   GFR, Estimated 54 (L) >60 mL/min    Comment: (NOTE) Calculated using the CKD-EPI Creatinine  Equation (2021)    Anion gap 6 5 - 15    Comment: Performed at [facility] [address], [place], [place] [address]  Glucose, capillary     Status: Abnormal   Collection Time: 11/24/21  2:43 AM  Result Value Ref Range   Glucose-Capillary 106 (H) 70 - 99 mg/dL    Comment: Glucose reference range applies only to samples taken after fasting for at least 8 hours.  Glucose, capillary     Status: Abnormal   Collection Time: 11/24/21  4:44 AM  Result Value Ref Range   Glucose-Capillary 126 (H) 70 - 99 mg/dL    Comment: Glucose reference range applies only to samples taken after fasting for at least 8 hours.  Glucose, capillary     Status: Abnormal   Collection Time: 11/24/21  8:11 AM  Result Value Ref Range   Glucose-Capillary 140 (H) 70 - 99 mg/dL    Comment: Glucose reference range applies only to samples taken after fasting for at least 8 hours.  Glucose, capillary     Status: Abnormal   Collection Time: 11/24/21 11:35 AM  Result Value Ref Range   Glucose-Capillary 273 (H) 70 - 99 mg/dL    Comment: Glucose reference range applies only to samples taken after fasting for at least 8 hours.  Protime-INR     Status: Abnormal   Collection Time: 11/24/21  1:38 PM  Result Value Ref Range   Prothrombin Time 18.1 (H) 11.4 - 15.2 seconds   INR 1.5 (H) 0.8 - 1.2    Comment: (NOTE) INR goal varies based on device and disease states. Performed at [facility],  [address], [place], [place] [address]   Glucose, capillary     Status: Abnormal   Collection Time: 11/24/21  4:30 PM  Result Value Ref Range   Glucose-Capillary 259 (H) 70 - 99 mg/dL    Comment: Glucose reference range applies only to samples taken after fasting for at least 8 hours.  Glucose, capillary     Status: Abnormal   Collection Time: 11/24/21  8:57 PM  Result Value Ref Range   Glucose-Capillary 196 (H) 70 - 99 mg/dL    Comment: Glucose reference range applies only to samples taken after fasting for at  least 8 hours.  Glucose, capillary     Status: Abnormal   Collection Time: 11/25/21  7:41 AM  Result Value Ref Range   Glucose-Capillary 158 (H) 70 - 99 mg/dL    Comment: Glucose reference range applies only to samples taken after fasting for at least 8 hours.  CBC     Status: Abnormal   Collection Time: 11/25/21  7:48 AM  Result Value Ref Range   WBC 2.0 (L) 4.0 - 10.5 K/uL   RBC 2.81 (L) 4.22 - 5.81 MIL/uL   Hemoglobin 8.3 (L) 13.0 - 17.0 g/dL   HCT 24.7 (L) 39.0 - 52.0 %   MCV 87.9 80.0 - 100.0 fL   MCH 29.5 26.0 - 34.0 pg   MCHC 33.6 30.0 - 36.0 g/dL   RDW 13.9 11.5 - 15.5 %   Platelets 200 150 - 400 K/uL   nRBC 0.0 0.0 - 0.2 %    Comment: Performed at [facility], [place] [address] [place], [place] [address]  Glucose, capillary     Status: Abnormal   Collection Time: 11/25/21 11:40 AM  Result Value Ref Range   Glucose-Capillary 118 (H) 70 - 99 mg/dL    Comment: Glucose reference range applies only to samples taken after fasting for at least 8 hours.  Glucose, capillary     Status: Abnormal   Collection Time: 11/25/21  5:18 PM  Result Value Ref Range   Glucose-Capillary 159 (H) 70 - 99 mg/dL    Comment: Glucose reference range applies only to samples taken after fasting for at least 8 hours.  Glucose, capillary     Status: Abnormal   Collection Time: 11/25/21  8:53 PM  Result Value Ref Range   Glucose-Capillary 169 (H) 70 - 99 mg/dL    Comment: Glucose reference range applies only to samples taken after fasting for at least 8 hours.  CBC     Status: Abnormal   Collection Time: 11/26/21  1:53 AM  Result Value Ref Range   WBC 2.0 (L) 4.0 - 10.5 K/uL   RBC 3.05 (L) 4.22 - 5.81 MIL/uL   Hemoglobin 9.0 (L) 13.0 - 17.0 g/dL   HCT 27.7 (L) 39.0 - 52.0 %   MCV 90.8 80.0 - 100.0 fL   MCH 29.5 26.0 - 34.0 pg   MCHC 32.5 30.0 - 36.0 g/dL   RDW 13.9 11.5 - 15.5 %   Platelets 195 150 - 400 K/uL   nRBC 0.0 0.0 - 0.2 %    Comment: Performed at [facility], [place] [address] [place], [place] [address]  Basic metabolic panel     Status: Abnormal   Collection Time: 11/26/21  1:53 AM  Result Value Ref Range   Sodium 141 135 - 145 mmol/L   Potassium 4.8 3.5 - 5.1 mmol/L   Chloride 108 98 - 111 mmol/L  CO2 22 22 - 32 mmol/L   Glucose, Bld 219 (H) 70 - 99 mg/dL    Comment: Glucose reference range applies only to samples taken after fasting for at least 8 hours.   BUN 20 8 - 23 mg/dL   Creatinine, Ser 1.60 (H) 0.61 - 1.24 mg/dL   Calcium 8.7 (L) 8.9 - 10.3 mg/dL   GFR, Estimated 45 (L) >60 mL/min    Comment: (NOTE) Calculated using the CKD-EPI Creatinine Equation (2021)    Anion gap 11 5 - 15    Comment: Performed at [facility] [address] [place], [place] [address]  Glucose, capillary     Status: Abnormal   Collection Time: 11/26/21  4:46 AM  Result Value Ref Range   Glucose-Capillary 197 (H) 70 - 99 mg/dL    Comment: Glucose reference range applies only to samples taken after fasting for at least 8 hours.  Glucose, capillary     Status: Abnormal   Collection Time: 11/26/21  8:51 AM  Result Value Ref Range   Glucose-Capillary 196 (H) 70 - 99 mg/dL    Comment: Glucose reference range applies only to samples taken after fasting for at least 8 hours.  Surgical pathology     Status: None   Collection Time: 11/26/21 10:03 AM  Result Value Ref Range   SURGICAL PATHOLOGY      SURGICAL PATHOLOGY CASE: [id number] PATIENT: Mcgwire Puga Surgical Pathology Report     Clinical History: IDA (cm)   FINAL MICROSCOPIC DIAGNOSIS:  A. COLON, ASCENDING, POLYPECTOMY: -  Multiple fragments of tubulovillous adenoma -  No high grade-dysplasia or malignancy identified -  See comment  B. COLON, TRANSVERSE, POLYPECTOMY: -  Multiple fragments of tubular adenoma(s) -  No high-grade dysplasia or malignancy identified  -  See comment  COMMENT:  A and B.  Dr. Vic Ripper reviewed the case and agrees with the above diagnosis.  GROSS  DESCRIPTION:  A: Received in formalin are irregular to polypoid portions of tan mucosa measuring 3 x 3 x 0.6 cm in aggregate.  The specimen is entirely submitted in 4 cassettes.  B: Received in formalin are irregular to polypoid portions of tan mucosa measuring 2 x 1.8 x 0.3 cm in aggregate.  The specimen is entirely submitted in 1 cassette.  [facility] 11/26/2021)   Final Diagnosis performed by Thressa Sheller, MD.   Electro nically signed 11/27/2021 Technical and / or Professional components performed at [facility]. [facility], [place] [address] [place], [place] [address].  Immunohistochemistry Technical component (if applicable) was performed at [facility]. [address], [place], [place], [place] [address].   IMMUNOHISTOCHEMISTRY DISCLAIMER (if applicable): Some of these immunohistochemical stains may have been developed and the performance characteristics determine by [facility]. Some may not have been cleared or approved by the U.S. Food and Drug Administration. The FDA has determined that such clearance or approval is not necessary. This test is used for clinical purposes. It should not be regarded as investigational or for research. This laboratory is certified under the [place] (CLIA-88) as qualified to perform high complexity clinical laboratory testing.  The controls stained appropriately.   Glucose, capillary     Status: Abnormal   Collection Time: 11/26/21 10:41 AM  Result Value Ref Range   Glucose-Capillary 205 (H) 70 - 99 mg/dL    Comment: Glucose reference range applies only to samples taken after fasting for at least 8 hours.  Glucose,  capillary     Status: Abnormal   Collection Time: 11/26/21 12:27 PM  Result Value Ref Range   Glucose-Capillary 232 (H) 70 - 99 mg/dL    Comment: Glucose reference range applies only to samples taken after fasting for at least 8 hours.  Glucose, capillary      Status: Abnormal   Collection Time: 11/26/21  5:00 PM  Result Value Ref Range   Glucose-Capillary 294 (H) 70 - 99 mg/dL    Comment: Glucose reference range applies only to samples taken after fasting for at least 8 hours.  Glucose, capillary     Status: Abnormal   Collection Time: 11/26/21  8:42 PM  Result Value Ref Range   Glucose-Capillary 266 (H) 70 - 99 mg/dL    Comment: Glucose reference range applies only to samples taken after fasting for at least 8 hours.  Glucose, capillary     Status: Abnormal   Collection Time: 11/27/21  7:53 AM  Result Value Ref Range   Glucose-Capillary 214 (H) 70 - 99 mg/dL    Comment: Glucose reference range applies only to samples taken after fasting for at least 8 hours.  CBC     Status: Abnormal   Collection Time: 11/27/21  7:55 AM  Result Value Ref Range   WBC 2.5 (L) 4.0 - 10.5 K/uL   RBC 2.43 (L) 4.22 - 5.81 MIL/uL   Hemoglobin 7.1 (L) 13.0 - 17.0 g/dL   HCT 21.9 (L) 39.0 - 52.0 %   MCV 90.1 80.0 - 100.0 fL   MCH 29.2 26.0 - 34.0 pg   MCHC 32.4 30.0 - 36.0 g/dL   RDW 13.9 11.5 - 15.5 %   Platelets 142 (L) 150 - 400 K/uL   nRBC 0.0 0.0 - 0.2 %    Comment: Performed at [facility], [place] [address] [place], [place] [address]  CBC     Status: Abnormal   Collection Time: 11/27/21 10:06 AM  Result Value Ref Range   WBC 3.2 (L) 4.0 - 10.5 K/uL   RBC 2.62 (L) 4.22 - 5.81 MIL/uL   Hemoglobin 7.6 (L) 13.0 - 17.0 g/dL   HCT 23.9 (L) 39.0 - 52.0 %   MCV 91.2 80.0 - 100.0 fL   MCH 29.0 26.0 - 34.0 pg   MCHC 31.8 30.0 - 36.0 g/dL   RDW 13.9 11.5 - 15.5 %   Platelets 202 150 - 400 K/uL   nRBC 0.0 0.0 - 0.2 %    Comment: Performed at [facility], [place] [address], [place], [place] [address]  Glucose, capillary     Status: Abnormal   Collection Time: 11/27/21 11:09 AM  Result Value Ref Range   Glucose-Capillary 261 (H) 70 - 99 mg/dL    Comment: Glucose reference range applies only to samples taken after fasting for at least 8 hours.     Objective: General: Patient is awake, alert, and oriented x 3 and in no acute distress.  Integument: Skin is warm, dry and supple bilateral. Nails are tender, long, thickened and  dystrophic with subungual debris, consistent with onychomycosis, 1-5 bilateral.  Dried blood noted to the right hallux medial border.  No acute ingrowing.  No signs of infection. No open lesions or preulcerative lesions present bilateral. Remaining integument unremarkable.  Vasculature:  Dorsalis Pedis pulse 1/4 bilateral. Posterior Tibial pulse 0/4 bilateral.  Capillary fill time <3 sec 1-5 bilateral no hair growth to the level of the digits. Temperature gradient within normal limits.  No varicosities present bilateral.  Trace edema present bilateral.   Neurology: The patient has absent protective sensation measured with a 5.07/10g Semmes Weinstein Monofilament at all pedal sites bilateral .   Musculoskeletal: Asymptomatic hammertoe pedal deformities noted bilateral. Muscular strength 5/5 in all lower extremity muscular groups bilateral without pain on range of motion . No tenderness with calf compression bilateral.  Assessment and Plan: Problem List Items Addressed This Visit   None Visit Diagnoses     Pain due to onychomycosis of toenails of both feet    -  Primary   Diabetic neuropathy with neurologic complication (HCC)       PVD (peripheral vascular disease) (HCC)       Relevant Medications   amiodarone (PACERONE) 200 MG tablet   amLODipine (NORVASC) 5 MG tablet       -Examined patient. -Discussed and educated patient on diabetic foot care, especially with  regards to the vascular, neurological and musculoskeletal systems.  -Stressed the importance of good glycemic control and the detriment of not  controlling glucose levels in relation to the foot. -Mechanically debrided all nails 1-5 bilateral using sterile nail nipper and filed with dremel without incident  -Prescribed Corticosporin solution  for patient to use until the dried blood has resolved at the right hallux medial nail border at this time because of patient's age health history and risk for nonhealing he is not a candidate for ingrown nail procedure and we will continue with conservative care nail trimming on a routine basis -Answered all patient questions -Patient to return  in 3 months for at risk foot care -Patient advised to call the office if any problems or questions arise in the meantime.  Landis Martins, DPM

## 2021-12-01 DIAGNOSIS — I119 Hypertensive heart disease without heart failure: Secondary | ICD-10-CM | POA: Diagnosis not present

## 2021-12-01 DIAGNOSIS — D649 Anemia, unspecified: Secondary | ICD-10-CM | POA: Diagnosis not present

## 2021-12-01 DIAGNOSIS — Z7901 Long term (current) use of anticoagulants: Secondary | ICD-10-CM | POA: Diagnosis not present

## 2021-12-01 DIAGNOSIS — Z7401 Bed confinement status: Secondary | ICD-10-CM | POA: Diagnosis not present

## 2021-12-01 DIAGNOSIS — I361 Nonrheumatic tricuspid (valve) insufficiency: Secondary | ICD-10-CM | POA: Diagnosis not present

## 2021-12-01 DIAGNOSIS — I4811 Longstanding persistent atrial fibrillation: Secondary | ICD-10-CM | POA: Diagnosis not present

## 2021-12-01 DIAGNOSIS — N179 Acute kidney failure, unspecified: Secondary | ICD-10-CM | POA: Diagnosis not present

## 2021-12-01 DIAGNOSIS — I444 Left anterior fascicular block: Secondary | ICD-10-CM | POA: Diagnosis not present

## 2021-12-01 DIAGNOSIS — I674 Hypertensive encephalopathy: Secondary | ICD-10-CM | POA: Diagnosis not present

## 2021-12-01 DIAGNOSIS — Z79899 Other long term (current) drug therapy: Secondary | ICD-10-CM | POA: Diagnosis not present

## 2021-12-01 DIAGNOSIS — I5033 Acute on chronic diastolic (congestive) heart failure: Secondary | ICD-10-CM | POA: Diagnosis not present

## 2021-12-01 DIAGNOSIS — E785 Hyperlipidemia, unspecified: Secondary | ICD-10-CM | POA: Diagnosis not present

## 2021-12-01 DIAGNOSIS — I161 Hypertensive emergency: Secondary | ICD-10-CM | POA: Diagnosis not present

## 2021-12-01 DIAGNOSIS — D72819 Decreased white blood cell count, unspecified: Secondary | ICD-10-CM | POA: Diagnosis not present

## 2021-12-01 DIAGNOSIS — E1165 Type 2 diabetes mellitus with hyperglycemia: Secondary | ICD-10-CM | POA: Diagnosis not present

## 2021-12-01 DIAGNOSIS — I4891 Unspecified atrial fibrillation: Secondary | ICD-10-CM | POA: Diagnosis not present

## 2021-12-01 DIAGNOSIS — A419 Sepsis, unspecified organism: Secondary | ICD-10-CM | POA: Diagnosis not present

## 2021-12-01 DIAGNOSIS — I480 Paroxysmal atrial fibrillation: Secondary | ICD-10-CM | POA: Diagnosis not present

## 2021-12-01 DIAGNOSIS — R001 Bradycardia, unspecified: Secondary | ICD-10-CM | POA: Diagnosis not present

## 2021-12-01 DIAGNOSIS — R0689 Other abnormalities of breathing: Secondary | ICD-10-CM | POA: Diagnosis not present

## 2021-12-01 DIAGNOSIS — Z7902 Long term (current) use of antithrombotics/antiplatelets: Secondary | ICD-10-CM | POA: Diagnosis not present

## 2021-12-01 DIAGNOSIS — I517 Cardiomegaly: Secondary | ICD-10-CM | POA: Diagnosis not present

## 2021-12-01 DIAGNOSIS — I110 Hypertensive heart disease with heart failure: Secondary | ICD-10-CM | POA: Diagnosis not present

## 2021-12-01 DIAGNOSIS — I222 Subsequent non-ST elevation (NSTEMI) myocardial infarction: Secondary | ICD-10-CM | POA: Diagnosis not present

## 2021-12-01 DIAGNOSIS — R0902 Hypoxemia: Secondary | ICD-10-CM | POA: Diagnosis not present

## 2021-12-01 DIAGNOSIS — E1169 Type 2 diabetes mellitus with other specified complication: Secondary | ICD-10-CM | POA: Diagnosis not present

## 2021-12-01 DIAGNOSIS — I248 Other forms of acute ischemic heart disease: Secondary | ICD-10-CM | POA: Diagnosis not present

## 2021-12-01 DIAGNOSIS — Z888 Allergy status to other drugs, medicaments and biological substances status: Secondary | ICD-10-CM | POA: Diagnosis not present

## 2021-12-01 DIAGNOSIS — Z743 Need for continuous supervision: Secondary | ICD-10-CM | POA: Diagnosis not present

## 2021-12-01 DIAGNOSIS — J9811 Atelectasis: Secondary | ICD-10-CM | POA: Diagnosis not present

## 2021-12-01 DIAGNOSIS — M6281 Muscle weakness (generalized): Secondary | ICD-10-CM | POA: Diagnosis not present

## 2021-12-01 DIAGNOSIS — R531 Weakness: Secondary | ICD-10-CM | POA: Diagnosis not present

## 2021-12-01 DIAGNOSIS — N3289 Other specified disorders of bladder: Secondary | ICD-10-CM | POA: Diagnosis not present

## 2021-12-01 DIAGNOSIS — I700 Atherosclerosis of aorta: Secondary | ICD-10-CM | POA: Diagnosis not present

## 2021-12-01 DIAGNOSIS — J189 Pneumonia, unspecified organism: Secondary | ICD-10-CM | POA: Diagnosis not present

## 2021-12-01 DIAGNOSIS — M2550 Pain in unspecified joint: Secondary | ICD-10-CM | POA: Diagnosis not present

## 2021-12-01 DIAGNOSIS — D509 Iron deficiency anemia, unspecified: Secondary | ICD-10-CM | POA: Diagnosis not present

## 2021-12-01 DIAGNOSIS — R509 Fever, unspecified: Secondary | ICD-10-CM | POA: Diagnosis not present

## 2021-12-01 DIAGNOSIS — R0603 Acute respiratory distress: Secondary | ICD-10-CM | POA: Diagnosis not present

## 2021-12-01 DIAGNOSIS — I130 Hypertensive heart and chronic kidney disease with heart failure and stage 1 through stage 4 chronic kidney disease, or unspecified chronic kidney disease: Secondary | ICD-10-CM | POA: Diagnosis not present

## 2021-12-01 DIAGNOSIS — J811 Chronic pulmonary edema: Secondary | ICD-10-CM | POA: Diagnosis not present

## 2021-12-01 DIAGNOSIS — E1122 Type 2 diabetes mellitus with diabetic chronic kidney disease: Secondary | ICD-10-CM | POA: Diagnosis not present

## 2021-12-01 DIAGNOSIS — I21A1 Myocardial infarction type 2: Secondary | ICD-10-CM | POA: Diagnosis not present

## 2021-12-01 DIAGNOSIS — N183 Chronic kidney disease, stage 3 unspecified: Secondary | ICD-10-CM | POA: Diagnosis not present

## 2021-12-01 DIAGNOSIS — R069 Unspecified abnormalities of breathing: Secondary | ICD-10-CM | POA: Diagnosis not present

## 2021-12-01 DIAGNOSIS — E039 Hypothyroidism, unspecified: Secondary | ICD-10-CM | POA: Diagnosis not present

## 2021-12-01 DIAGNOSIS — L89159 Pressure ulcer of sacral region, unspecified stage: Secondary | ICD-10-CM | POA: Diagnosis not present

## 2021-12-01 DIAGNOSIS — I447 Left bundle-branch block, unspecified: Secondary | ICD-10-CM | POA: Diagnosis not present

## 2021-12-01 DIAGNOSIS — I4729 Other ventricular tachycardia: Secondary | ICD-10-CM | POA: Diagnosis not present

## 2021-12-01 DIAGNOSIS — I472 Ventricular tachycardia, unspecified: Secondary | ICD-10-CM | POA: Diagnosis not present

## 2021-12-01 DIAGNOSIS — J9601 Acute respiratory failure with hypoxia: Secondary | ICD-10-CM | POA: Diagnosis not present

## 2021-12-02 DIAGNOSIS — I480 Paroxysmal atrial fibrillation: Secondary | ICD-10-CM | POA: Diagnosis not present

## 2021-12-02 DIAGNOSIS — I248 Other forms of acute ischemic heart disease: Secondary | ICD-10-CM | POA: Diagnosis not present

## 2021-12-02 DIAGNOSIS — I119 Hypertensive heart disease without heart failure: Secondary | ICD-10-CM | POA: Diagnosis not present

## 2021-12-02 DIAGNOSIS — Z7901 Long term (current) use of anticoagulants: Secondary | ICD-10-CM

## 2021-12-02 DIAGNOSIS — I472 Ventricular tachycardia, unspecified: Secondary | ICD-10-CM | POA: Diagnosis not present

## 2021-12-02 DIAGNOSIS — I444 Left anterior fascicular block: Secondary | ICD-10-CM | POA: Diagnosis not present

## 2021-12-03 DIAGNOSIS — I130 Hypertensive heart and chronic kidney disease with heart failure and stage 1 through stage 4 chronic kidney disease, or unspecified chronic kidney disease: Secondary | ICD-10-CM

## 2021-12-03 DIAGNOSIS — D509 Iron deficiency anemia, unspecified: Secondary | ICD-10-CM

## 2021-12-03 DIAGNOSIS — I248 Other forms of acute ischemic heart disease: Secondary | ICD-10-CM | POA: Diagnosis not present

## 2021-12-03 DIAGNOSIS — N183 Chronic kidney disease, stage 3 unspecified: Secondary | ICD-10-CM

## 2021-12-03 DIAGNOSIS — I472 Ventricular tachycardia, unspecified: Secondary | ICD-10-CM | POA: Diagnosis not present

## 2021-12-03 DIAGNOSIS — E1122 Type 2 diabetes mellitus with diabetic chronic kidney disease: Secondary | ICD-10-CM

## 2021-12-03 DIAGNOSIS — I119 Hypertensive heart disease without heart failure: Secondary | ICD-10-CM | POA: Diagnosis not present

## 2021-12-03 DIAGNOSIS — I480 Paroxysmal atrial fibrillation: Secondary | ICD-10-CM | POA: Diagnosis not present

## 2021-12-03 DIAGNOSIS — I444 Left anterior fascicular block: Secondary | ICD-10-CM | POA: Diagnosis not present

## 2021-12-04 DIAGNOSIS — I480 Paroxysmal atrial fibrillation: Secondary | ICD-10-CM | POA: Diagnosis not present

## 2021-12-04 DIAGNOSIS — I119 Hypertensive heart disease without heart failure: Secondary | ICD-10-CM | POA: Diagnosis not present

## 2021-12-04 DIAGNOSIS — I472 Ventricular tachycardia, unspecified: Secondary | ICD-10-CM | POA: Diagnosis not present

## 2021-12-04 DIAGNOSIS — I248 Other forms of acute ischemic heart disease: Secondary | ICD-10-CM | POA: Diagnosis not present

## 2021-12-04 DIAGNOSIS — R001 Bradycardia, unspecified: Secondary | ICD-10-CM | POA: Diagnosis not present

## 2021-12-05 DIAGNOSIS — R001 Bradycardia, unspecified: Secondary | ICD-10-CM | POA: Diagnosis not present

## 2021-12-06 DIAGNOSIS — I472 Ventricular tachycardia, unspecified: Secondary | ICD-10-CM | POA: Diagnosis not present

## 2021-12-06 DIAGNOSIS — I480 Paroxysmal atrial fibrillation: Secondary | ICD-10-CM | POA: Diagnosis not present

## 2021-12-06 DIAGNOSIS — I248 Other forms of acute ischemic heart disease: Secondary | ICD-10-CM | POA: Diagnosis not present

## 2021-12-06 DIAGNOSIS — I119 Hypertensive heart disease without heart failure: Secondary | ICD-10-CM | POA: Diagnosis not present

## 2021-12-06 DIAGNOSIS — I447 Left bundle-branch block, unspecified: Secondary | ICD-10-CM | POA: Diagnosis not present

## 2021-12-07 ENCOUNTER — Other Ambulatory Visit (HOSPITAL_COMMUNITY)
Admission: RE | Admit: 2021-12-07 | Discharge: 2021-12-07 | Disposition: A | Discharge status: Home / Self Care | Payer: Medicare Other | Source: Ambulatory Visit | Attending: Oncology | Admitting: Oncology

## 2021-12-07 DIAGNOSIS — I480 Paroxysmal atrial fibrillation: Secondary | ICD-10-CM | POA: Diagnosis not present

## 2021-12-07 DIAGNOSIS — D649 Anemia, unspecified: Secondary | ICD-10-CM | POA: Insufficient documentation

## 2021-12-07 DIAGNOSIS — I248 Other forms of acute ischemic heart disease: Secondary | ICD-10-CM | POA: Diagnosis not present

## 2021-12-07 DIAGNOSIS — D509 Iron deficiency anemia, unspecified: Secondary | ICD-10-CM | POA: Diagnosis not present

## 2021-12-07 DIAGNOSIS — I447 Left bundle-branch block, unspecified: Secondary | ICD-10-CM | POA: Diagnosis not present

## 2021-12-07 DIAGNOSIS — D72819 Decreased white blood cell count, unspecified: Secondary | ICD-10-CM | POA: Diagnosis not present

## 2021-12-07 DIAGNOSIS — I472 Ventricular tachycardia, unspecified: Secondary | ICD-10-CM | POA: Diagnosis not present

## 2021-12-07 DIAGNOSIS — I119 Hypertensive heart disease without heart failure: Secondary | ICD-10-CM | POA: Diagnosis not present

## 2021-12-08 DIAGNOSIS — A419 Sepsis, unspecified organism: Secondary | ICD-10-CM | POA: Diagnosis not present

## 2021-12-08 DIAGNOSIS — Z7901 Long term (current) use of anticoagulants: Secondary | ICD-10-CM | POA: Diagnosis not present

## 2021-12-08 DIAGNOSIS — E1165 Type 2 diabetes mellitus with hyperglycemia: Secondary | ICD-10-CM

## 2021-12-08 DIAGNOSIS — I447 Left bundle-branch block, unspecified: Secondary | ICD-10-CM | POA: Diagnosis not present

## 2021-12-08 DIAGNOSIS — I4729 Other ventricular tachycardia: Secondary | ICD-10-CM | POA: Diagnosis not present

## 2021-12-08 DIAGNOSIS — I480 Paroxysmal atrial fibrillation: Secondary | ICD-10-CM | POA: Diagnosis not present

## 2021-12-09 DIAGNOSIS — Z7901 Long term (current) use of anticoagulants: Secondary | ICD-10-CM | POA: Diagnosis not present

## 2021-12-09 DIAGNOSIS — I4729 Other ventricular tachycardia: Secondary | ICD-10-CM | POA: Diagnosis not present

## 2021-12-09 DIAGNOSIS — A419 Sepsis, unspecified organism: Secondary | ICD-10-CM | POA: Diagnosis not present

## 2021-12-09 DIAGNOSIS — I480 Paroxysmal atrial fibrillation: Secondary | ICD-10-CM | POA: Diagnosis not present

## 2021-12-10 DIAGNOSIS — A419 Sepsis, unspecified organism: Secondary | ICD-10-CM | POA: Diagnosis not present

## 2021-12-10 DIAGNOSIS — Z7901 Long term (current) use of anticoagulants: Secondary | ICD-10-CM | POA: Diagnosis not present

## 2021-12-10 DIAGNOSIS — I4729 Other ventricular tachycardia: Secondary | ICD-10-CM | POA: Diagnosis not present

## 2021-12-10 DIAGNOSIS — I480 Paroxysmal atrial fibrillation: Secondary | ICD-10-CM | POA: Diagnosis not present

## 2021-12-11 DIAGNOSIS — I480 Paroxysmal atrial fibrillation: Secondary | ICD-10-CM | POA: Diagnosis not present

## 2021-12-11 DIAGNOSIS — A419 Sepsis, unspecified organism: Secondary | ICD-10-CM | POA: Diagnosis not present

## 2021-12-11 DIAGNOSIS — I4729 Other ventricular tachycardia: Secondary | ICD-10-CM | POA: Diagnosis not present

## 2021-12-11 DIAGNOSIS — Z7901 Long term (current) use of anticoagulants: Secondary | ICD-10-CM | POA: Diagnosis not present

## 2021-12-12 DIAGNOSIS — L89313 Pressure ulcer of right buttock, stage 3: Secondary | ICD-10-CM | POA: Diagnosis not present

## 2021-12-12 DIAGNOSIS — I5033 Acute on chronic diastolic (congestive) heart failure: Secondary | ICD-10-CM | POA: Diagnosis not present

## 2021-12-12 DIAGNOSIS — D649 Anemia, unspecified: Secondary | ICD-10-CM | POA: Diagnosis not present

## 2021-12-12 DIAGNOSIS — I5031 Acute diastolic (congestive) heart failure: Secondary | ICD-10-CM | POA: Diagnosis not present

## 2021-12-12 DIAGNOSIS — K219 Gastro-esophageal reflux disease without esophagitis: Secondary | ICD-10-CM | POA: Diagnosis not present

## 2021-12-12 DIAGNOSIS — E1169 Type 2 diabetes mellitus with other specified complication: Secondary | ICD-10-CM | POA: Diagnosis not present

## 2021-12-12 DIAGNOSIS — R2689 Other abnormalities of gait and mobility: Secondary | ICD-10-CM | POA: Diagnosis not present

## 2021-12-12 DIAGNOSIS — I4729 Other ventricular tachycardia: Secondary | ICD-10-CM | POA: Diagnosis not present

## 2021-12-12 DIAGNOSIS — I4891 Unspecified atrial fibrillation: Secondary | ICD-10-CM | POA: Diagnosis not present

## 2021-12-12 DIAGNOSIS — M2550 Pain in unspecified joint: Secondary | ICD-10-CM | POA: Diagnosis not present

## 2021-12-12 DIAGNOSIS — E46 Unspecified protein-calorie malnutrition: Secondary | ICD-10-CM | POA: Diagnosis not present

## 2021-12-12 DIAGNOSIS — I480 Paroxysmal atrial fibrillation: Secondary | ICD-10-CM | POA: Diagnosis not present

## 2021-12-12 DIAGNOSIS — N183 Chronic kidney disease, stage 3 unspecified: Secondary | ICD-10-CM | POA: Diagnosis not present

## 2021-12-12 DIAGNOSIS — J189 Pneumonia, unspecified organism: Secondary | ICD-10-CM | POA: Diagnosis not present

## 2021-12-12 DIAGNOSIS — I222 Subsequent non-ST elevation (NSTEMI) myocardial infarction: Secondary | ICD-10-CM | POA: Diagnosis not present

## 2021-12-12 DIAGNOSIS — E785 Hyperlipidemia, unspecified: Secondary | ICD-10-CM | POA: Diagnosis not present

## 2021-12-12 DIAGNOSIS — J9691 Respiratory failure, unspecified with hypoxia: Secondary | ICD-10-CM | POA: Diagnosis not present

## 2021-12-12 DIAGNOSIS — Z7901 Long term (current) use of anticoagulants: Secondary | ICD-10-CM | POA: Diagnosis not present

## 2021-12-12 DIAGNOSIS — Z7401 Bed confinement status: Secondary | ICD-10-CM | POA: Diagnosis not present

## 2021-12-12 DIAGNOSIS — A419 Sepsis, unspecified organism: Secondary | ICD-10-CM | POA: Diagnosis not present

## 2021-12-12 DIAGNOSIS — I248 Other forms of acute ischemic heart disease: Secondary | ICD-10-CM | POA: Diagnosis not present

## 2021-12-12 DIAGNOSIS — I21A1 Myocardial infarction type 2: Secondary | ICD-10-CM | POA: Diagnosis not present

## 2021-12-12 DIAGNOSIS — M6281 Muscle weakness (generalized): Secondary | ICD-10-CM | POA: Diagnosis not present

## 2021-12-12 DIAGNOSIS — G609 Hereditary and idiopathic neuropathy, unspecified: Secondary | ICD-10-CM | POA: Diagnosis not present

## 2021-12-12 DIAGNOSIS — R5381 Other malaise: Secondary | ICD-10-CM | POA: Diagnosis not present

## 2021-12-12 DIAGNOSIS — E039 Hypothyroidism, unspecified: Secondary | ICD-10-CM | POA: Diagnosis not present

## 2021-12-12 DIAGNOSIS — E1165 Type 2 diabetes mellitus with hyperglycemia: Secondary | ICD-10-CM | POA: Diagnosis not present

## 2021-12-12 DIAGNOSIS — I110 Hypertensive heart disease with heart failure: Secondary | ICD-10-CM | POA: Diagnosis not present

## 2021-12-12 DIAGNOSIS — I482 Chronic atrial fibrillation, unspecified: Secondary | ICD-10-CM | POA: Diagnosis not present

## 2021-12-12 DIAGNOSIS — E119 Type 2 diabetes mellitus without complications: Secondary | ICD-10-CM | POA: Diagnosis not present

## 2021-12-12 DIAGNOSIS — J9601 Acute respiratory failure with hypoxia: Secondary | ICD-10-CM | POA: Diagnosis not present

## 2021-12-12 DIAGNOSIS — N400 Enlarged prostate without lower urinary tract symptoms: Secondary | ICD-10-CM | POA: Diagnosis not present

## 2021-12-13 DIAGNOSIS — L89313 Pressure ulcer of right buttock, stage 3: Secondary | ICD-10-CM | POA: Diagnosis not present

## 2021-12-14 ENCOUNTER — Encounter (HOSPITAL_COMMUNITY): Payer: Self-pay | Admitting: Oncology

## 2021-12-14 DIAGNOSIS — I2510 Atherosclerotic heart disease of native coronary artery without angina pectoris: Secondary | ICD-10-CM | POA: Diagnosis not present

## 2021-12-14 DIAGNOSIS — I5031 Acute diastolic (congestive) heart failure: Secondary | ICD-10-CM | POA: Diagnosis not present

## 2021-12-14 DIAGNOSIS — N179 Acute kidney failure, unspecified: Secondary | ICD-10-CM | POA: Diagnosis not present

## 2021-12-14 DIAGNOSIS — J9691 Respiratory failure, unspecified with hypoxia: Secondary | ICD-10-CM | POA: Diagnosis not present

## 2021-12-14 DIAGNOSIS — L89150 Pressure ulcer of sacral region, unstageable: Secondary | ICD-10-CM | POA: Diagnosis not present

## 2021-12-14 DIAGNOSIS — R2689 Other abnormalities of gait and mobility: Secondary | ICD-10-CM | POA: Diagnosis not present

## 2021-12-14 DIAGNOSIS — W19XXXA Unspecified fall, initial encounter: Secondary | ICD-10-CM | POA: Diagnosis not present

## 2021-12-14 DIAGNOSIS — L89153 Pressure ulcer of sacral region, stage 3: Secondary | ICD-10-CM | POA: Diagnosis not present

## 2021-12-14 DIAGNOSIS — G609 Hereditary and idiopathic neuropathy, unspecified: Secondary | ICD-10-CM | POA: Diagnosis not present

## 2021-12-14 DIAGNOSIS — E039 Hypothyroidism, unspecified: Secondary | ICD-10-CM | POA: Diagnosis not present

## 2021-12-14 DIAGNOSIS — R5381 Other malaise: Secondary | ICD-10-CM | POA: Diagnosis not present

## 2021-12-14 DIAGNOSIS — F5102 Adjustment insomnia: Secondary | ICD-10-CM | POA: Diagnosis not present

## 2021-12-14 DIAGNOSIS — E785 Hyperlipidemia, unspecified: Secondary | ICD-10-CM | POA: Diagnosis not present

## 2021-12-14 DIAGNOSIS — J189 Pneumonia, unspecified organism: Secondary | ICD-10-CM | POA: Diagnosis not present

## 2021-12-14 DIAGNOSIS — E1151 Type 2 diabetes mellitus with diabetic peripheral angiopathy without gangrene: Secondary | ICD-10-CM | POA: Diagnosis not present

## 2021-12-14 DIAGNOSIS — M7989 Other specified soft tissue disorders: Secondary | ICD-10-CM | POA: Diagnosis not present

## 2021-12-14 DIAGNOSIS — I10 Essential (primary) hypertension: Secondary | ICD-10-CM | POA: Diagnosis not present

## 2021-12-14 DIAGNOSIS — F32A Depression, unspecified: Secondary | ICD-10-CM | POA: Diagnosis not present

## 2021-12-14 DIAGNOSIS — D649 Anemia, unspecified: Secondary | ICD-10-CM | POA: Diagnosis not present

## 2021-12-14 DIAGNOSIS — D759 Disease of blood and blood-forming organs, unspecified: Secondary | ICD-10-CM | POA: Diagnosis not present

## 2021-12-14 DIAGNOSIS — I517 Cardiomegaly: Secondary | ICD-10-CM | POA: Diagnosis not present

## 2021-12-14 DIAGNOSIS — E46 Unspecified protein-calorie malnutrition: Secondary | ICD-10-CM | POA: Diagnosis not present

## 2021-12-14 DIAGNOSIS — I480 Paroxysmal atrial fibrillation: Secondary | ICD-10-CM | POA: Diagnosis not present

## 2021-12-14 DIAGNOSIS — M6281 Muscle weakness (generalized): Secondary | ICD-10-CM | POA: Diagnosis not present

## 2021-12-14 DIAGNOSIS — F4325 Adjustment disorder with mixed disturbance of emotions and conduct: Secondary | ICD-10-CM | POA: Diagnosis not present

## 2021-12-14 DIAGNOSIS — I110 Hypertensive heart disease with heart failure: Secondary | ICD-10-CM | POA: Diagnosis not present

## 2021-12-14 DIAGNOSIS — N1831 Chronic kidney disease, stage 3a: Secondary | ICD-10-CM | POA: Diagnosis not present

## 2021-12-14 DIAGNOSIS — I4891 Unspecified atrial fibrillation: Secondary | ICD-10-CM | POA: Diagnosis not present

## 2021-12-14 DIAGNOSIS — N1832 Chronic kidney disease, stage 3b: Secondary | ICD-10-CM | POA: Diagnosis not present

## 2021-12-14 DIAGNOSIS — I4721 Torsades de pointes: Secondary | ICD-10-CM | POA: Diagnosis not present

## 2021-12-14 DIAGNOSIS — I482 Chronic atrial fibrillation, unspecified: Secondary | ICD-10-CM | POA: Diagnosis not present

## 2021-12-14 DIAGNOSIS — F411 Generalized anxiety disorder: Secondary | ICD-10-CM | POA: Diagnosis not present

## 2021-12-14 DIAGNOSIS — N183 Chronic kidney disease, stage 3 unspecified: Secondary | ICD-10-CM | POA: Diagnosis not present

## 2021-12-14 DIAGNOSIS — Z7901 Long term (current) use of anticoagulants: Secondary | ICD-10-CM | POA: Diagnosis not present

## 2021-12-14 DIAGNOSIS — A419 Sepsis, unspecified organism: Secondary | ICD-10-CM | POA: Diagnosis not present

## 2021-12-14 DIAGNOSIS — E1022 Type 1 diabetes mellitus with diabetic chronic kidney disease: Secondary | ICD-10-CM | POA: Diagnosis not present

## 2021-12-14 DIAGNOSIS — E119 Type 2 diabetes mellitus without complications: Secondary | ICD-10-CM | POA: Diagnosis not present

## 2021-12-14 DIAGNOSIS — F03B3 Unspecified dementia, moderate, with mood disturbance: Secondary | ICD-10-CM | POA: Diagnosis not present

## 2021-12-14 DIAGNOSIS — R296 Repeated falls: Secondary | ICD-10-CM | POA: Diagnosis not present

## 2021-12-14 DIAGNOSIS — L89323 Pressure ulcer of left buttock, stage 3: Secondary | ICD-10-CM | POA: Diagnosis not present

## 2021-12-14 DIAGNOSIS — R609 Edema, unspecified: Secondary | ICD-10-CM | POA: Diagnosis not present

## 2021-12-14 DIAGNOSIS — K219 Gastro-esophageal reflux disease without esophagitis: Secondary | ICD-10-CM | POA: Diagnosis not present

## 2021-12-14 DIAGNOSIS — I222 Subsequent non-ST elevation (NSTEMI) myocardial infarction: Secondary | ICD-10-CM | POA: Diagnosis not present

## 2021-12-14 DIAGNOSIS — I248 Other forms of acute ischemic heart disease: Secondary | ICD-10-CM | POA: Diagnosis not present

## 2021-12-14 DIAGNOSIS — R262 Difficulty in walking, not elsewhere classified: Secondary | ICD-10-CM | POA: Diagnosis not present

## 2021-12-14 DIAGNOSIS — N400 Enlarged prostate without lower urinary tract symptoms: Secondary | ICD-10-CM | POA: Diagnosis not present

## 2021-12-17 ENCOUNTER — Encounter (HOSPITAL_COMMUNITY): Payer: Self-pay | Admitting: Oncology

## 2021-12-18 ENCOUNTER — Other Ambulatory Visit: Payer: Self-pay | Admitting: Oncology

## 2021-12-18 DIAGNOSIS — D649 Anemia, unspecified: Secondary | ICD-10-CM

## 2021-12-19 ENCOUNTER — Inpatient Hospital Stay: Payer: Medicare Other | Attending: Oncology

## 2021-12-19 ENCOUNTER — Other Ambulatory Visit: Payer: Self-pay

## 2021-12-19 ENCOUNTER — Other Ambulatory Visit: Payer: Self-pay | Admitting: Hematology and Oncology

## 2021-12-19 ENCOUNTER — Inpatient Hospital Stay (INDEPENDENT_AMBULATORY_CARE_PROVIDER_SITE_OTHER): Payer: Medicare Other | Admitting: Oncology

## 2021-12-19 ENCOUNTER — Other Ambulatory Visit: Payer: Self-pay | Admitting: Oncology

## 2021-12-19 DIAGNOSIS — D649 Anemia, unspecified: Secondary | ICD-10-CM

## 2021-12-19 DIAGNOSIS — D759 Disease of blood and blood-forming organs, unspecified: Secondary | ICD-10-CM

## 2021-12-19 DIAGNOSIS — I2510 Atherosclerotic heart disease of native coronary artery without angina pectoris: Secondary | ICD-10-CM | POA: Diagnosis not present

## 2021-12-19 DIAGNOSIS — E1151 Type 2 diabetes mellitus with diabetic peripheral angiopathy without gangrene: Secondary | ICD-10-CM | POA: Diagnosis not present

## 2021-12-19 DIAGNOSIS — N1831 Chronic kidney disease, stage 3a: Secondary | ICD-10-CM | POA: Diagnosis not present

## 2021-12-19 DIAGNOSIS — R262 Difficulty in walking, not elsewhere classified: Secondary | ICD-10-CM | POA: Diagnosis not present

## 2021-12-19 DIAGNOSIS — L89323 Pressure ulcer of left buttock, stage 3: Secondary | ICD-10-CM | POA: Diagnosis not present

## 2021-12-19 LAB — HEPATIC FUNCTION PANEL
ALT: 20 (ref 10–40)
AST: 25 (ref 14–40)
Alkaline Phosphatase: 83 (ref 25–125)
Bilirubin, Total: 0.8

## 2021-12-19 LAB — CBC AND DIFFERENTIAL
HCT: 34 — AB (ref 41–53)
Hemoglobin: 11.3 — AB (ref 13.5–17.5)
Neutrophils Absolute: 1
Platelets: 157 (ref 150–399)
WBC: 1.7

## 2021-12-19 LAB — BASIC METABOLIC PANEL
BUN: 30 — AB (ref 4–21)
CO2: 23 — AB (ref 13–22)
Chloride: 102 (ref 99–108)
Creatinine: 1.7 — AB (ref 0.6–1.3)
Glucose: 312
Potassium: 4.4 (ref 3.4–5.3)
Sodium: 134 — AB (ref 137–147)

## 2021-12-19 LAB — CBC
Absolute Lymphocytes: 0.36 — AB (ref 0.65–4.75)
MCV: 89 (ref 80–94)
RBC: 3.83 — AB (ref 3.87–5.11)

## 2021-12-19 LAB — COMPREHENSIVE METABOLIC PANEL
Albumin: 3.7 (ref 3.5–5.0)
Calcium: 9.2 (ref 8.7–10.7)

## 2021-12-19 NOTE — Progress Notes (Signed)
[facility]  [address] [place],  [place]  [address] [phone]  Clinic Day:  12/19/2021  Referring physician: [name], MD   HISTORY OF PRESENT ILLNESS:  The patient is a 76 y.o. male who I recently saw in the hospital for leukopenia and anemia.  As there was no plausible explanation behind the cytopenias, bone marrow biopsy was done for further evaluation.  He comes in today with his wife to go over his bone marrow biopsy results and their implications.  Of note, the patient is now in a local rehab facility trying to improve his strength.  As a pertains to his anemia, he denies having increased fatigue or any overt forms of blood loss.  As a pertains to his leukopenia, he denies having any spontaneous infections.  Of note, before he was discharged from the hospital, he was given 1 dose of G-CSF, which led to a brisk rise in his white count.  He was also given 1 unit of blood, which led to an improvement in his hemoglobin.  PHYSICAL EXAM:  Blood pressure (!) 151/74, pulse 71, temperature 97.6 F (36.4 C), resp. rate 16, height 5' 9.5" (1.765 m), SpO2 99 %. Wt Readings from Last 3 Encounters:  11/26/21 183 lb 6.4 oz (83.2 kg)  11/09/21 183 lb 6.4 oz (83.2 kg)  10/10/21 182 lb 9.6 oz (82.8 kg)   Body mass index is 26.7 kg/m. Performance status (ECOG): 2 - Symptomatic, <50% confined to bed Physical Exam Constitutional:      Appearance: Normal appearance. He is not ill-appearing.     Comments: A chronically ill-appearing older gentleman in wheelchair.  He does look stronger versus previously  HENT:     Mouth/Throat:     Mouth: Mucous membranes are moist.     Pharynx: Oropharynx is clear. No oropharyngeal exudate or posterior oropharyngeal erythema.  Cardiovascular:     Rate and Rhythm: Normal rate and regular rhythm.     Heart sounds: No murmur heard.   No friction rub. No gallop.  Pulmonary:     Effort: Pulmonary effort is normal. No  respiratory distress.     Breath sounds: Normal breath sounds. No wheezing, rhonchi or rales.  Abdominal:     General: Bowel sounds are normal. There is no distension.     Palpations: Abdomen is soft. There is no mass.     Tenderness: There is no abdominal tenderness.  Musculoskeletal:        General: No swelling.     Right lower leg: No edema.     Left lower leg: No edema.  Lymphadenopathy:     Cervical: No cervical adenopathy.     Upper Body:     Right upper body: No supraclavicular or axillary adenopathy.     Left upper body: No supraclavicular or axillary adenopathy.     Lower Body: No right inguinal adenopathy. No left inguinal adenopathy.  Skin:    General: Skin is warm.     Coloration: Skin is not jaundiced.     Findings: No lesion or rash.  Neurological:     General: No focal deficit present.     Mental Status: He is alert and oriented to person, place, and time. Mental status is at baseline.  Psychiatric:        Mood and Affect: Mood normal.        Behavior: Behavior normal.        Thought Content: Thought content normal.  PATHOLOGY: His bone marrow biopsy revealed the following: Surgical Pathology  THIS IS AN ADDENDUM REPORT   CASE: [id number]  PATIENT: Jeremiah Beasley  Bone Marrow Report  Addendum   Reason for Addendum #1:  Cytogenetics results   Clinical History: leukopenia    DIAGNOSIS:   BONE MARROW, ASPIRATE, CLOT, CORE:  -  Hypercellular marrow with dysmegakaryocytopoiesis  -  See comment   PERIPHERAL BLOOD:  -  Normocytic anemia and leukopenia  -  See complete blood cell count   COMMENT:   The morphologic features do not provide a precise explanation for the  patient's cytopenias.  The absence of significant morphologic dysplasia  makes a myelodysplastic syndrome less likely and alternative causes,  including drug/toxin, autoimmune, infection and nutritional deficiency,  should be considered.  Correlation with cytogenetics is recommended.   Additional sampling for molecular studies (myeloid mutation panel) may  be informative to further exclude a clonal process.  Preliminary results  of this case were discussed with Dr. Bobby Rumpf on December 10, 2021.   MICROSCOPIC DESCRIPTION:   PERIPHERAL BLOOD SMEAR: The peripheral blood has a normocytic anemia and  leukopenia.  There is a relative monocytosis and rare lymphocytes show  cytoplasmic projections.  Circulating blasts are not identified.   BONE MARROW ASPIRATE: Spicular, cellular and adequate for evaluation  Erythroid precursors: Adequate numbers with mild nuclear irregularities  (<5%)  Granulocytic precursors: Orderly maturation without overt dysplasia  Megakaryocytes: Present  Lymphocytes/plasma cells: No lymphocytosis or plasmacytosis   TOUCH PREPARATIONS: Cellular, no additional findings as compared to the  aspirate smears   CLOT AND BIOPSY: Examination of the bone marrow core biopsy and clot  section reveals a hypercellular marrow for age (50%). Myeloid and  erythroid elements are present in normal proportions (MPO, E-cadherin).  Blasts are not increased which is supported by CD34 immunohistochemistry  (performed on core and clot section).  Megakaryocytes are frequently  hypolobated or with widely separated nuclear lobes; they are loosely  clustered. Scattered lymphoid aggregates are present on the clot  section.  By immunohistochemistry, these aggregates are composed of an  admixture of B and T cells by CD20 and CD3.  There is a predominance of  T cells.  The B cells do not aberrantly CD5, CD10, BCL6, Bcl-2 or cyclin  D1.  CD138 highlights slightly increased plasma cells (5%) which are  polytypic by kappa and lambda in situ hybridization.  There are no  granulomas identified.   IRON STAIN: Iron stains are performed on a bone marrow aspirate or touch  imprint smear and section of clot. The controls stained appropriately.        Storage Iron: Present       Ring  Sideroblasts: Not identified   Cytogenetics:   Myelodysplasia FISH panel:   LABS:   CBC Latest Ref Rng & Units 12/19/2021 11/27/2021 11/27/2021  WBC - 1.7 3.2(L) 2.5(L)  Hemoglobin 13.5 - 17.5 11.3(A) 7.6(L) 7.1(L)  Hematocrit 41 - 53 34(A) 23.9(L) 21.9(L)  Platelets 150 - 399 157 202 142(L)   ASSESSMENT & PLAN:  Assessment/Plan:  A 76 y.o. male with both leukopenia and anemia.  In clinic today, I went over all of his bone marrow biopsy results with him, for which no ominous pathology was appreciated.  When evaluating his labs today, I am pleased as his hemoglobin is better today than it was at the time of discharge.  However, this gentleman's white count has fallen.  When evaluating his medication list, the patient is taking hydralazine,  which can cause agranulocytosis.  I did speak with his provider at the rehab facility, who has agreed to have his hydralazine discontinued.  I have no problem with him being switched to a different antihypertensive to get his blood pressure under control.  My hopes are that his hydralazine discontinuation will lead to his white count improving over time.  Moving forward, this gentleman's CBC will be checked on a monthly basis to reassess his cytopenias.  I will see him back in 2 months for repeat clinical assessment.  The patient and his wife understand all the plans discussed today and are in agreement with them.      Evana Runnels Macarthur Critchley, MD

## 2021-12-20 ENCOUNTER — Encounter: Payer: Self-pay | Admitting: Cardiology

## 2021-12-20 ENCOUNTER — Ambulatory Visit (INDEPENDENT_AMBULATORY_CARE_PROVIDER_SITE_OTHER): Payer: Medicare Other | Admitting: Cardiology

## 2021-12-20 ENCOUNTER — Telehealth: Payer: Self-pay

## 2021-12-20 VITALS — BP 120/60 | HR 60 | Ht 69.5 in

## 2021-12-20 DIAGNOSIS — I10 Essential (primary) hypertension: Secondary | ICD-10-CM | POA: Diagnosis not present

## 2021-12-20 DIAGNOSIS — I480 Paroxysmal atrial fibrillation: Secondary | ICD-10-CM | POA: Diagnosis not present

## 2021-12-20 DIAGNOSIS — I4721 Torsades de pointes: Secondary | ICD-10-CM | POA: Diagnosis not present

## 2021-12-20 DIAGNOSIS — N1832 Chronic kidney disease, stage 3b: Secondary | ICD-10-CM | POA: Diagnosis not present

## 2021-12-20 DIAGNOSIS — L89150 Pressure ulcer of sacral region, unstageable: Secondary | ICD-10-CM

## 2021-12-20 DIAGNOSIS — E1022 Type 1 diabetes mellitus with diabetic chronic kidney disease: Secondary | ICD-10-CM

## 2021-12-20 MED ORDER — CLONIDINE HCL 0.1 MG PO TABS: 0.1000 mg | ORAL_TABLET | ORAL | 1 refills | Status: AC | PRN

## 2021-12-20 NOTE — Telephone Encounter (Signed)
I spoke with Nadara Mustard, nurse @ [facility]. He reports that Dr Agustin Cree discontinues the hydralazine and added Clonidine 0.1mg  po qd PRN BP >160/100. Dr Sallyanne Kuster added Toprol XL 25mg  po qd.  Dr Bobby Rumpf notified and agreement with all of above. Med reconciliation done.

## 2021-12-20 NOTE — Progress Notes (Signed)
Cardiology Office Note:    Date:  12/20/2021   ID:  Jeremiah Sheriff., DOB [birth year], MRN [id number]  PCP:  [name], MD  Cardiologist:  [name], MD    Referring MD: [name], [name], MD   Chief Complaint  Patient presents with   Follow-up  Weak and tired  History of Present Illness:    Jeremiah Kau. is a 76 y.o. male   with past medical history significant for paroxysmal atrial fibrillation, initially successfully suppressed with amiodarone but then became bradycardic and amiodarone actually was discontinued.  Also have history of diabetes, essential hypertension, dyslipidemia.  He is intolerant to ACE inhibitor because of tongue swelling. Recently he ended up going to the hospital because of weakness fatigue.  He was found to be anemic also on the hospital he got episode of torsades that was treated with mexiletine as well as correction of his potassium magnesium and theophylline.  While in the hospital he suffered from sacral decubitus pressure ulcer, also was very anemic with a low white blood cell count.  Bone marrow has been performed initial suspicion for myelodysplasia was raised.  Now he is coming for follow-up.  Weak tired exhausted.  According to his wife he is not doing much she is TRYING to do some exercise with some limitations.  He is falling asleep during our conversation.  Just simply not doing well.  Past Medical History:  Diagnosis Date   Atrial fibrillation (HCC)    Chronic pain of right knee 04/11/2020   Diabetes mellitus without complication ([place])    Essential hypertension 05/15/2015   Hyperlipidemia    Hypertension    MRSA (methicillin resistant staph aureus) culture positive    Paroxysmal atrial fibrillation (HCC) 05/15/2015   Pneumonia 10/20/2020   Pure hypercholesterolemia 05/15/2015   S/P right knee arthroscopy 04/25/2020   Type 2 diabetes mellitus without complication ([place]) 71/04/2693    Past Surgical History:  Procedure Laterality  Date   BIOPSY  10/10/2021   Procedure: BIOPSY;  Surgeon: Otis Brace, MD;  Location: [facility];  Service: Gastroenterology;;   COLONOSCOPY WITH PROPOFOL N/A 11/26/2021   Procedure: COLONOSCOPY WITH PROPOFOL;  Surgeon: Jerene Bears, MD;  Location: [place];  Service: Gastroenterology;  Laterality: N/A;   ESOPHAGOGASTRODUODENOSCOPY (EGD) WITH PROPOFOL N/A 10/10/2021   Procedure: ESOPHAGOGASTRODUODENOSCOPY (EGD) WITH PROPOFOL;  Surgeon: Otis Brace, MD;  Location: [facility];  Service: Gastroenterology;  Laterality: N/A;   gall bladder removed   10/20/2020   HEMOSTASIS CONTROL  11/26/2021   Procedure: HEMOSTASIS CONTROL;  Surgeon: Jerene Bears, MD;  Location: [facility];  Service: Gastroenterology;;   Iron infusion  05/18/2021   x2   NO PAST SURGERIES     POLYPECTOMY  11/26/2021   Procedure: POLYPECTOMY;  Surgeon: Jerene Bears, MD;  Location: [facility];  Service: Gastroenterology;;    Current Medications: Current Meds  Medication Sig   acetaminophen (TYLENOL) 325 MG tablet Take 2 tablets (650 mg total) by mouth every 6 (six) hours as needed for mild pain, fever or headache.   amLODipine (NORVASC) 10 MG tablet Take 10 mg by mouth daily.   atorvastatin (LIPITOR) 40 MG tablet Take 40 mg by mouth daily.   Cholecalciferol (VITAMIN D3) 25 MCG (1000 UT) CAPS Take 2,000 Units by mouth daily.   Cyanocobalamin (B-12) 1000 MCG TABS Take 2,000 mcg by mouth daily.   ezetimibe (ZETIA) 10 MG tablet Take 1 tablet (10 mg total) by mouth daily.   ferrous sulfate 325 (  65 FE) MG tablet Take 1 tablet (325 mg total) by mouth 2 (two) times daily with a meal.   furosemide (LASIX) 40 MG tablet Take 40 mg by mouth daily.   gabapentin (NEURONTIN) 300 MG capsule Take 1 capsule (300 mg total) by mouth 2 (two) times daily.   hydrALAZINE (APRESOLINE) 100 MG tablet Take 0.5 tablets (50 mg total) by mouth 2 (two) times daily.   LANTUS 100 UNIT/ML injection Inject 45 Units into the skin daily.    levothyroxine (SYNTHROID, LEVOTHROID) 75 MCG tablet Take 75 mcg by mouth daily before breakfast.   magnesium hydroxide (MILK OF MAGNESIA) 400 MG/5ML suspension Take 30 mLs by mouth daily as needed for mild constipation.   mexiletine (MEXITIL) 150 MG capsule Take 150 mg by mouth 2 (two) times daily.   Multiple Vitamins-Minerals (CENTRUM SILVER ADULT 50+ PO) Take 1 tablet by mouth daily with breakfast.   omeprazole (PRILOSEC) 40 MG capsule Take 40 mg by mouth daily before supper.   simethicone (MYLICON) 80 MG chewable tablet Chew 80 mg by mouth every 6 (six) hours as needed for flatulence.   tamsulosin (FLOMAX) 0.4 MG CAPS capsule Take 0.4 mg by mouth daily.   THEO-24 300 MG 24 hr capsule Take 300 mg by mouth daily.     Allergies:   Quinapril, Methaqualone, and Tape   Social History   Socioeconomic History   Marital status: Married    Spouse name: Not on file   Number of children: Not on file   Years of education: Not on file   Highest education level: Not on file  Occupational History   Not on file  Tobacco Use   Smoking status: Never   Smokeless tobacco: Current  Vaping Use   Vaping Use: Never used  Substance and Sexual Activity   Alcohol use: No   Drug use: No   Sexual activity: Not on file  Other Topics Concern   Not on file  Social History Narrative   Likes bird/deer watching, grand children   Social Determinants of Health   Financial Resource Strain: Low Risk    Difficulty of Paying Living Expenses: Not hard at all  Food Insecurity: No Food Insecurity   Worried About Charity fundraiser in the Last Year: Never true   [place] in the Last Year: Never true  Transportation Needs: No Transportation Needs   Lack of Transportation (Medical): No   Lack of Transportation (Non-Medical): No  Physical Activity: Not on file  Stress: No Stress Concern Present   Feeling of Stress : Only a little  Social Connections: Not on file     Family History: The patient's  family history includes Heart attack in his father; Hyperlipidemia in his father; Stroke in his father. ROS:   Please see the history of present illness.    All 14 point review of systems negative except as described per history of present illness  EKGs/Labs/Other Studies Reviewed:      Recent Labs: 10/06/2021: B Natriuretic Peptide 88.7 10/07/2021: TSH 5.005 10/10/2021: Magnesium 2.0 12/19/2021: ALT 20; BUN 30; Creatinine 1.7; Hemoglobin 11.3; Platelets 157; Potassium 4.4; Sodium 134  Recent Lipid Panel    Component Value Date/Time   CHOL 134 05/31/2020 1324   TRIG 82 05/31/2020 1324   HDL 32 (L) 05/31/2020 1324   CHOLHDL 4.2 05/31/2020 1324   LDLCALC 86 05/31/2020 1324    Physical Exam:    VS:  BP 120/60 (BP Location: Right Arm, Patient Position: Sitting,  Cuff Size: Normal)    Pulse 60    Ht 5' 9.5" (1.765 m)    SpO2 97%    BMI 26.70 kg/m     Wt Readings from Last 3 Encounters:  11/26/21 183 lb 6.4 oz (83.2 kg)  11/09/21 183 lb 6.4 oz (83.2 kg)  10/10/21 182 lb 9.6 oz (82.8 kg)     GEN:  Well nourished, well developed in no acute distress HEENT: Normal NECK: No JVD; No carotid bruits LYMPHATICS: No lymphadenopathy CARDIAC: RRR, no murmurs, no rubs, no gallops RESPIRATORY:  Clear to auscultation without rales, wheezing or rhonchi  ABDOMEN: Soft, non-tender, non-distended MUSCULOSKELETAL:  No edema; No deformity  SKIN: Warm and dry LOWER EXTREMITIES: no swelling NEUROLOGIC:  Alert and oriented x 3 PSYCHIATRIC:  Normal affect   ASSESSMENT:    1. Essential (primary) hypertension   2. Torsades de pointes   3. Paroxysmal atrial fibrillation (HCC)   4. Type 1 diabetes mellitus with stage 3b chronic kidney disease ([place])   5. Pressure injury of sacral region, unstageable ([place])    PLAN:    In order of problems listed above:  Very complex situation torsades denies have any palpitation dizziness I did review blood work done by his oncologist yesterday, potassium is  still within normal range.  We will continue mexiletine as well as theophylline. Essential hypertension, blood pressure control.  There was some suspicion for high oncology team about hydralazine creating problem I will discontinue this medication.  I will give prescription for clonidine if blood pressures more than 150/100 and clonidine can be used. Paroxysmal atrial fibrillation seems to maintaining sinus rhythm, he is off anticoagulation because of severe anemia he is on amiodarone which we will continue. Overall he is debilitated.  Making very small progress if at all.  See him back in about 2 months.   Medication Adjustments/Labs and Tests Ordered: Current medicines are reviewed at length with the patient today.  Concerns regarding medicines are outlined above.  No orders of the defined types were placed in this encounter.  Medication changes: No orders of the defined types were placed in this encounter.   Signed, Jeremiah Liter, MD, [facility] 12/20/2021 9:57 AM    [place]

## 2021-12-20 NOTE — Patient Instructions (Signed)
Medication Instructions:  Your physician has recommended you make the following change in your medication:   START: Clonidine 0.1 mg as needed for blood pressure greater than 160/110  *If you need a refill on your cardiac medications before your next appointment, please call your pharmacy*   Lab Work: None If you have labs (blood work) drawn today and your tests are completely normal, you will receive your results only by: [place] (if you have MyChart) OR A paper copy in the mail If you have any lab test that is abnormal or we need to change your treatment, we will call you to review the results.   Testing/Procedures: None   Follow-Up: At [facility], you and your health needs are our priority.  As part of our continuing mission to provide you with exceptional heart care, we have created designated Provider Care Teams.  These Care Teams include your primary Cardiologist (physician) and Advanced Practice Providers (APPs -  Physician Assistants and Nurse Practitioners) who all work together to provide you with the care you need, when you need it.  We recommend signing up for the patient portal called "MyChart".  Sign up information is provided on this After Visit Summary.  MyChart is used to connect with patients for Virtual Visits (Telemedicine).  Patients are able to view lab/test results, encounter notes, upcoming appointments, etc.  Non-urgent messages can be sent to your provider as well.   To learn more about what you can do with MyChart, go to [email or web address].    Your next appointment:   1 month(s)  The format for your next appointment:   In Person  Provider:   [name], MD    Other Instructions None

## 2021-12-26 DIAGNOSIS — L89153 Pressure ulcer of sacral region, stage 3: Secondary | ICD-10-CM | POA: Diagnosis not present

## 2021-12-27 ENCOUNTER — Telehealth: Payer: Self-pay | Admitting: Neurology

## 2021-12-27 NOTE — Telephone Encounter (Signed)
Patients wife would like a call to discuss her husbands condition. She stated he has totally debilitated and needs advice

## 2021-12-31 NOTE — Telephone Encounter (Signed)
Called and spoke to patients wife who states its been a roller coaster ride. Sometimes he walks and sometimes he doesn't. Patient is currently in a nursing home for therapy and his legs does not want to work. Wife reports blood counts are low and patient has had a few blood transfusions. Patient wife states that there has got to be some underlying issue going on and wondering if there is a test that can be done. Patients wife states he can only walk 5-6 steps with gait belt and stays in a wheel chair when he is up. This has been going on for about 8-9 months.  Patients wife is aware a message will be sent to Dr. Posey Pronto and I will call her back once I hear back.

## 2022-01-02 ENCOUNTER — Encounter: Payer: Self-pay | Admitting: Cardiology

## 2022-01-02 DIAGNOSIS — L89153 Pressure ulcer of sacral region, stage 3: Secondary | ICD-10-CM | POA: Diagnosis not present

## 2022-01-02 NOTE — Telephone Encounter (Signed)
Much of his weakness may be stemming from his low cells counts and anemia; however, I am happy to reevaluate him in the office to determine if there is neurological testing indicated.  He will need to be placed on the wait list, as I do not have an immediate openings.

## 2022-01-03 ENCOUNTER — Telehealth: Payer: Self-pay

## 2022-01-03 DIAGNOSIS — F03B3 Unspecified dementia, moderate, with mood disturbance: Secondary | ICD-10-CM | POA: Diagnosis not present

## 2022-01-03 DIAGNOSIS — F32A Depression, unspecified: Secondary | ICD-10-CM | POA: Diagnosis not present

## 2022-01-03 DIAGNOSIS — R296 Repeated falls: Secondary | ICD-10-CM | POA: Diagnosis not present

## 2022-01-03 DIAGNOSIS — F5102 Adjustment insomnia: Secondary | ICD-10-CM | POA: Diagnosis not present

## 2022-01-03 DIAGNOSIS — F4325 Adjustment disorder with mixed disturbance of emotions and conduct: Secondary | ICD-10-CM | POA: Diagnosis not present

## 2022-01-03 DIAGNOSIS — F411 Generalized anxiety disorder: Secondary | ICD-10-CM | POA: Diagnosis not present

## 2022-01-03 NOTE — Telephone Encounter (Signed)
Called patient's wife to get more information. Based on the patients current condition I recommended that the patient go to the ER so he could be evaluated.

## 2022-01-04 DIAGNOSIS — I5033 Acute on chronic diastolic (congestive) heart failure: Secondary | ICD-10-CM | POA: Diagnosis not present

## 2022-01-04 DIAGNOSIS — E039 Hypothyroidism, unspecified: Secondary | ICD-10-CM | POA: Diagnosis not present

## 2022-01-04 DIAGNOSIS — F0390 Unspecified dementia without behavioral disturbance: Secondary | ICD-10-CM | POA: Diagnosis not present

## 2022-01-04 DIAGNOSIS — I2510 Atherosclerotic heart disease of native coronary artery without angina pectoris: Secondary | ICD-10-CM | POA: Diagnosis not present

## 2022-01-04 DIAGNOSIS — Z79899 Other long term (current) drug therapy: Secondary | ICD-10-CM | POA: Diagnosis not present

## 2022-01-04 DIAGNOSIS — A419 Sepsis, unspecified organism: Secondary | ICD-10-CM | POA: Diagnosis not present

## 2022-01-04 DIAGNOSIS — E119 Type 2 diabetes mellitus without complications: Secondary | ICD-10-CM | POA: Diagnosis not present

## 2022-01-04 DIAGNOSIS — R7989 Other specified abnormal findings of blood chemistry: Secondary | ICD-10-CM | POA: Diagnosis not present

## 2022-01-04 DIAGNOSIS — I517 Cardiomegaly: Secondary | ICD-10-CM | POA: Diagnosis not present

## 2022-01-04 DIAGNOSIS — N400 Enlarged prostate without lower urinary tract symptoms: Secondary | ICD-10-CM | POA: Diagnosis not present

## 2022-01-04 DIAGNOSIS — Z7901 Long term (current) use of anticoagulants: Secondary | ICD-10-CM | POA: Diagnosis not present

## 2022-01-04 DIAGNOSIS — I4891 Unspecified atrial fibrillation: Secondary | ICD-10-CM | POA: Diagnosis not present

## 2022-01-04 DIAGNOSIS — N183 Chronic kidney disease, stage 3 unspecified: Secondary | ICD-10-CM | POA: Diagnosis not present

## 2022-01-04 DIAGNOSIS — E1122 Type 2 diabetes mellitus with diabetic chronic kidney disease: Secondary | ICD-10-CM | POA: Diagnosis not present

## 2022-01-04 DIAGNOSIS — I248 Other forms of acute ischemic heart disease: Secondary | ICD-10-CM | POA: Diagnosis not present

## 2022-01-04 DIAGNOSIS — G609 Hereditary and idiopathic neuropathy, unspecified: Secondary | ICD-10-CM | POA: Diagnosis not present

## 2022-01-04 DIAGNOSIS — M7989 Other specified soft tissue disorders: Secondary | ICD-10-CM | POA: Diagnosis not present

## 2022-01-04 DIAGNOSIS — N179 Acute kidney failure, unspecified: Secondary | ICD-10-CM | POA: Diagnosis not present

## 2022-01-04 DIAGNOSIS — Z794 Long term (current) use of insulin: Secondary | ICD-10-CM | POA: Diagnosis not present

## 2022-01-04 DIAGNOSIS — R531 Weakness: Secondary | ICD-10-CM | POA: Diagnosis not present

## 2022-01-04 DIAGNOSIS — E46 Unspecified protein-calorie malnutrition: Secondary | ICD-10-CM | POA: Diagnosis not present

## 2022-01-04 DIAGNOSIS — K219 Gastro-esophageal reflux disease without esophagitis: Secondary | ICD-10-CM | POA: Diagnosis not present

## 2022-01-04 DIAGNOSIS — I130 Hypertensive heart and chronic kidney disease with heart failure and stage 1 through stage 4 chronic kidney disease, or unspecified chronic kidney disease: Secondary | ICD-10-CM | POA: Diagnosis not present

## 2022-01-04 DIAGNOSIS — I482 Chronic atrial fibrillation, unspecified: Secondary | ICD-10-CM | POA: Diagnosis not present

## 2022-01-04 DIAGNOSIS — R2689 Other abnormalities of gait and mobility: Secondary | ICD-10-CM | POA: Diagnosis not present

## 2022-01-04 DIAGNOSIS — I252 Old myocardial infarction: Secondary | ICD-10-CM | POA: Diagnosis not present

## 2022-01-04 DIAGNOSIS — M6281 Muscle weakness (generalized): Secondary | ICD-10-CM | POA: Diagnosis not present

## 2022-01-04 DIAGNOSIS — I10 Essential (primary) hypertension: Secondary | ICD-10-CM | POA: Diagnosis not present

## 2022-01-04 DIAGNOSIS — D649 Anemia, unspecified: Secondary | ICD-10-CM | POA: Diagnosis not present

## 2022-01-04 DIAGNOSIS — R0602 Shortness of breath: Secondary | ICD-10-CM | POA: Diagnosis not present

## 2022-01-04 DIAGNOSIS — I110 Hypertensive heart disease with heart failure: Secondary | ICD-10-CM | POA: Diagnosis not present

## 2022-01-04 DIAGNOSIS — I222 Subsequent non-ST elevation (NSTEMI) myocardial infarction: Secondary | ICD-10-CM | POA: Diagnosis not present

## 2022-01-04 DIAGNOSIS — I5031 Acute diastolic (congestive) heart failure: Secondary | ICD-10-CM | POA: Diagnosis not present

## 2022-01-04 DIAGNOSIS — R609 Edema, unspecified: Secondary | ICD-10-CM | POA: Diagnosis not present

## 2022-01-04 DIAGNOSIS — Z7401 Bed confinement status: Secondary | ICD-10-CM | POA: Diagnosis not present

## 2022-01-04 DIAGNOSIS — Z888 Allergy status to other drugs, medicaments and biological substances status: Secondary | ICD-10-CM | POA: Diagnosis not present

## 2022-01-04 DIAGNOSIS — E785 Hyperlipidemia, unspecified: Secondary | ICD-10-CM | POA: Diagnosis present

## 2022-01-04 DIAGNOSIS — J9691 Respiratory failure, unspecified with hypoxia: Secondary | ICD-10-CM | POA: Diagnosis not present

## 2022-01-04 NOTE — Telephone Encounter (Signed)
Called and spoke to patients wife. Informed her of advice and recommendations from Dr. Posey Pronto. Patients wife stated that she was informed by hematology that patients counts are not low enough to cause falls. Patient was taken to the hospital and is currently admitted. Wife stated he fell 2x yesterday and 4x while in the nursing home. He is also in a wheelchair majority of the time. Patients wife stated that she was advised by the hospital for him to f/u with oncology/hematology and Neurology. I informed patients wife that I will have someone from the front call her to make patient an appt and add him to the waitlist.

## 2022-01-07 ENCOUNTER — Ambulatory Visit: Payer: Medicare Other | Admitting: Internal Medicine

## 2022-01-07 DIAGNOSIS — E785 Hyperlipidemia, unspecified: Secondary | ICD-10-CM | POA: Diagnosis not present

## 2022-01-07 DIAGNOSIS — I222 Subsequent non-ST elevation (NSTEMI) myocardial infarction: Secondary | ICD-10-CM | POA: Diagnosis not present

## 2022-01-07 DIAGNOSIS — I4721 Torsades de pointes: Secondary | ICD-10-CM | POA: Diagnosis not present

## 2022-01-07 DIAGNOSIS — I130 Hypertensive heart and chronic kidney disease with heart failure and stage 1 through stage 4 chronic kidney disease, or unspecified chronic kidney disease: Secondary | ICD-10-CM | POA: Diagnosis not present

## 2022-01-07 DIAGNOSIS — R2689 Other abnormalities of gait and mobility: Secondary | ICD-10-CM | POA: Diagnosis not present

## 2022-01-07 DIAGNOSIS — R609 Edema, unspecified: Secondary | ICD-10-CM | POA: Diagnosis not present

## 2022-01-07 DIAGNOSIS — M1990 Unspecified osteoarthritis, unspecified site: Secondary | ICD-10-CM | POA: Diagnosis not present

## 2022-01-07 DIAGNOSIS — Z7901 Long term (current) use of anticoagulants: Secondary | ICD-10-CM | POA: Diagnosis not present

## 2022-01-07 DIAGNOSIS — N1831 Chronic kidney disease, stage 3a: Secondary | ICD-10-CM | POA: Diagnosis not present

## 2022-01-07 DIAGNOSIS — R5381 Other malaise: Secondary | ICD-10-CM | POA: Diagnosis not present

## 2022-01-07 DIAGNOSIS — Z8701 Personal history of pneumonia (recurrent): Secondary | ICD-10-CM | POA: Diagnosis not present

## 2022-01-07 DIAGNOSIS — D509 Iron deficiency anemia, unspecified: Secondary | ICD-10-CM | POA: Diagnosis not present

## 2022-01-07 DIAGNOSIS — Z8619 Personal history of other infectious and parasitic diseases: Secondary | ICD-10-CM | POA: Diagnosis not present

## 2022-01-07 DIAGNOSIS — N183 Chronic kidney disease, stage 3 unspecified: Secondary | ICD-10-CM | POA: Diagnosis not present

## 2022-01-07 DIAGNOSIS — I248 Other forms of acute ischemic heart disease: Secondary | ICD-10-CM | POA: Diagnosis not present

## 2022-01-07 DIAGNOSIS — E441 Mild protein-calorie malnutrition: Secondary | ICD-10-CM | POA: Diagnosis not present

## 2022-01-07 DIAGNOSIS — Z872 Personal history of diseases of the skin and subcutaneous tissue: Secondary | ICD-10-CM | POA: Diagnosis not present

## 2022-01-07 DIAGNOSIS — I10 Essential (primary) hypertension: Secondary | ICD-10-CM | POA: Diagnosis not present

## 2022-01-07 DIAGNOSIS — N179 Acute kidney failure, unspecified: Secondary | ICD-10-CM | POA: Diagnosis not present

## 2022-01-07 DIAGNOSIS — R1310 Dysphagia, unspecified: Secondary | ICD-10-CM | POA: Diagnosis not present

## 2022-01-07 DIAGNOSIS — M6281 Muscle weakness (generalized): Secondary | ICD-10-CM | POA: Diagnosis not present

## 2022-01-07 DIAGNOSIS — J9691 Respiratory failure, unspecified with hypoxia: Secondary | ICD-10-CM | POA: Diagnosis not present

## 2022-01-07 DIAGNOSIS — R634 Abnormal weight loss: Secondary | ICD-10-CM | POA: Diagnosis not present

## 2022-01-07 DIAGNOSIS — G609 Hereditary and idiopathic neuropathy, unspecified: Secondary | ICD-10-CM | POA: Diagnosis not present

## 2022-01-07 DIAGNOSIS — D649 Anemia, unspecified: Secondary | ICD-10-CM | POA: Diagnosis not present

## 2022-01-07 DIAGNOSIS — E119 Type 2 diabetes mellitus without complications: Secondary | ICD-10-CM | POA: Diagnosis not present

## 2022-01-07 DIAGNOSIS — Z7401 Bed confinement status: Secondary | ICD-10-CM | POA: Diagnosis not present

## 2022-01-07 DIAGNOSIS — I5031 Acute diastolic (congestive) heart failure: Secondary | ICD-10-CM | POA: Diagnosis not present

## 2022-01-07 DIAGNOSIS — E46 Unspecified protein-calorie malnutrition: Secondary | ICD-10-CM | POA: Diagnosis not present

## 2022-01-07 DIAGNOSIS — I5032 Chronic diastolic (congestive) heart failure: Secondary | ICD-10-CM | POA: Diagnosis not present

## 2022-01-07 DIAGNOSIS — I110 Hypertensive heart disease with heart failure: Secondary | ICD-10-CM | POA: Diagnosis not present

## 2022-01-07 DIAGNOSIS — K219 Gastro-esophageal reflux disease without esophagitis: Secondary | ICD-10-CM | POA: Diagnosis not present

## 2022-01-07 DIAGNOSIS — I4891 Unspecified atrial fibrillation: Secondary | ICD-10-CM | POA: Diagnosis not present

## 2022-01-07 DIAGNOSIS — A419 Sepsis, unspecified organism: Secondary | ICD-10-CM | POA: Diagnosis not present

## 2022-01-07 DIAGNOSIS — L89153 Pressure ulcer of sacral region, stage 3: Secondary | ICD-10-CM | POA: Diagnosis not present

## 2022-01-07 DIAGNOSIS — I5033 Acute on chronic diastolic (congestive) heart failure: Secondary | ICD-10-CM | POA: Diagnosis not present

## 2022-01-07 DIAGNOSIS — E039 Hypothyroidism, unspecified: Secondary | ICD-10-CM | POA: Diagnosis not present

## 2022-01-07 DIAGNOSIS — E1022 Type 1 diabetes mellitus with diabetic chronic kidney disease: Secondary | ICD-10-CM | POA: Diagnosis not present

## 2022-01-07 DIAGNOSIS — I2510 Atherosclerotic heart disease of native coronary artery without angina pectoris: Secondary | ICD-10-CM | POA: Diagnosis not present

## 2022-01-07 DIAGNOSIS — I480 Paroxysmal atrial fibrillation: Secondary | ICD-10-CM | POA: Diagnosis not present

## 2022-01-07 DIAGNOSIS — R531 Weakness: Secondary | ICD-10-CM | POA: Diagnosis not present

## 2022-01-07 DIAGNOSIS — E1140 Type 2 diabetes mellitus with diabetic neuropathy, unspecified: Secondary | ICD-10-CM | POA: Diagnosis not present

## 2022-01-07 DIAGNOSIS — N400 Enlarged prostate without lower urinary tract symptoms: Secondary | ICD-10-CM | POA: Diagnosis not present

## 2022-01-08 DIAGNOSIS — I10 Essential (primary) hypertension: Secondary | ICD-10-CM | POA: Diagnosis not present

## 2022-01-08 DIAGNOSIS — E1140 Type 2 diabetes mellitus with diabetic neuropathy, unspecified: Secondary | ICD-10-CM | POA: Diagnosis not present

## 2022-01-09 DIAGNOSIS — L89153 Pressure ulcer of sacral region, stage 3: Secondary | ICD-10-CM | POA: Diagnosis not present

## 2022-01-10 DIAGNOSIS — R5381 Other malaise: Secondary | ICD-10-CM | POA: Diagnosis not present

## 2022-01-10 DIAGNOSIS — I5033 Acute on chronic diastolic (congestive) heart failure: Secondary | ICD-10-CM | POA: Diagnosis not present

## 2022-01-10 DIAGNOSIS — N1831 Chronic kidney disease, stage 3a: Secondary | ICD-10-CM | POA: Diagnosis not present

## 2022-01-10 DIAGNOSIS — E039 Hypothyroidism, unspecified: Secondary | ICD-10-CM | POA: Diagnosis not present

## 2022-01-15 ENCOUNTER — Telehealth: Payer: Self-pay | Admitting: Oncology

## 2022-01-15 NOTE — Telephone Encounter (Signed)
Patient's spouse called to Cancel All Future Appt's due to being placed under [place]

## 2022-01-16 ENCOUNTER — Other Ambulatory Visit: Payer: Medicare Other

## 2022-01-16 DIAGNOSIS — I2510 Atherosclerotic heart disease of native coronary artery without angina pectoris: Secondary | ICD-10-CM | POA: Diagnosis not present

## 2022-01-16 DIAGNOSIS — I130 Hypertensive heart and chronic kidney disease with heart failure and stage 1 through stage 4 chronic kidney disease, or unspecified chronic kidney disease: Secondary | ICD-10-CM | POA: Diagnosis not present

## 2022-01-16 DIAGNOSIS — E039 Hypothyroidism, unspecified: Secondary | ICD-10-CM | POA: Diagnosis not present

## 2022-01-16 DIAGNOSIS — R634 Abnormal weight loss: Secondary | ICD-10-CM | POA: Diagnosis not present

## 2022-01-16 DIAGNOSIS — E1022 Type 1 diabetes mellitus with diabetic chronic kidney disease: Secondary | ICD-10-CM | POA: Diagnosis not present

## 2022-01-16 DIAGNOSIS — R1310 Dysphagia, unspecified: Secondary | ICD-10-CM | POA: Diagnosis not present

## 2022-01-16 DIAGNOSIS — Z872 Personal history of diseases of the skin and subcutaneous tissue: Secondary | ICD-10-CM | POA: Diagnosis not present

## 2022-01-16 DIAGNOSIS — M1990 Unspecified osteoarthritis, unspecified site: Secondary | ICD-10-CM | POA: Diagnosis not present

## 2022-01-16 DIAGNOSIS — N400 Enlarged prostate without lower urinary tract symptoms: Secondary | ICD-10-CM | POA: Diagnosis not present

## 2022-01-16 DIAGNOSIS — I4721 Torsades de pointes: Secondary | ICD-10-CM | POA: Diagnosis not present

## 2022-01-16 DIAGNOSIS — I5032 Chronic diastolic (congestive) heart failure: Secondary | ICD-10-CM | POA: Diagnosis not present

## 2022-01-16 DIAGNOSIS — E441 Mild protein-calorie malnutrition: Secondary | ICD-10-CM | POA: Diagnosis not present

## 2022-01-16 DIAGNOSIS — N183 Chronic kidney disease, stage 3 unspecified: Secondary | ICD-10-CM | POA: Diagnosis not present

## 2022-01-16 DIAGNOSIS — I480 Paroxysmal atrial fibrillation: Secondary | ICD-10-CM | POA: Diagnosis not present

## 2022-01-16 DIAGNOSIS — D509 Iron deficiency anemia, unspecified: Secondary | ICD-10-CM | POA: Diagnosis not present

## 2022-01-16 DIAGNOSIS — Z8701 Personal history of pneumonia (recurrent): Secondary | ICD-10-CM | POA: Diagnosis not present

## 2022-01-16 DIAGNOSIS — Z8619 Personal history of other infectious and parasitic diseases: Secondary | ICD-10-CM | POA: Diagnosis not present

## 2022-01-17 ENCOUNTER — Ambulatory Visit: Payer: Medicare Other | Admitting: Cardiology

## 2022-01-24 LAB — SURGICAL PATHOLOGY

## 2022-02-05 IMAGING — NM NM DATSCAN
2 series · 24 of 30 positions shown · non-contrast
Comparison: None.

CLINICAL DATA: 74-year-old male with gait disturbance. LEFT hand
tremor.

EXAM:
NUCLEAR MEDICINE BRAIN IMAGING WITH SPECT  (DaTscan )
TECHNIQUE: SPECT images of the brain were obtained after intravenous injection
of radiopharmaceutical. 4 hour post injection imaging. Appropriate
positioning. 130 mg i-STAT given orally for thyroid blockade.
RADIOPHARMACEUTICALS:  4.9 millicuries I 123 Ioflupane

[Series 1: dat scan · 4.14mm/px · 5 of 120 frames shown]
[frame 11/120  full-range]
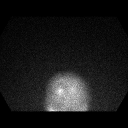
[frame 31/120  full-range]
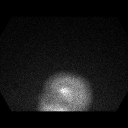
[frame 71/120  full-range]
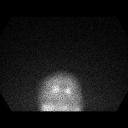
[frame 91/120  full-range]
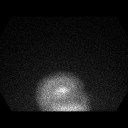
[frame 111/120  full-range]
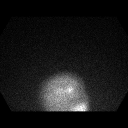

[Series 1020: mpr (id) range · 0.40mm/px · 19 of 35 slices shown]
[im 1/35]
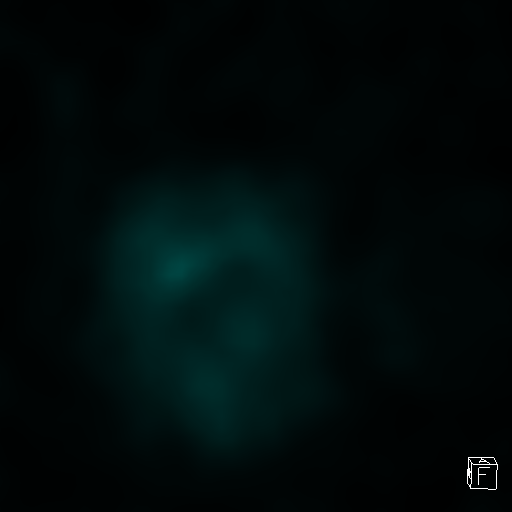
[im 3/35]
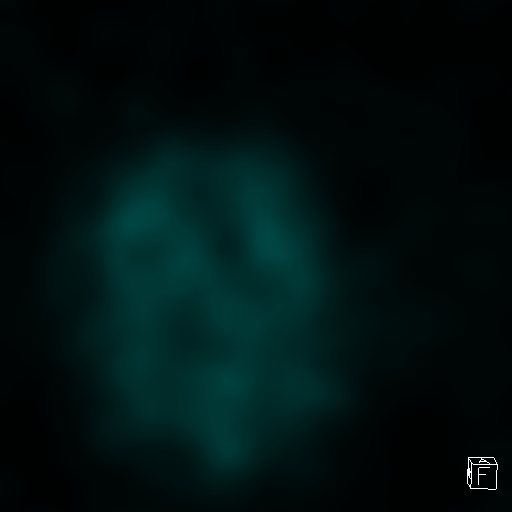
[im 5/35]
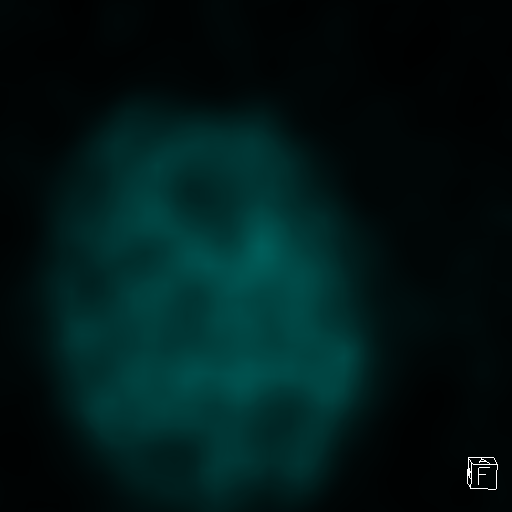
[im 6/35]
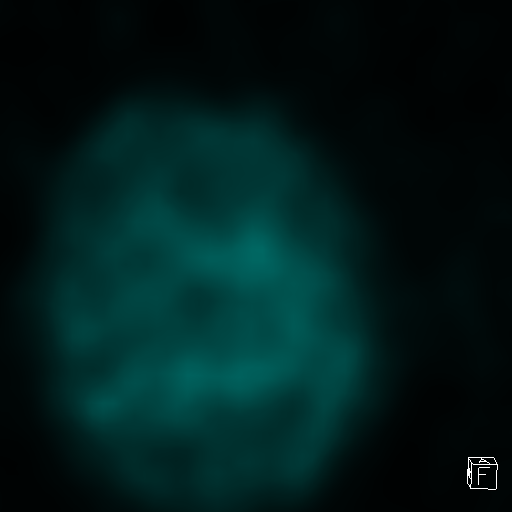
[im 8/35]
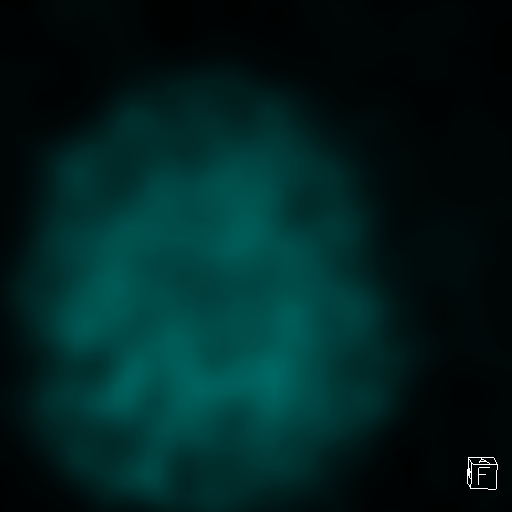
[im 11/35]
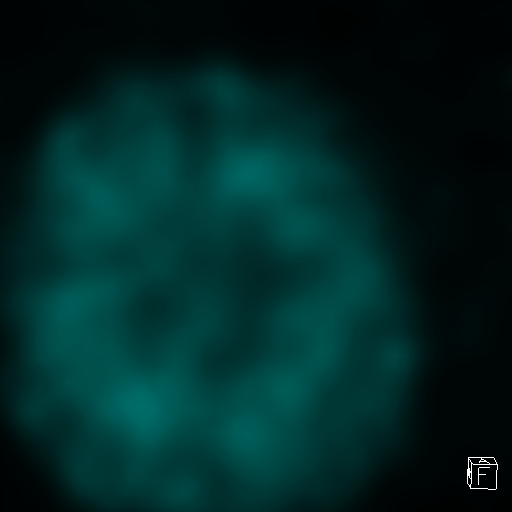
[im 12/35]
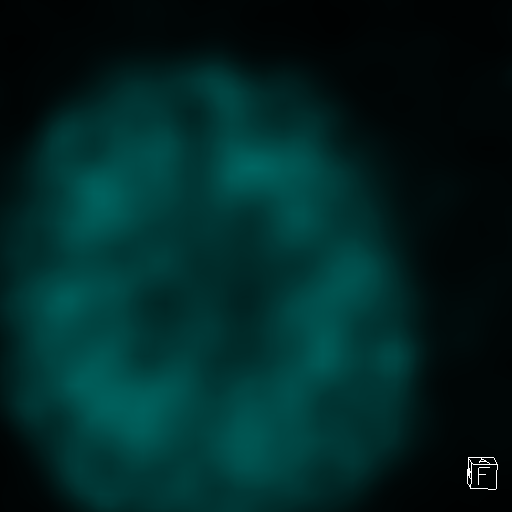
[im 14/35]
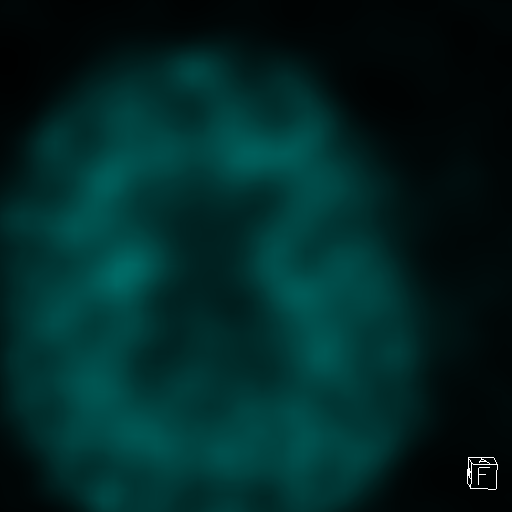
[im 15/35]
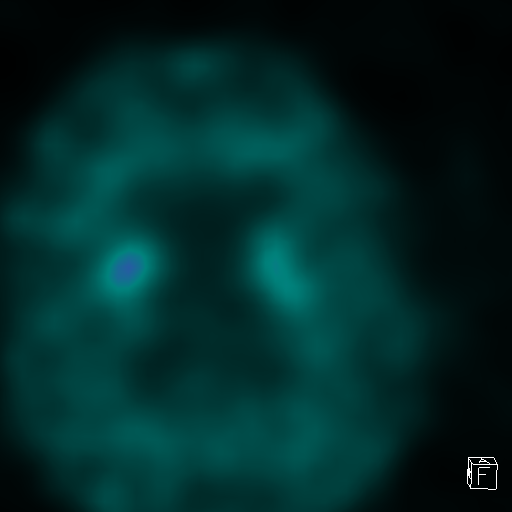
[im 18/35]
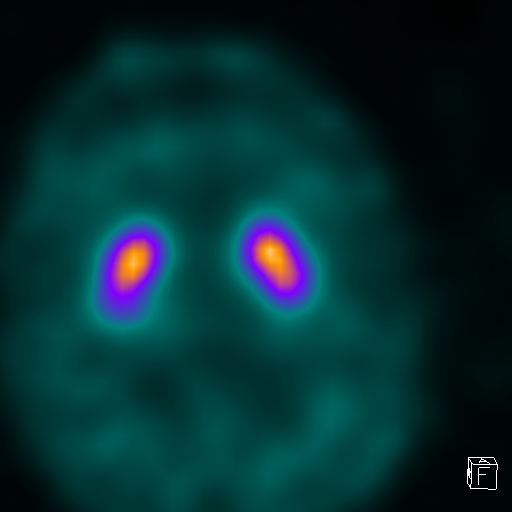
[im 20/35]
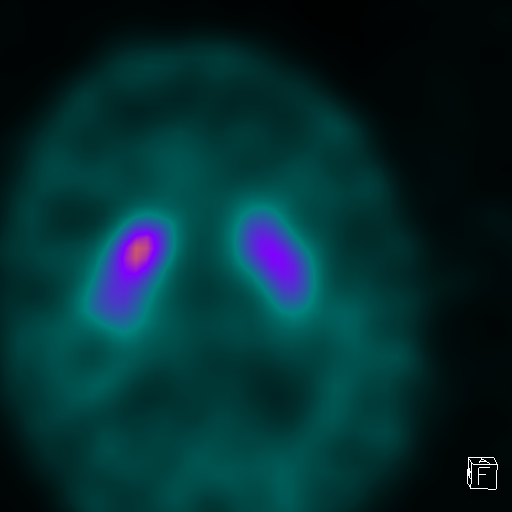
[im 21/35]
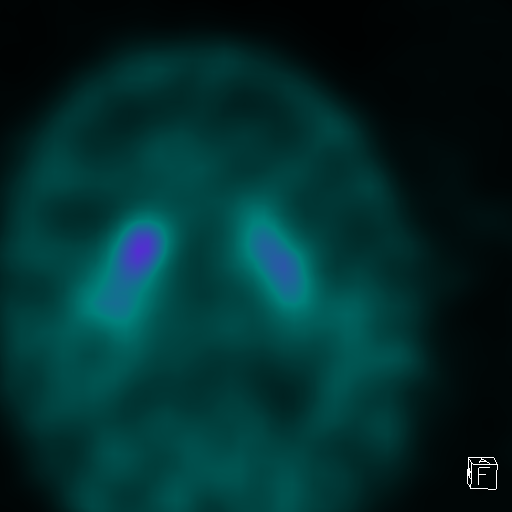
[im 23/35]
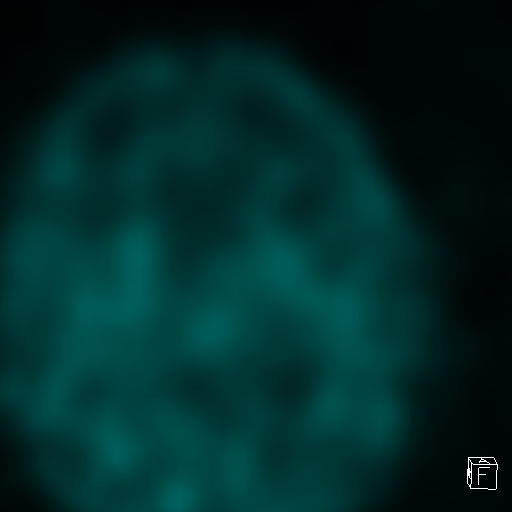
[im 26/35]
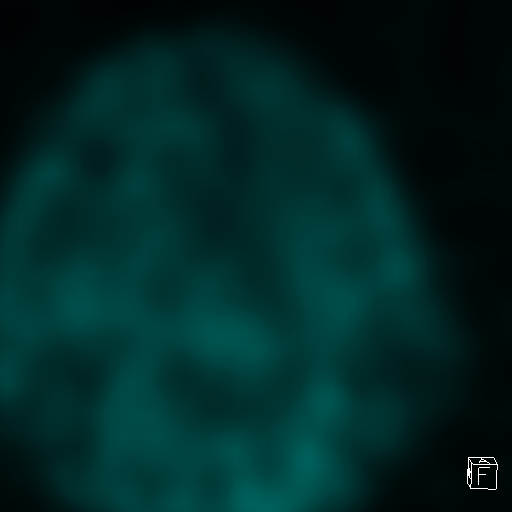
[im 27/35]
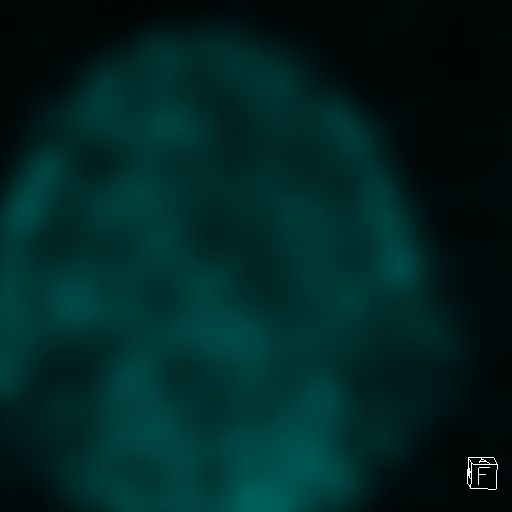
[im 29/35]
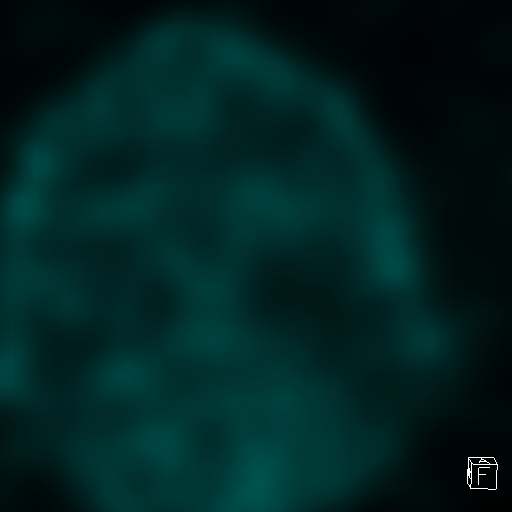
[im 30/35]
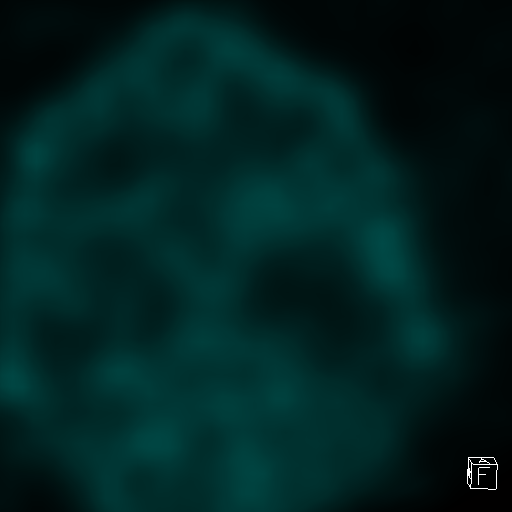
[im 33/35]
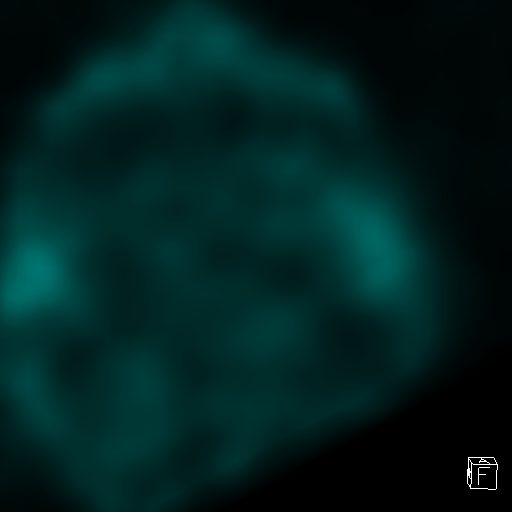
[im 35/35]
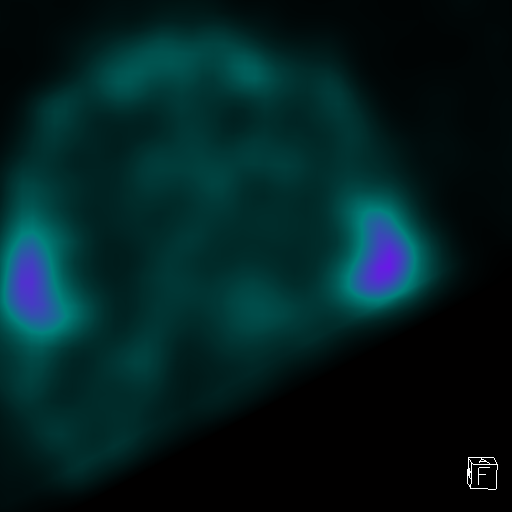

[24 of 30 positions shown; findings below may reference images not displayed]

FINDINGS: Symmetric intense uptake within LEFT and RIGHT striata. The heads of
the caudate nuclei and the posterior striata (putamen) are normal
shape. No evidence of loss of dopamine transport populations in the
basal ganglia.
IMPRESSION: Normal Ioflupane scan. No reduced radiotracer activity in basal
ganglia to suggest Parkinson's syndrome pathology.

Of note, DaTSCAN is not diagnostic of Parkinsonian syndromes, which
remains a clinical diagnosis. DaTscan is an adjuvant test to aid in
the clinical diagnosis of Parkinsonian syndromes.

## 2022-02-16 DIAGNOSIS — E1022 Type 1 diabetes mellitus with diabetic chronic kidney disease: Secondary | ICD-10-CM | POA: Diagnosis not present

## 2022-02-16 DIAGNOSIS — I4721 Torsades de pointes: Secondary | ICD-10-CM | POA: Diagnosis not present

## 2022-02-16 DIAGNOSIS — I130 Hypertensive heart and chronic kidney disease with heart failure and stage 1 through stage 4 chronic kidney disease, or unspecified chronic kidney disease: Secondary | ICD-10-CM | POA: Diagnosis not present

## 2022-02-16 DIAGNOSIS — D509 Iron deficiency anemia, unspecified: Secondary | ICD-10-CM | POA: Diagnosis not present

## 2022-02-16 DIAGNOSIS — E441 Mild protein-calorie malnutrition: Secondary | ICD-10-CM | POA: Diagnosis not present

## 2022-02-16 DIAGNOSIS — Z8701 Personal history of pneumonia (recurrent): Secondary | ICD-10-CM | POA: Diagnosis not present

## 2022-02-16 DIAGNOSIS — I5032 Chronic diastolic (congestive) heart failure: Secondary | ICD-10-CM | POA: Diagnosis not present

## 2022-02-16 DIAGNOSIS — I480 Paroxysmal atrial fibrillation: Secondary | ICD-10-CM | POA: Diagnosis not present

## 2022-02-16 DIAGNOSIS — N400 Enlarged prostate without lower urinary tract symptoms: Secondary | ICD-10-CM | POA: Diagnosis not present

## 2022-02-16 DIAGNOSIS — Z872 Personal history of diseases of the skin and subcutaneous tissue: Secondary | ICD-10-CM | POA: Diagnosis not present

## 2022-02-16 DIAGNOSIS — R1310 Dysphagia, unspecified: Secondary | ICD-10-CM | POA: Diagnosis not present

## 2022-02-16 DIAGNOSIS — Z8619 Personal history of other infectious and parasitic diseases: Secondary | ICD-10-CM | POA: Diagnosis not present

## 2022-02-16 DIAGNOSIS — N183 Chronic kidney disease, stage 3 unspecified: Secondary | ICD-10-CM | POA: Diagnosis not present

## 2022-02-16 DIAGNOSIS — R634 Abnormal weight loss: Secondary | ICD-10-CM | POA: Diagnosis not present

## 2022-02-16 DIAGNOSIS — I2510 Atherosclerotic heart disease of native coronary artery without angina pectoris: Secondary | ICD-10-CM | POA: Diagnosis not present

## 2022-02-16 DIAGNOSIS — E039 Hypothyroidism, unspecified: Secondary | ICD-10-CM | POA: Diagnosis not present

## 2022-02-16 DIAGNOSIS — M1990 Unspecified osteoarthritis, unspecified site: Secondary | ICD-10-CM | POA: Diagnosis not present

## 2022-02-18 ENCOUNTER — Other Ambulatory Visit: Payer: Medicare Other

## 2022-02-18 ENCOUNTER — Ambulatory Visit: Payer: Medicare Other | Admitting: Oncology

## 2022-03-05 ENCOUNTER — Ambulatory Visit: Payer: Medicare Other | Admitting: Sports Medicine

## 2022-03-18 DEATH — deceased

## 2022-07-05 ENCOUNTER — Ambulatory Visit: Payer: Medicare Other | Admitting: Neurology

## 2022-09-04 IMAGING — DX DG CHEST 1V PORT
1 series · 1 of 1 positions shown · non-contrast
Comparison: October 06, 2021

CLINICAL DATA: Fever and decreased appetite.

EXAM:
PORTABLE CHEST 1 VIEW

[chest ap]
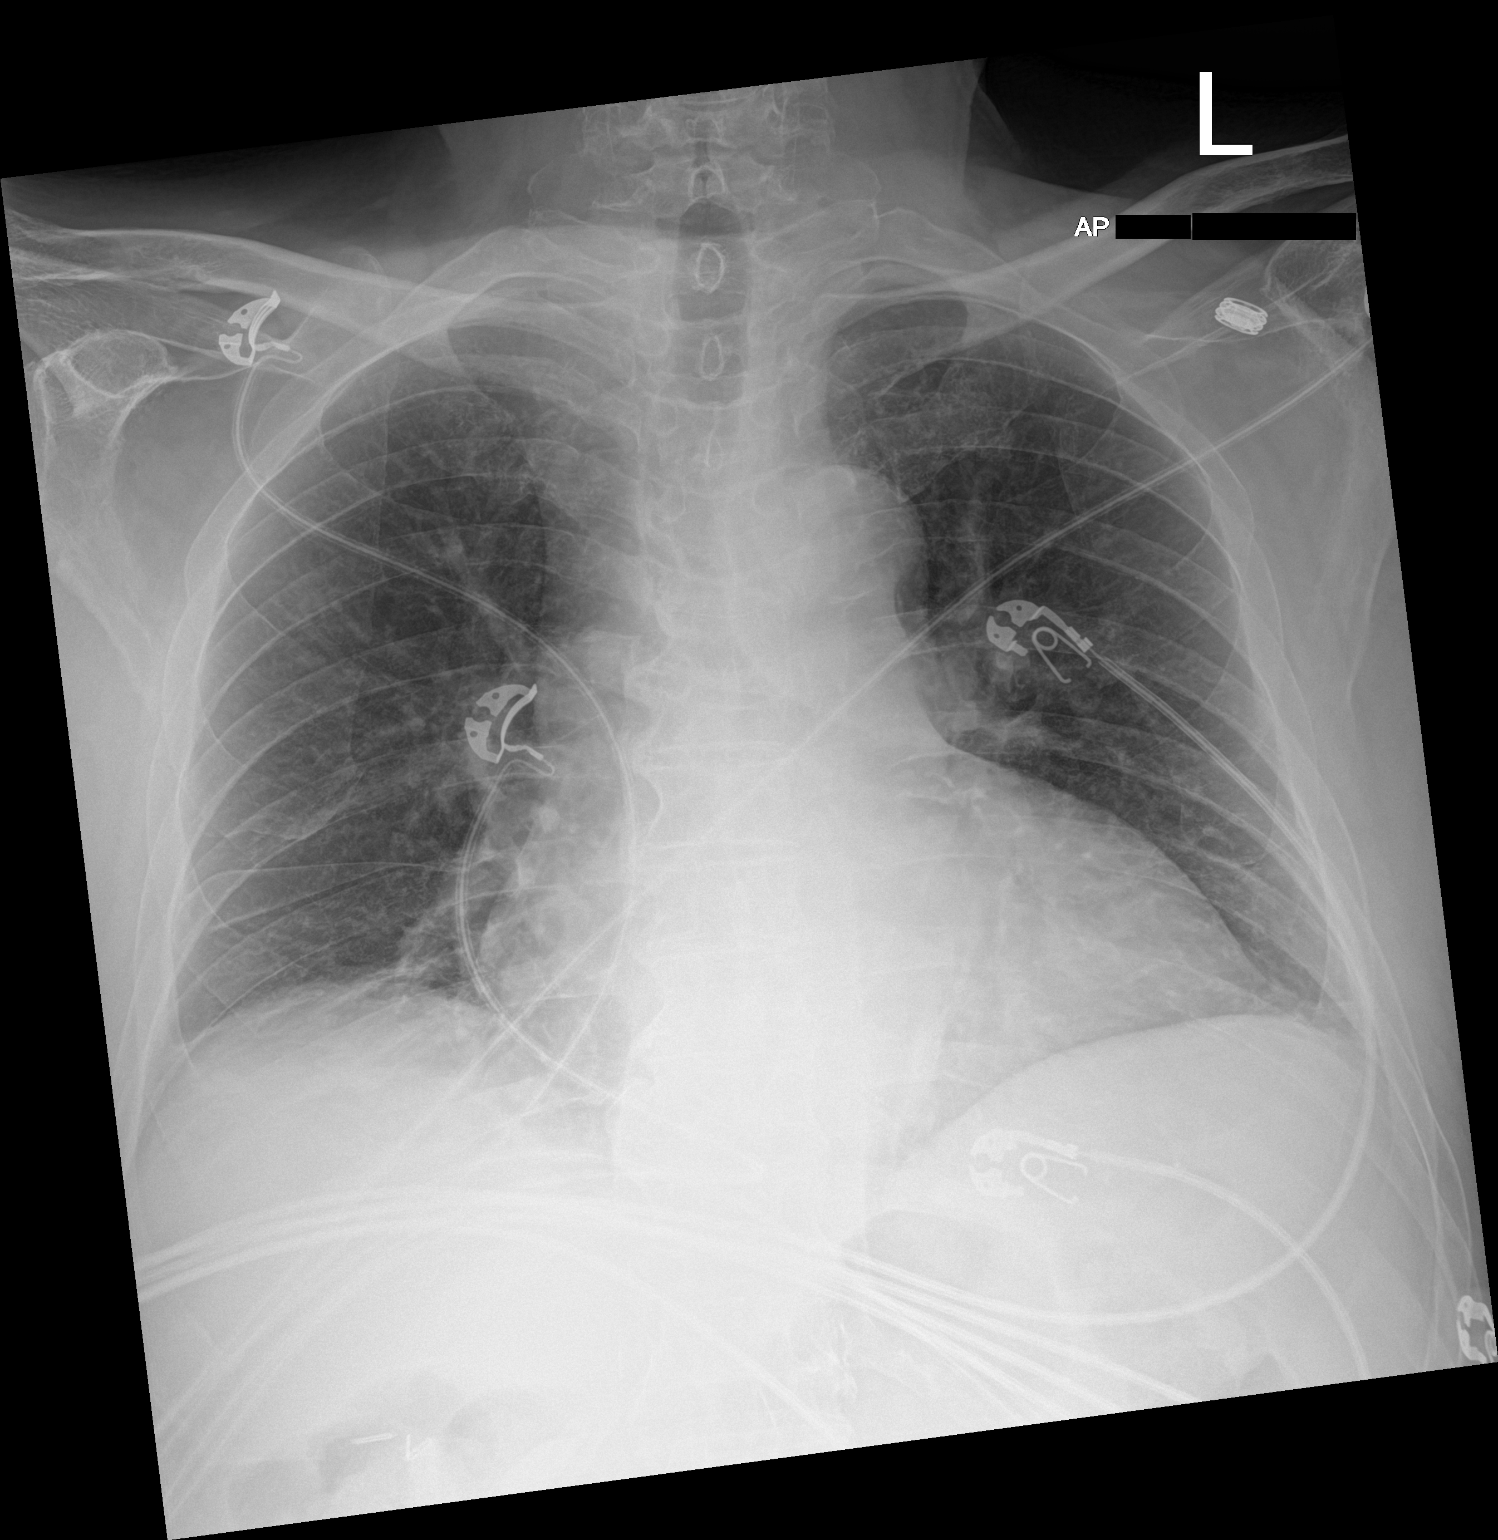

[1 of 1 positions shown; findings below may reference images not displayed]

FINDINGS: Mild atelectasis is seen within the bilateral lung bases. There is
no evidence of acute infiltrate, pleural effusion or pneumothorax.
The cardiac silhouette is mildly enlarged and unchanged in size. The
visualized skeletal structures are unremarkable.
IMPRESSION: Stable cardiomegaly with mild bibasilar atelectasis.

## 2022-09-04 IMAGING — DX DG HAND COMPLETE 3+V*R*
1 series · 3 of 3 positions shown · non-contrast
Comparison: None.

CLINICAL DATA: Fall, concern for fracture.

EXAM:
RIGHT HAND - COMPLETE 3+ VIEW

[Series 1: hand · 0.14mm/px · 3 of 3 slices shown]
[im 1/3]
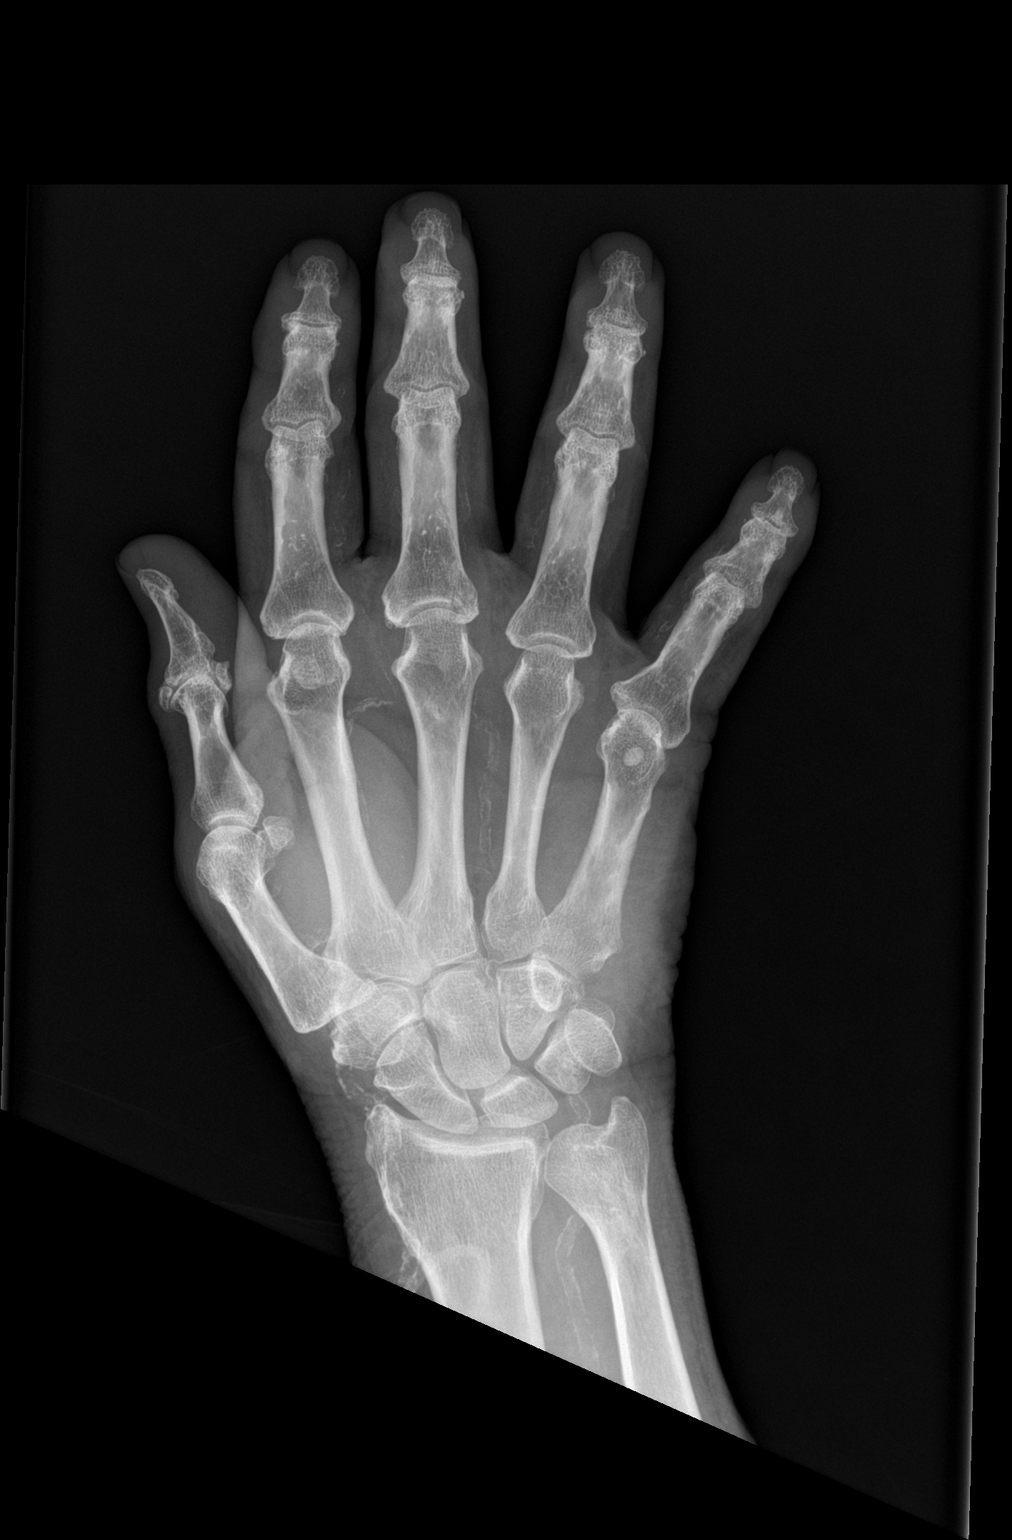
[im 2/3]
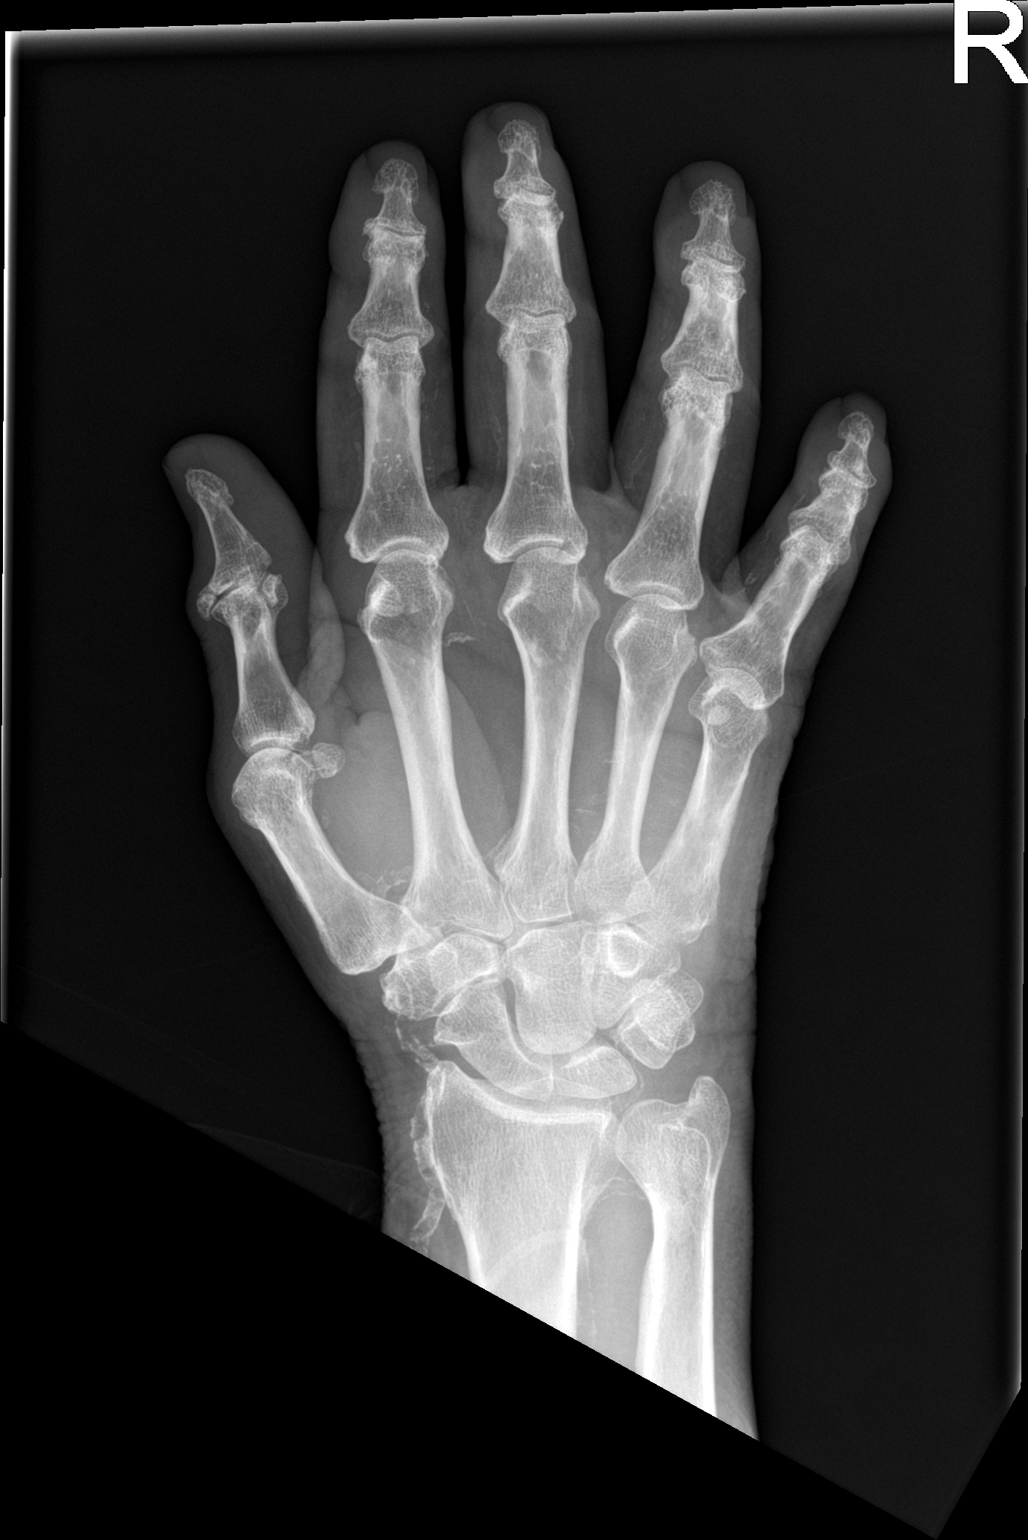
[im 3/3]
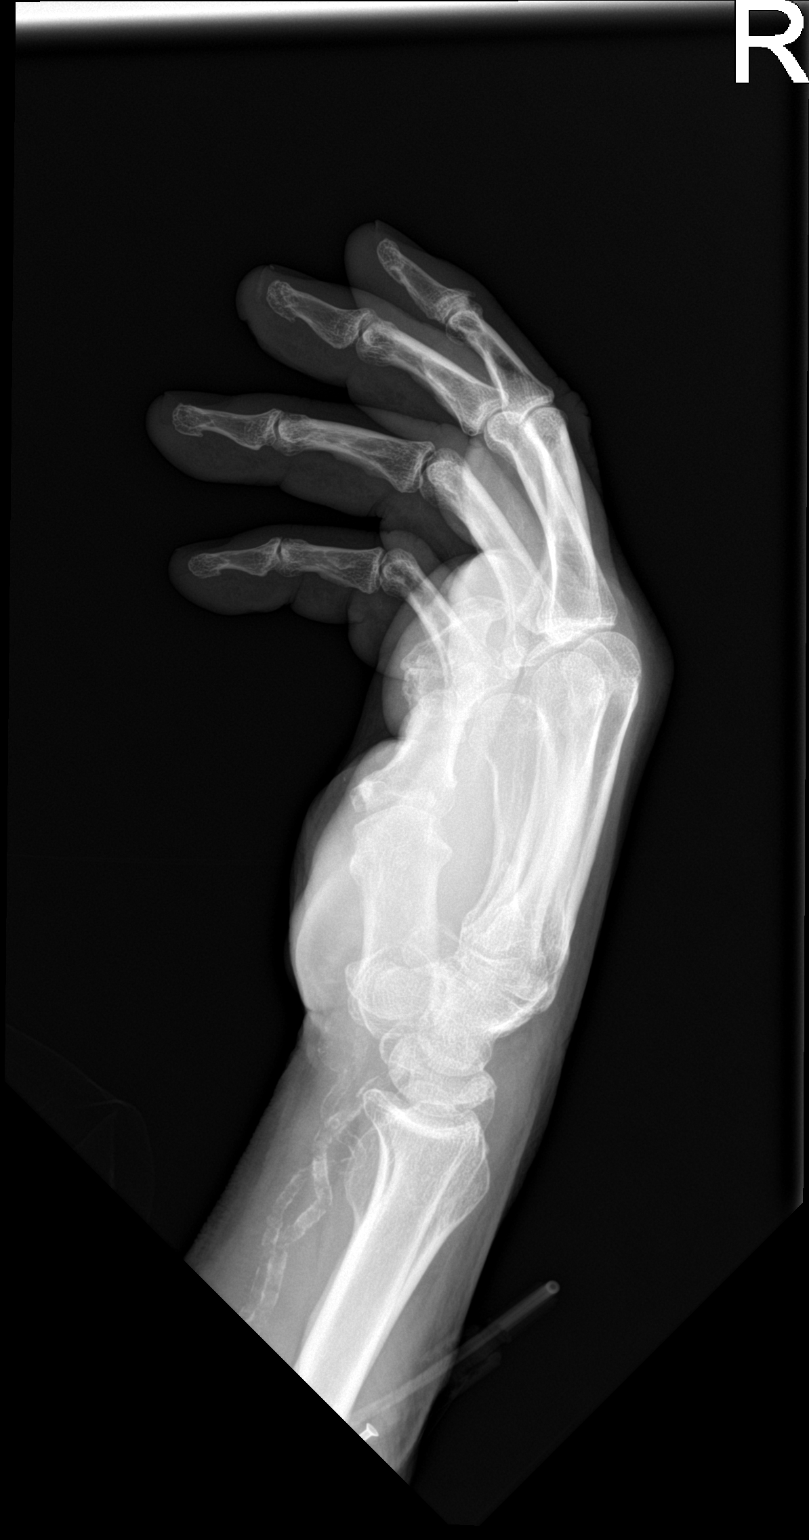

[3 of 3 positions shown; findings below may reference images not displayed]

FINDINGS: There is a nondisplaced fracture at the base of the proximal phalanx
of the third digit. The remaining bony structures appear intact.
There is no dislocation. Joint space narrowing and osteophyte
formation is noted at the interphalangeal joints and first
carpometacarpal joint. Extensive vascular calcifications are
present. There is soft tissue swelling over the third metacarpal
phalangeal joint.
IMPRESSION: Nondisplaced fracture at the base of the proximal phalanx of the
third digit.

## 2022-09-04 IMAGING — CT CT ABD-PELV W/ CM
2 of 5 series · 16 of 46 positions shown, 18 images · IV contrast (APPLIED)
Comparison: August 20, 2021

CLINICAL DATA: Sacral wound infection.

EXAM:
CT ABDOMEN AND PELVIS WITH CONTRAST
TECHNIQUE: Multidetector CT imaging of the abdomen and pelvis was performed
using the standard protocol following bolus administration of
intravenous contrast.
CONTRAST:  100mL OMNIPAQUE IOHEXOL 300 MG/ML  SOLN

[Series 3: abdomen 5.0 · axial · 0.98mm/px · z∈[+664,+1119]mm · 13 of 107 slices shown, 15 images]
[im 8/107  soft-tissue]
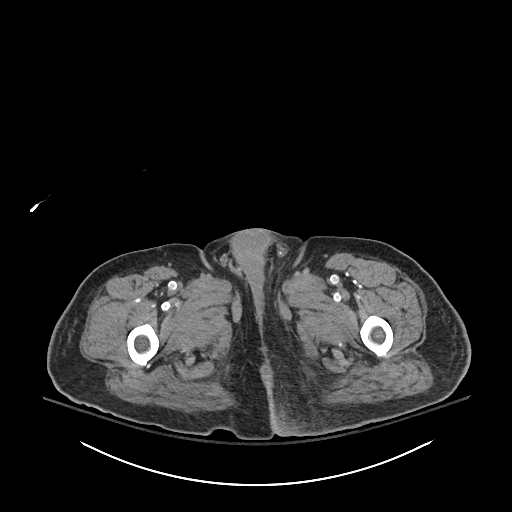
[im 8/107  bone]
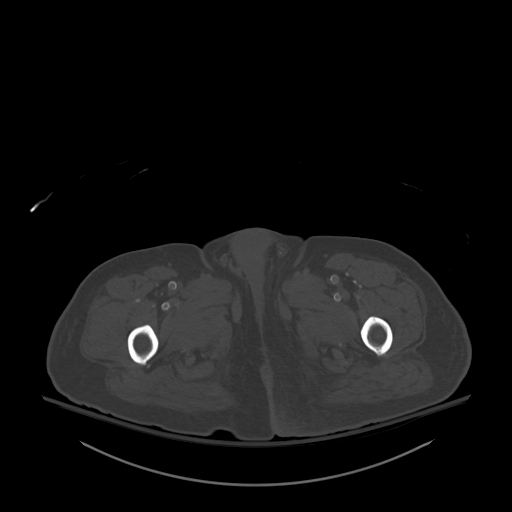
[im 15/107  soft-tissue]
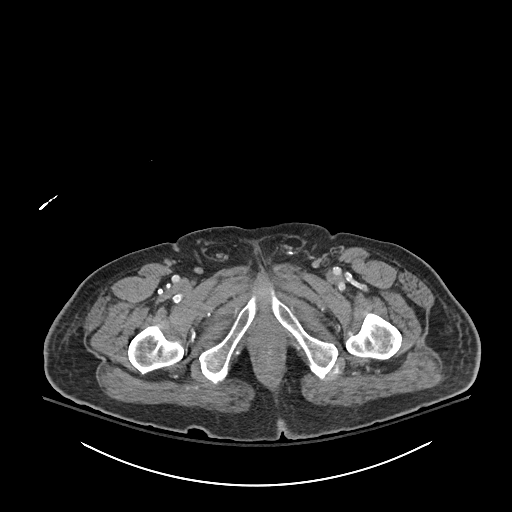
[im 22/107  soft-tissue]
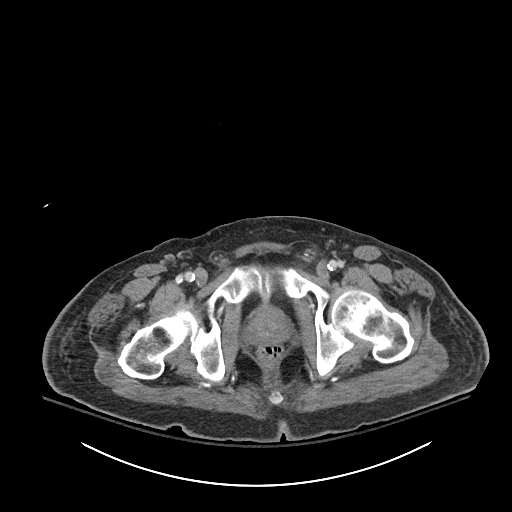
[im 29/107  soft-tissue]
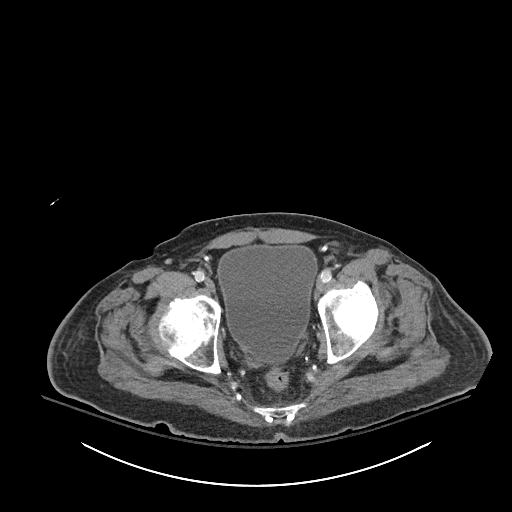
[im 36/107  soft-tissue]
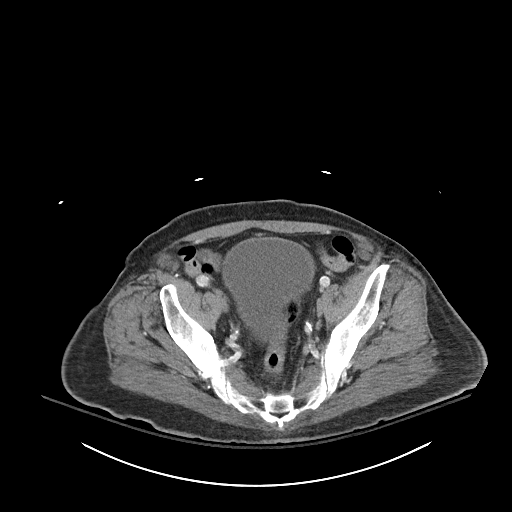
[im 43/107  soft-tissue]
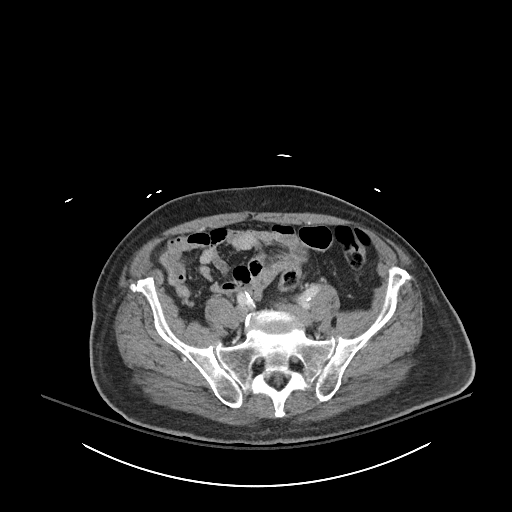
[im 57/107  soft-tissue]
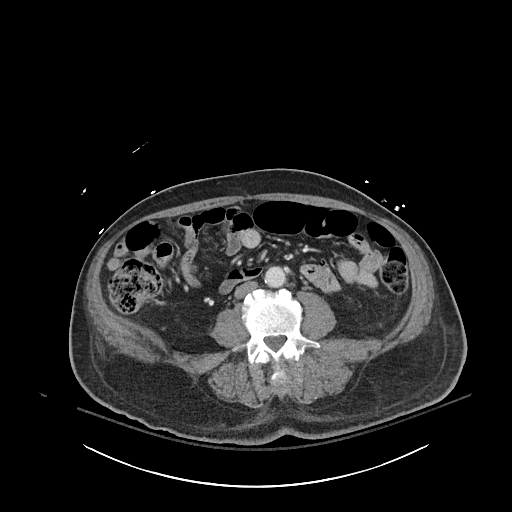
[im 64/107  soft-tissue]
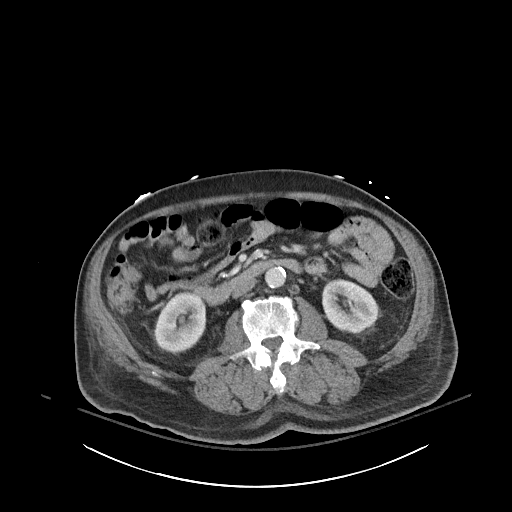
[im 71/107  soft-tissue]
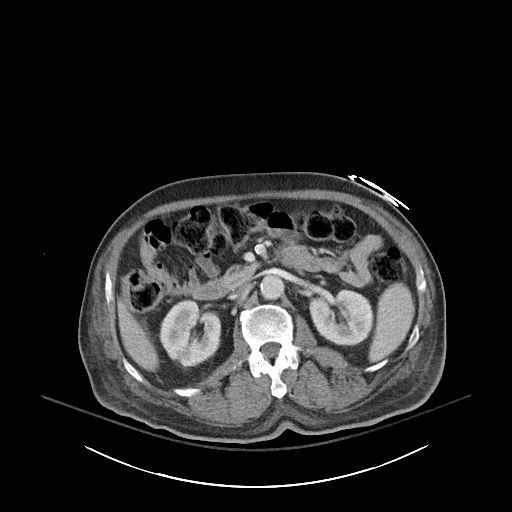
[im 71/107  bone]
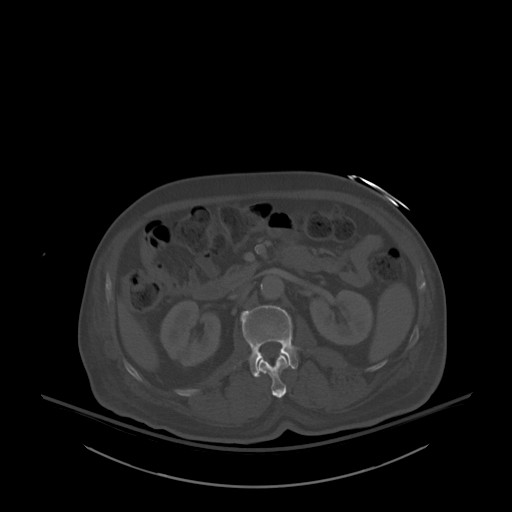
[im 78/107  soft-tissue]
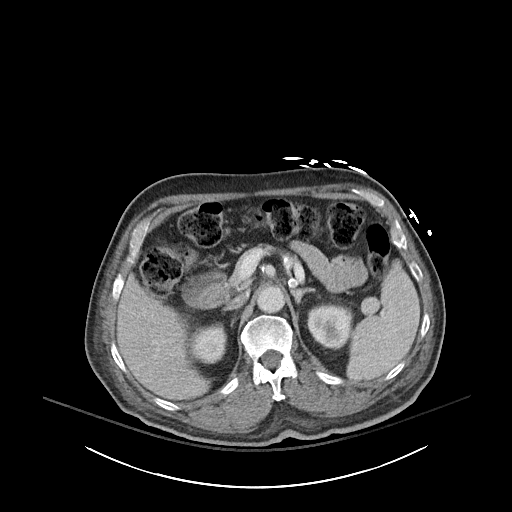
[im 85/107  soft-tissue]
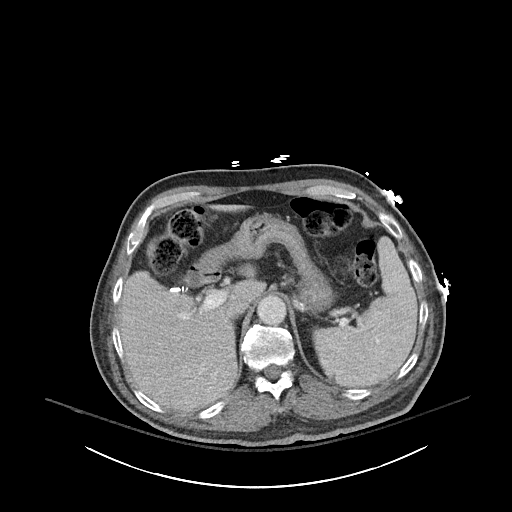
[im 92/107  soft-tissue]
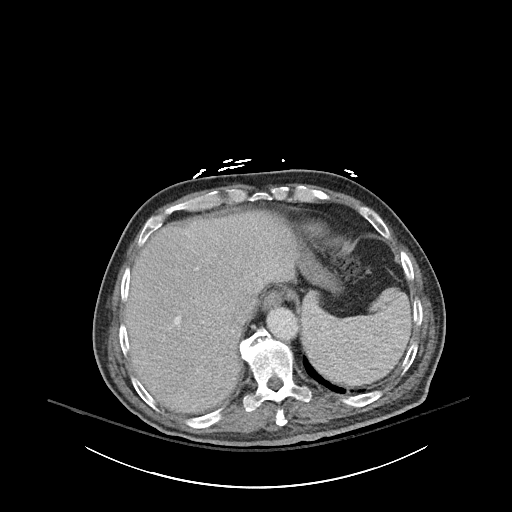
[im 99/107  soft-tissue]
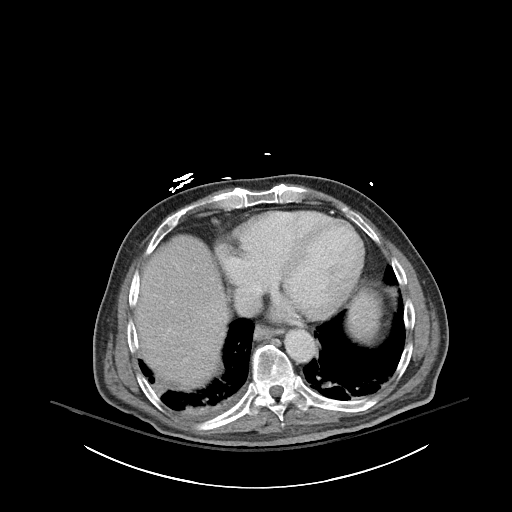

[Series 6: abdomen 3.0 mpr cor · coronal · 0.86mm/px · 3 of 94 slices shown]
[im 32/94  soft-tissue]
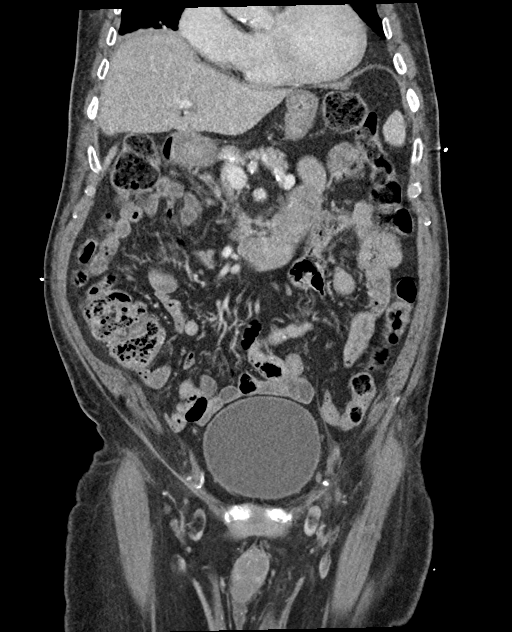
[im 42/94  soft-tissue]
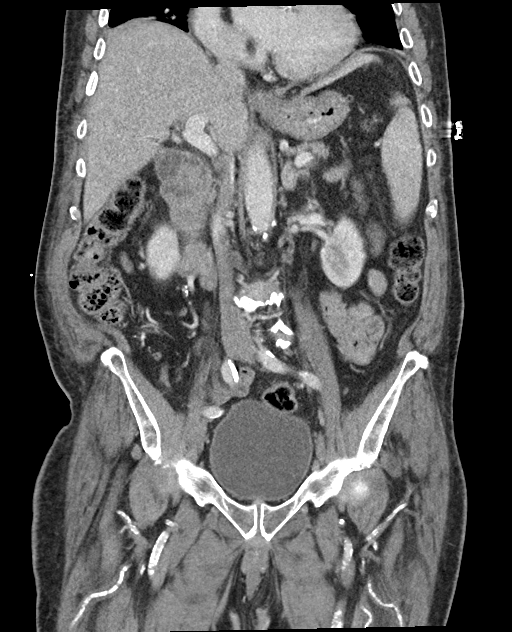
[im 52/94  soft-tissue]
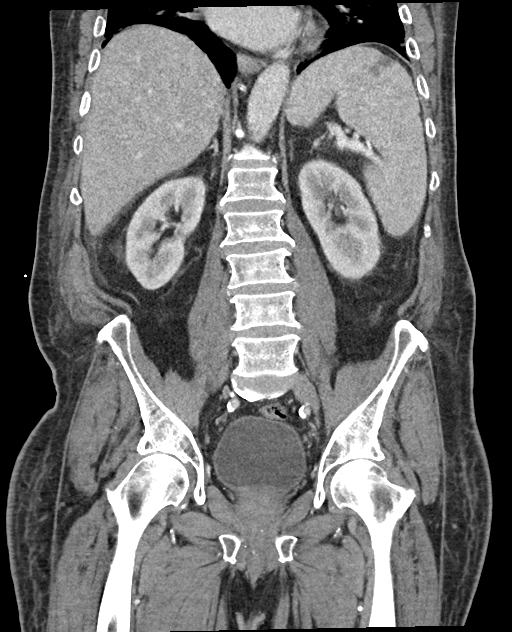

[16 of 46 positions shown; findings below may reference images not displayed]

FINDINGS: Lower chest: Moderate severity atelectasis is seen within the
posterior aspect of the bilateral lung bases.

There is a very small right pleural effusion.

Hepatobiliary: No focal liver abnormality is seen. Status post
cholecystectomy. No biliary dilatation.

Pancreas: Unremarkable. No pancreatic ductal dilatation or
surrounding inflammatory changes.

Spleen: A 3.3 cm x 2.1 cm area of low attenuation is seen along the
anterior tip of an enlarged spleen.

Adrenals/Urinary Tract: Adrenal glands are unremarkable. Kidneys are
normal in size, without renal calculi or hydronephrosis. A 1.6 cm
diameter cystic appearing area is seen along the upper pole of the
left kidney. Urinary bladder is moderately distended.

Stomach/Bowel: Stomach is within normal limits. Appendix appears
normal. No evidence of bowel wall thickening, distention, or
inflammatory changes.

Vascular/Lymphatic: Aortic atherosclerosis. No enlarged abdominal or
pelvic lymph nodes.

Reproductive: Prostate is unremarkable.

Other: No abdominal wall hernia or abnormality. No abdominopelvic
ascites.

Musculoskeletal: A small superficial soft tissue defect is seen at
the level of the lower sacrum, along the midline. A mild amount of
associated soft tissue thickening is seen.

Mild subcutaneous inflammatory fat stranding is seen along the
posteromedial aspect of the gluteal region on the left.

No acute osseous abnormality is identified.
IMPRESSION: 1. Small superficial sacral also, without evidence of associated
fluid collection, abscess or acute osteomyelitis.
2. Mild cellulitis along the posteromedial aspect of the gluteal
region on the left.
3. Moderate severity bibasilar atelectasis with a very small right
pleural effusion.
4. Splenomegaly with additional findings that may represent sequelae
associated with a small splenic infarct.
5. Aortic atherosclerosis.

Aortic Atherosclerosis ([id number]).

## 2022-09-27 ENCOUNTER — Ambulatory Visit: Payer: Medicare Other | Admitting: Neurology
